# Patient Record
Sex: Male | Born: 1960
Health system: Southern US, Community
[De-identification: ages and names within clinical notes are randomized; demographics above are authoritative.]

## PROBLEM LIST (undated history)

## (undated) ENCOUNTER — Emergency Department (HOSPITAL_COMMUNITY): Discharge status: Against Medical Advice | Payer: Managed Care, Other (non HMO)

## (undated) DIAGNOSIS — M1990 Unspecified osteoarthritis, unspecified site: Secondary | ICD-10-CM

## (undated) DIAGNOSIS — E785 Hyperlipidemia, unspecified: Secondary | ICD-10-CM

## (undated) DIAGNOSIS — I10 Essential (primary) hypertension: Secondary | ICD-10-CM

## (undated) DIAGNOSIS — K8020 Calculus of gallbladder without cholecystitis without obstruction: Secondary | ICD-10-CM

## (undated) DIAGNOSIS — R972 Elevated prostate specific antigen [PSA]: Secondary | ICD-10-CM

## (undated) DIAGNOSIS — Z8719 Personal history of other diseases of the digestive system: Secondary | ICD-10-CM

## (undated) DIAGNOSIS — Z87442 Personal history of urinary calculi: Secondary | ICD-10-CM

## (undated) DIAGNOSIS — I499 Cardiac arrhythmia, unspecified: Secondary | ICD-10-CM

## (undated) DIAGNOSIS — G4730 Sleep apnea, unspecified: Secondary | ICD-10-CM

## (undated) DIAGNOSIS — K219 Gastro-esophageal reflux disease without esophagitis: Secondary | ICD-10-CM

## (undated) DIAGNOSIS — N200 Calculus of kidney: Secondary | ICD-10-CM

## (undated) DIAGNOSIS — G709 Myoneural disorder, unspecified: Secondary | ICD-10-CM

## (undated) DIAGNOSIS — C801 Malignant (primary) neoplasm, unspecified: Secondary | ICD-10-CM

## (undated) HISTORY — PX: LITHOTRIPSY: SUR834

## (undated) HISTORY — PX: CARDIAC CATHETERIZATION: SHX172

## (undated) HISTORY — PX: NASAL SINUS SURGERY: SHX719

---

## 2000-12-19 ENCOUNTER — Other Ambulatory Visit: Admission: RE | Admit: 2000-12-19 | Discharge: 2000-12-19 | Payer: Self-pay | Admitting: Gastroenterology

## 2000-12-19 ENCOUNTER — Encounter (INDEPENDENT_AMBULATORY_CARE_PROVIDER_SITE_OTHER): Payer: Self-pay | Admitting: Specialist

## 2000-12-26 ENCOUNTER — Encounter: Payer: Self-pay | Admitting: Gastroenterology

## 2000-12-26 ENCOUNTER — Ambulatory Visit (HOSPITAL_COMMUNITY): Admission: RE | Admit: 2000-12-26 | Discharge: 2000-12-26 | Payer: Self-pay | Admitting: Gastroenterology

## 2003-08-03 ENCOUNTER — Ambulatory Visit (HOSPITAL_COMMUNITY): Admission: RE | Admit: 2003-08-03 | Discharge: 2003-08-03 | Payer: Self-pay | Admitting: Pulmonary Disease

## 2003-08-11 ENCOUNTER — Ambulatory Visit (HOSPITAL_COMMUNITY): Admission: RE | Admit: 2003-08-11 | Discharge: 2003-08-11 | Payer: Self-pay | Admitting: Pulmonary Disease

## 2005-10-03 ENCOUNTER — Ambulatory Visit (HOSPITAL_COMMUNITY): Admission: RE | Admit: 2005-10-03 | Discharge: 2005-10-03 | Payer: Self-pay | Admitting: Otolaryngology

## 2005-10-22 ENCOUNTER — Ambulatory Visit (HOSPITAL_COMMUNITY): Admission: RE | Admit: 2005-10-22 | Discharge: 2005-10-22 | Payer: Self-pay | Admitting: Otolaryngology

## 2005-11-15 ENCOUNTER — Ambulatory Visit (HOSPITAL_COMMUNITY): Admission: RE | Admit: 2005-11-15 | Discharge: 2005-11-15 | Payer: Self-pay | Admitting: Otolaryngology

## 2005-11-22 ENCOUNTER — Ambulatory Visit (HOSPITAL_COMMUNITY): Admission: RE | Admit: 2005-11-22 | Discharge: 2005-11-22 | Payer: Self-pay | Admitting: Otolaryngology

## 2006-08-11 IMAGING — CT CT PARANASAL SINUSES LIMITED
1 series · 11 of 14 positions shown, 14 images · non-contrast
Comparison: none

HISTORY: Chronic recurrent sinusitis

[Series 988: — · axial · 0.35mm/px · z∈[-596,-486]mm · 11 of 14 slices shown, 14 images]
[im 2/14  brain]
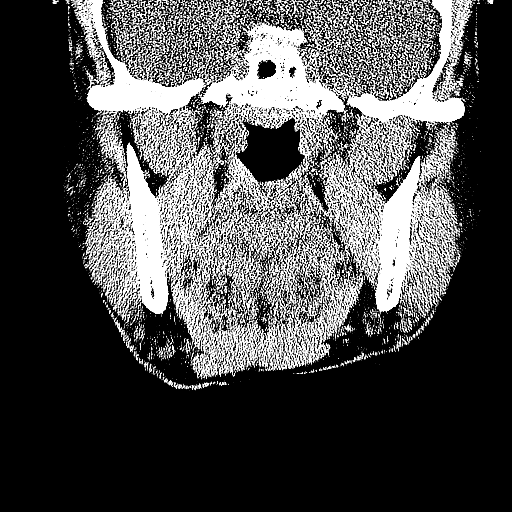
[im 2/14  bone]
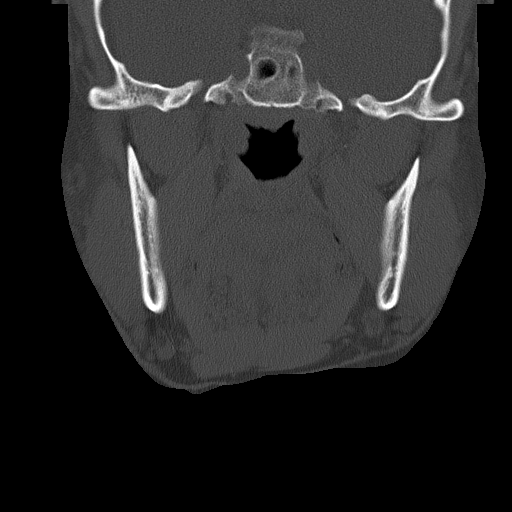
[im 3/14  bone]
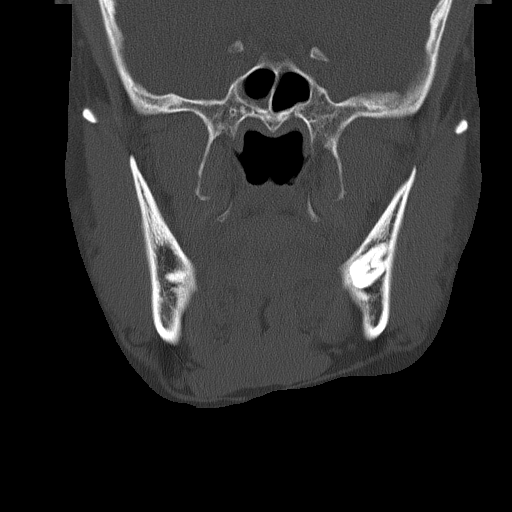
[im 4/14  bone]
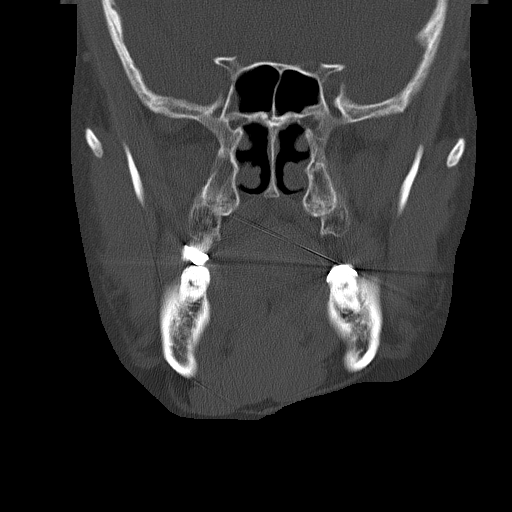
[im 5/14  bone]
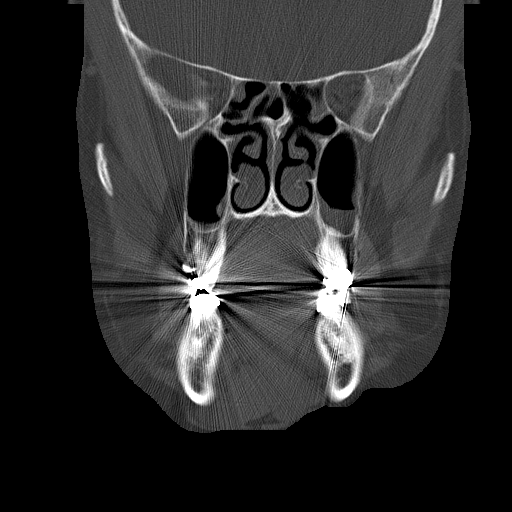
[im 6/14  brain]
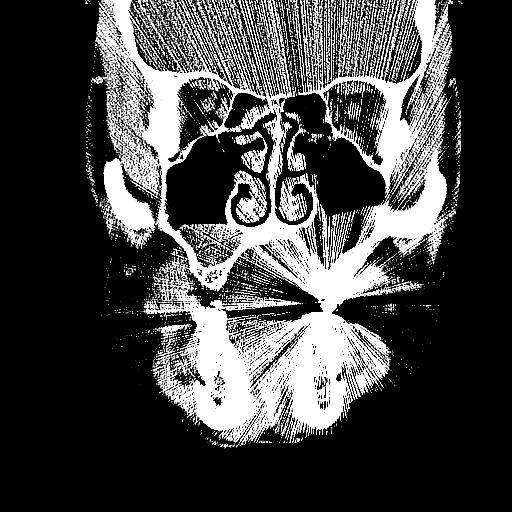
[im 6/14  bone]
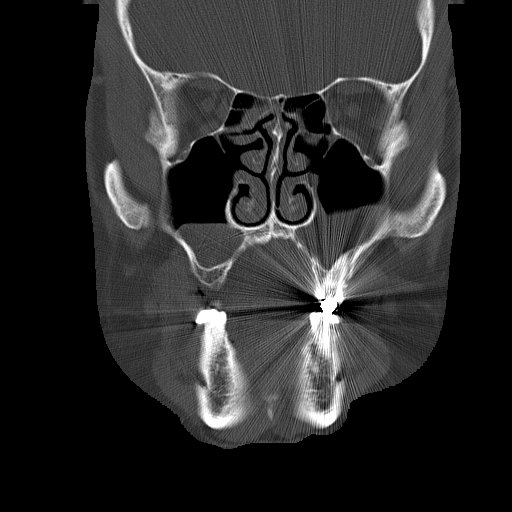
[im 7/14  bone]
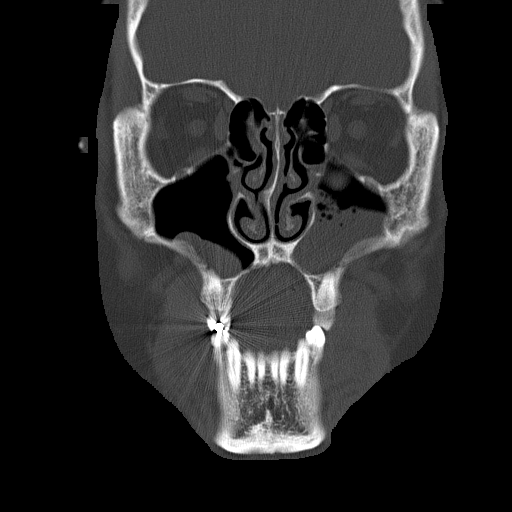
[im 8/14  bone]
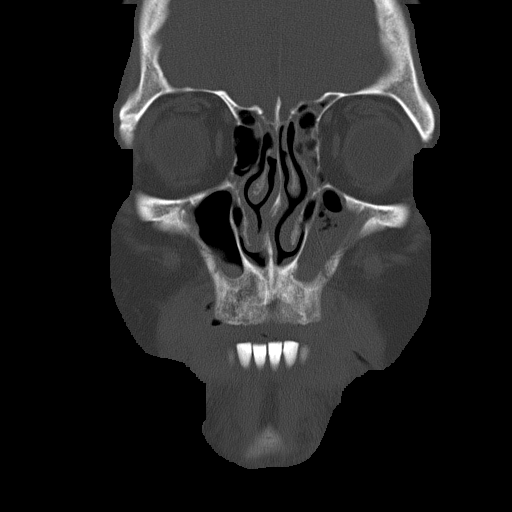
[im 9/14  bone]
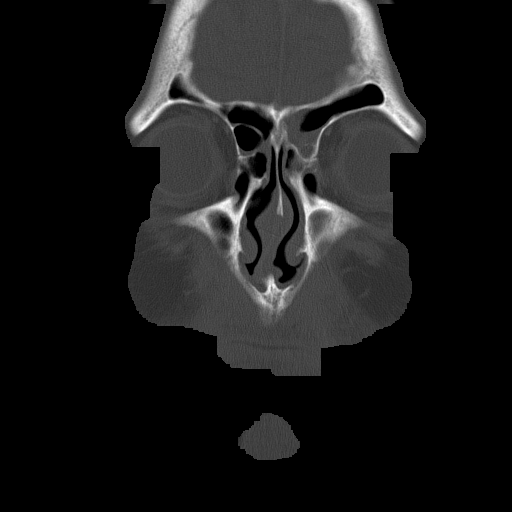
[im 10/14  brain]
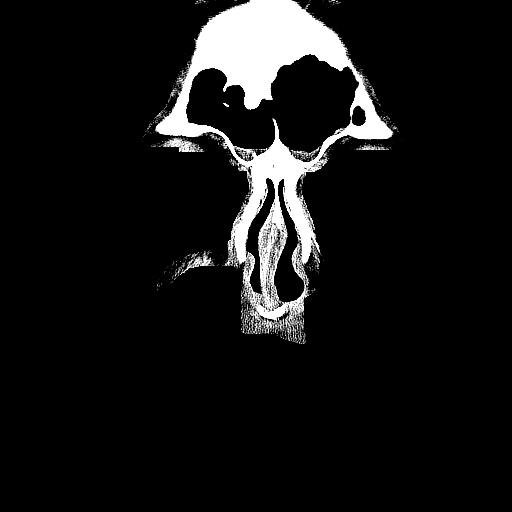
[im 10/14  bone]
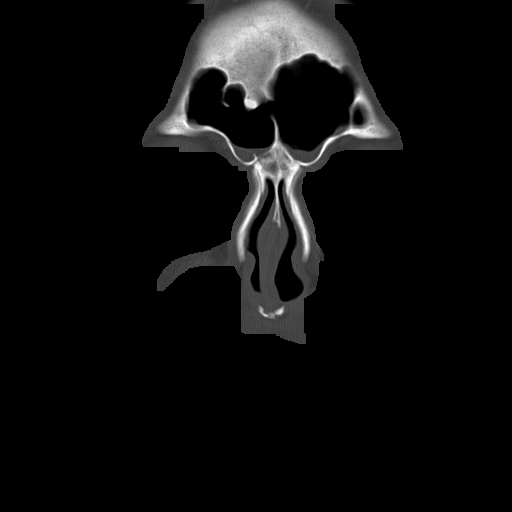
[im 11/14  bone]
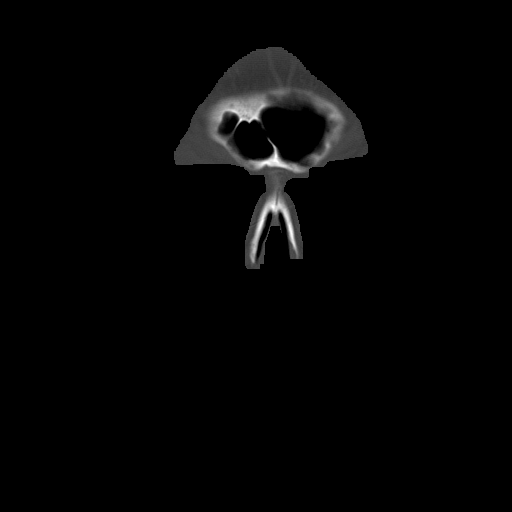
[im 13/14  bone]
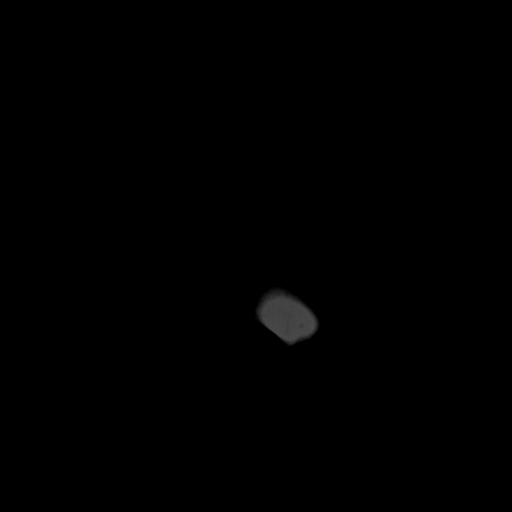

[11 of 14 positions shown; findings below may reference images not displayed]

CT SINUSES LIMITED WITHOUT CONTRAST:

Coronal non-contrast noncontiguous CT imaging paranasal sinuses compared to
10/22/2005.
Beam hardening artifacts from dental fillings.
Right side of face marked with BB.

Mucosal retention cyst the floor of right maxillary sinus.
Mucosal retention cyst left maxillary sinus with new air-fluid level compatible
with acute sinusitis.
Mucosal thickening in left ethmoid air cells and left frontal sinus with small
amounts of fluid dependently in frontal sinus bilaterally.
Nasal septum minimally deviated to left.
Sphenoid sinus with minimal dependent fluid.
No fracture or bone destruction.
IMPRESSION: Scattered sinus disease changes with fluid levels in frontal and left maxillary
sinus compatible with acute sinusitis.

## 2006-08-21 ENCOUNTER — Emergency Department (HOSPITAL_COMMUNITY): Admission: EM | Admit: 2006-08-21 | Discharge: 2006-08-21 | Payer: Self-pay | Admitting: Emergency Medicine

## 2010-06-06 ENCOUNTER — Emergency Department (HOSPITAL_BASED_OUTPATIENT_CLINIC_OR_DEPARTMENT_OTHER): Admission: EM | Admit: 2010-06-06 | Discharge: 2010-06-06 | Payer: Self-pay | Admitting: Emergency Medicine

## 2010-06-07 ENCOUNTER — Encounter (INDEPENDENT_AMBULATORY_CARE_PROVIDER_SITE_OTHER): Payer: Self-pay | Admitting: *Deleted

## 2010-06-21 ENCOUNTER — Telehealth: Payer: Self-pay | Admitting: Gastroenterology

## 2010-11-27 NOTE — Letter (Signed)
Summary: Colonoscopy Letter  [facility]  [address] [place], [place] [address]   Phone: [phone]  Fax: [phone]      June 07, 2010 MRN: [id number]   [name] [address] [place], [place]  [address]   Dear Mr. PHILLIPPI,   According to your medical record, it is time for you to schedule a Colonoscopy. The American Cancer Society recommends this procedure as a method to detect early colon cancer. Patients with a family history of colon cancer, or a personal history of colon polyps or inflammatory bowel disease are at increased risk.  This letter has beeen generated based on the recommendations made at the time of your procedure. If you feel that in your particular situation this may no longer apply, please contact our office.  Please call our office at [phone] to schedule this appointment or to update your records at your earliest convenience.  Thank you for cooperating with [place] to provide you with the very best care possible.   Sincerely,  Judie Petit T. Russella Dar, M.D.  [facility] [phone]

## 2010-11-27 NOTE — Progress Notes (Signed)
Summary: Schedule Colonoscopy  Phone Note Outgoing Call Call back at [facility] Phone [phone]   Call placed by: Harlow Mares CMA Duncan Dull),  June 21, 2010 3:14 PM Call placed to: Patient Summary of Call: Left a message on patients machine to call back. patient is due for his colonoscopy Initial call taken by: Harlow Mares CMA Duncan Dull),  June 21, 2010 3:14 PM  Follow-up for Phone Call        Left a message on the patient machine to call back and schedule a previsit and procedure with our office. A letter will be mailed to the patient.   Follow-up by: Harlow Mares CMA Duncan Dull),  June 28, 2010 2:58 PM

## 2010-12-11 ENCOUNTER — Encounter: Payer: Self-pay | Admitting: Internal Medicine

## 2010-12-19 NOTE — Letter (Signed)
Summary: [facility] TRIAGE  [facility] TRIAGE   Imported By: Rexene Alberts 12/11/2010 10:31:48  _____________________________________________________________________  External Attachment:    Type:   Image     Comment:   External Document  Appended Document: [facility] TRIAGE ok as is  Appended Document: [facility] TRIAGE Rx and instuctions mailed.

## 2010-12-24 ENCOUNTER — Encounter: Payer: Self-pay | Admitting: Internal Medicine

## 2010-12-24 ENCOUNTER — Ambulatory Visit (HOSPITAL_COMMUNITY)
Admission: RE | Admit: 2010-12-24 | Discharge: 2010-12-24 | Disposition: A | Discharge status: Home / Self Care | Payer: Managed Care, Other (non HMO) | Source: Ambulatory Visit | Attending: Internal Medicine | Admitting: Internal Medicine

## 2010-12-24 ENCOUNTER — Encounter: Payer: Managed Care, Other (non HMO) | Admitting: Internal Medicine

## 2010-12-24 DIAGNOSIS — Z1211 Encounter for screening for malignant neoplasm of colon: Secondary | ICD-10-CM

## 2010-12-24 DIAGNOSIS — Z8371 Family history of colonic polyps: Secondary | ICD-10-CM

## 2010-12-24 DIAGNOSIS — Z83719 Family history of colon polyps, unspecified: Secondary | ICD-10-CM | POA: Insufficient documentation

## 2011-01-02 NOTE — Op Note (Addendum)
  NAME:  Anthony Anderson, Anthony Anderson                ACCOUNT NO.:  [id number]  MEDICAL RECORD NO.:  [id number]           PATIENT TYPE:  O  LOCATION:  [facility]                          FACILITY:  [facility]  PHYSICIAN:  R. Roetta Sessions, M.D. DATE OF BIRTH:  [birth year]  DATE OF PROCEDURE:  12/24/2010 DATE OF DISCHARGE:                              OPERATIVE REPORT   INDICATIONS FOR PROCEDURE:  A 50 year old gentleman with no lower GI tract symptoms sent over at the courtesy Dr. Kari Baars for first ever screening colonoscopy.  Family history is significant and that his brother has history polyps and is in a surveillance program at a young age (less than 46).  Colonoscopy is now being done as a screening maneuver.  Risks, benefits, limitations, alternatives, and imponderables have been discussed and questions answered.  Please see the documentation in the medical record.  PROCEDURE NOTE:  O2 saturation, blood pressure, pulse, and respirations were monitored throughout the entire procedure.  CONSCIOUS SEDATION:  Versed 5 mg IV, Demerol 75 mg IV in divided doses.  INSTRUMENT:  Pentax video chip system.  FINDINGS:  Digital rectal exam revealed no abnormalities.  Endoscopic findings:  Prep was good.  Colon:  Colonic mucosa was surveyed from the rectosigmoid junction through the left transverse right colon to the appendiceal orifice, ileocecal valve/cecum.  These structures were well seen and photographed for record.  The terminal ileum was intubated to 5 cm.  From this level, the scope was slowly withdrawn.  All previously mentioned mucosal surfaces were again seen.  The colonic mucosa appeared normal as did the terminal mucosa.  Scope was pulled down into the rectum where thorough examination of the rectal mucosa including retroflexed view of the anal verge demonstrated no abnormalities.  The patient tolerated the procedure well and was reactive in endoscopy. Cecal withdrawal time 7  minutes.  IMPRESSION:  Normal rectum and terminal ileum.  RECOMMENDATIONS:  Repeat screening colonoscopy in 5 years.     Jonathon Bellows, M.D.     RMR/MEDQ  D:  12/24/2010  T:  12/24/2010  Job:  [id number]  cc:   Ramon Dredge L. Juanetta Gosling, M.D. Fax: [phone]  Electronically Signed by Lorrin Goodell M.D. on 01/01/2011 02:42:05 PM Electronically Signed by Lorrin Goodell M.D. on 01/01/2011 03:17:39 PM Electronically Signed by Lorrin Goodell M.D. on 01/01/2011 21:30:86 PM Electronically Signed by Lorrin Goodell M.D. on 01/01/2011 04:19:06 PM Electronically Signed by Lorrin Goodell M.D. on 01/01/2011 04:50:14 PM Electronically Signed by Lorrin Goodell M.D. on 01/01/2011 04:50:14 PM Electronically Signed by Lorrin Goodell M.D. on 01/01/2011 07:36:58 PM

## 2011-09-20 DIAGNOSIS — K219 Gastro-esophageal reflux disease without esophagitis: Secondary | ICD-10-CM | POA: Insufficient documentation

## 2011-09-20 DIAGNOSIS — Z79899 Other long term (current) drug therapy: Secondary | ICD-10-CM | POA: Insufficient documentation

## 2011-09-20 DIAGNOSIS — I10 Essential (primary) hypertension: Secondary | ICD-10-CM | POA: Insufficient documentation

## 2011-09-20 DIAGNOSIS — L03019 Cellulitis of unspecified finger: Secondary | ICD-10-CM | POA: Insufficient documentation

## 2011-09-20 DIAGNOSIS — F17200 Nicotine dependence, unspecified, uncomplicated: Secondary | ICD-10-CM | POA: Insufficient documentation

## 2011-09-20 DIAGNOSIS — E785 Hyperlipidemia, unspecified: Secondary | ICD-10-CM | POA: Insufficient documentation

## 2011-09-21 ENCOUNTER — Emergency Department (HOSPITAL_BASED_OUTPATIENT_CLINIC_OR_DEPARTMENT_OTHER)
Admission: EM | Admit: 2011-09-21 | Discharge: 2011-09-21 | Disposition: A | Discharge status: Home / Self Care | Payer: Managed Care, Other (non HMO) | Attending: Emergency Medicine | Admitting: Emergency Medicine

## 2011-09-21 DIAGNOSIS — L03011 Cellulitis of right finger: Secondary | ICD-10-CM

## 2011-09-21 HISTORY — DX: Essential (primary) hypertension: I10

## 2011-09-21 HISTORY — DX: Hyperlipidemia, unspecified: E78.5

## 2011-09-21 HISTORY — DX: Gastro-esophageal reflux disease without esophagitis: K21.9

## 2011-09-21 HISTORY — DX: Calculus of kidney: N20.0

## 2011-09-21 MED ORDER — KETOROLAC TROMETHAMINE 60 MG/2ML IM SOLN
INTRAMUSCULAR | Status: AC
  Administered 2011-09-21: 60 mg via INTRAMUSCULAR
  Filled 2011-09-21: qty 2

## 2011-09-21 MED ORDER — LIDOCAINE HCL (PF) 1 % IJ SOLN
INTRAMUSCULAR | Status: AC
  Administered 2011-09-21: 5 mL via SUBCUTANEOUS
  Filled 2011-09-21: qty 10

## 2011-09-21 MED ORDER — DOXYCYCLINE HYCLATE 100 MG PO TABS
100.0000 mg | ORAL_TABLET | Freq: Once | ORAL | Status: AC
Start: 2011-09-21 — End: 2011-09-21
  Administered 2011-09-21: 100 mg via ORAL
  Filled 2011-09-21: qty 1

## 2011-09-21 MED ORDER — DOXYCYCLINE HYCLATE 100 MG PO CAPS: 100.0000 mg | ORAL_CAPSULE | Freq: Two times a day (BID) | ORAL | Status: AC

## 2011-09-21 MED ORDER — KETOROLAC TROMETHAMINE 60 MG/2ML IM SOLN
60.0000 mg | Freq: Once | INTRAMUSCULAR | Status: AC
  Administered 2011-09-21: 60 mg via INTRAMUSCULAR

## 2011-09-21 MED ORDER — NAPROXEN 500 MG PO TABS: 500.0000 mg | ORAL_TABLET | Freq: Two times a day (BID) | ORAL | Status: DC

## 2011-09-21 MED ORDER — LIDOCAINE HCL (PF) 1 % IJ SOLN
5.0000 mL | Freq: Once | INTRAMUSCULAR | Status: AC
  Administered 2011-09-21: 5 mL via SUBCUTANEOUS

## 2011-09-21 NOTE — ED Provider Notes (Signed)
History     CSN: [id number] Arrival date & time: 09/21/2011 12:28 AM   First MD Initiated Contact with Patient 09/21/11 0126      Chief Complaint  Patient presents with  . Finger Injury    (Consider location/radiation/quality/duration/timing/severity/associated sxs/prior treatment) HPI Comments: Patient complains of pain to the right finger times several days. This is constant, associated with swelling, not associated with fevers, chills or vomiting, stones or worse with palpation but he has had some improvement now that this has started draining under his nail fold of the right pinky. Toes are moderate at this time.  Patient states that he works on [employer] and gets frequent small cuts on the ends of his fingers. Prior to arrival he states that the finger drained a moderate amount of puslike discharge  The history is provided by the patient.    Past Medical History  Diagnosis Date  . Kidney stones   . Hypertension   . Hyperlipidemia   . GERD (gastroesophageal reflux disease)     Past Surgical History  Procedure Date  . Lithotripsy     History reviewed. No pertinent family history.  History  Substance Use Topics  . Smoking status: Former Smoker -- 1.0 packs/day for 10 years    Types: Cigarettes    Quit date: 09/20/1982  . Smokeless tobacco: Never Used  . Alcohol Use: No      Review of Systems  Constitutional: Negative for fever and chills.  Gastrointestinal: Negative for nausea and vomiting.  Skin: Positive for rash.       Abscess - paronychia right small finger    Allergies  Review of patient's allergies indicates no known allergies.  Home Medications   Current Outpatient Rx  Name Route Sig Dispense Refill  . ESOMEPRAZOLE MAGNESIUM 40 MG PO CPDR Oral Take 40 mg by mouth daily before breakfast.      . OLMESARTAN MEDOXOMIL-HCTZ 20-12.5 MG PO TABS Oral Take 1 tablet by mouth daily.      Marland Kitchen ROSUVASTATIN CALCIUM 10 MG PO TABS Oral Take 10 mg by mouth daily.       Marland Kitchen DOXYCYCLINE HYCLATE 100 MG PO CAPS Oral Take 1 capsule (100 mg total) by mouth 2 (two) times daily. 20 capsule 0  . NAPROXEN 500 MG PO TABS Oral Take 1 tablet (500 mg total) by mouth 2 (two) times daily with a meal. 30 tablet 0    BP 187/104  Pulse 80  Temp(Src) 98 F (36.7 C) (Oral)  Resp 17  Ht 5\' 10"  (1.778 m)  Wt 220 lb (99.791 kg)  BMI 31.57 kg/m2  SpO2 98%  Physical Exam  Nursing note and vitals reviewed. Constitutional: He appears well-developed and well-nourished. No distress.  HENT:  Head: Normocephalic and atraumatic.  Eyes: Conjunctivae are normal. Right eye exhibits no discharge. Left eye exhibits no discharge. No scleral icterus.  Cardiovascular: Normal rate and regular rhythm.   No murmur heard. Pulmonary/Chest: Effort normal and breath sounds normal.  Musculoskeletal: He exhibits tenderness. He exhibits no edema.       Tenderness to the distal right fifth finger, purulent drainage from underneath the nail fold, no subungual hematoma or abscess, no felon  Skin: Skin is warm and dry. He is not diaphoretic.       Paronychia to the right pinky finger    ED Course  INCISION AND DRAINAGE Date/Time: 09/21/2011 2:15 AM Performed by: Eber Hong D Authorized by: Eber Hong D Consent: Verbal consent obtained. Risks and benefits: risks,  benefits and alternatives were discussed Consent given by: patient Patient understanding: patient states understanding of the procedure being performed Patient consent: the patient's understanding of the procedure matches consent given Procedure consent: procedure consent matches procedure scheduled Relevant documents: relevant documents present and verified Test results: test results available and properly labeled Imaging studies: imaging studies available Patient identity confirmed: verbally with patient and arm band Time out: Immediately prior to procedure a "time out" was called to verify the correct patient, procedure,  equipment, support staff and site/side marked as required. Indications for incision and drainage: Paronychia. Location: Right fifth finger. Anesthesia: digital block Local anesthetic: lidocaine 1% without epinephrine Anesthetic total: 4 ml Patient sedated: no Incision type: Scissors used to drain from underneath the nail fold. Drainage: purulent and bloody Drainage amount: scant Wound treatment: wound left open Patient tolerance: Patient tolerated the procedure well with no immediate complications.   (including critical care time)  Labs Reviewed - No data to display No results found.   1. Paronychia of fifth finger, right       MDM  Primary drainage of paronychia after digital block. No fever or vomiting, otherwise patient appears well there does not appear to be any spread of redness or erythema up the arm and no a sitting lymphangitis present      Improved after digital block and paronychia drainage, doxycycline given in the emergency department and prescription given for home.  Vida Roller, MD 09/21/11 [phone]

## 2011-09-21 NOTE — ED Notes (Signed)
Pt states that he first noticed tenderness to the R little finger on Wednesday, swelling and pain began on Thursday and pain is now extending up his arm.  Pt states that finger started draining today while working.  No causative injury noted.

## 2012-01-27 ENCOUNTER — Encounter: Payer: Self-pay | Admitting: Gastroenterology

## 2012-05-23 ENCOUNTER — Emergency Department (HOSPITAL_COMMUNITY)
Admission: EM | Admit: 2012-05-23 | Discharge: 2012-05-23 | Disposition: A | Discharge status: Home / Self Care | Payer: Managed Care, Other (non HMO) | Attending: Emergency Medicine | Admitting: Emergency Medicine

## 2012-05-23 ENCOUNTER — Encounter (HOSPITAL_COMMUNITY): Payer: Self-pay

## 2012-05-23 ENCOUNTER — Emergency Department (HOSPITAL_COMMUNITY): Payer: Managed Care, Other (non HMO)

## 2012-05-23 DIAGNOSIS — Z87891 Personal history of nicotine dependence: Secondary | ICD-10-CM | POA: Insufficient documentation

## 2012-05-23 DIAGNOSIS — K219 Gastro-esophageal reflux disease without esophagitis: Secondary | ICD-10-CM | POA: Insufficient documentation

## 2012-05-23 DIAGNOSIS — N200 Calculus of kidney: Secondary | ICD-10-CM | POA: Insufficient documentation

## 2012-05-23 DIAGNOSIS — E785 Hyperlipidemia, unspecified: Secondary | ICD-10-CM | POA: Insufficient documentation

## 2012-05-23 DIAGNOSIS — I10 Essential (primary) hypertension: Secondary | ICD-10-CM | POA: Insufficient documentation

## 2012-05-23 LAB — URINALYSIS, ROUTINE W REFLEX MICROSCOPIC
Glucose, UA: NEGATIVE mg/dL
Ketones, ur: NEGATIVE mg/dL
Nitrite: NEGATIVE
Specific Gravity, Urine: >1.03 — ABNORMAL HIGH (ref 1.005–1.030)
pH: 5.5 (ref 5.0–8.0)

## 2012-05-23 LAB — CBC WITH DIFFERENTIAL/PLATELET
Basophils Relative: 0 % (ref 0–1)
Eosinophils Relative: 0 % (ref 0–5)
HCT: 45.6 % (ref 39.0–52.0)
Hemoglobin: 16 g/dL (ref 13.0–17.0)
MCHC: 35.1 g/dL (ref 30.0–36.0)
MCV: 84.9 fL (ref 78.0–100.0)
Neutro Abs: 11.4 10*3/uL — ABNORMAL HIGH (ref 1.7–7.7)
Neutrophils Relative %: 82 % — ABNORMAL HIGH (ref 43–77)
RDW: 12.7 % (ref 11.5–15.5)
WBC: 13.8 10*3/uL — ABNORMAL HIGH (ref 4.0–10.5)

## 2012-05-23 LAB — URINE MICROSCOPIC-ADD ON

## 2012-05-23 LAB — BASIC METABOLIC PANEL
BUN: 16 mg/dL (ref 6–23)
CO2: 24 mEq/L (ref 19–32)
Calcium: 9.4 mg/dL (ref 8.4–10.5)
Chloride: 104 mEq/L (ref 96–112)
GFR calc Af Amer: 58 mL/min — ABNORMAL LOW (ref >90)
GFR calc non Af Amer: 50 mL/min — ABNORMAL LOW (ref >90)
Glucose, Bld: 137 mg/dL — ABNORMAL HIGH (ref 70–99)
Potassium: 4.3 mEq/L (ref 3.5–5.1)

## 2012-05-23 MED ORDER — TAMSULOSIN HCL 0.4 MG PO CAPS: 0.4000 mg | ORAL_CAPSULE | Freq: Every day | ORAL | Status: DC

## 2012-05-23 MED ORDER — ONDANSETRON HCL 4 MG/2ML IJ SOLN
4.0000 mg | Freq: Once | INTRAMUSCULAR | Status: AC
  Administered 2012-05-23: 4 mg via INTRAVENOUS
  Filled 2012-05-23: qty 2

## 2012-05-23 MED ORDER — PROMETHAZINE HCL 25 MG PO TABS: 25.0000 mg | ORAL_TABLET | Freq: Four times a day (QID) | ORAL | Status: DC | PRN

## 2012-05-23 MED ORDER — KETOROLAC TROMETHAMINE 30 MG/ML IJ SOLN
30.0000 mg | Freq: Once | INTRAMUSCULAR | Status: AC
  Administered 2012-05-23: 30 mg via INTRAVENOUS
  Filled 2012-05-23: qty 1

## 2012-05-23 MED ORDER — PROMETHAZINE HCL 25 MG/ML IJ SOLN
25.0000 mg | Freq: Once | INTRAMUSCULAR | Status: AC
  Administered 2012-05-23: 25 mg via INTRAVENOUS
  Filled 2012-05-23: qty 1

## 2012-05-23 MED ORDER — HYDROMORPHONE HCL PF 1 MG/ML IJ SOLN
1.0000 mg | Freq: Once | INTRAMUSCULAR | Status: AC
  Administered 2012-05-23: 1 mg via INTRAVENOUS
  Filled 2012-05-23: qty 1

## 2012-05-23 MED ORDER — OXYCODONE-ACETAMINOPHEN 5-325 MG PO TABS: ORAL_TABLET | ORAL | Status: DC

## 2012-05-23 MED ORDER — METOCLOPRAMIDE HCL 5 MG/ML IJ SOLN
10.0000 mg | Freq: Once | INTRAMUSCULAR | Status: AC
Start: 2012-05-23 — End: 2012-05-23
  Administered 2012-05-23: 10 mg via INTRAVENOUS
  Filled 2012-05-23: qty 2

## 2012-05-23 NOTE — ED Notes (Signed)
Patient states he is still at a pain scale of 8. RN Tammy Sours aware.

## 2012-05-23 NOTE — ED Notes (Signed)
Right flank pain off/on for 1 week, pain worse today, now vomiting

## 2012-05-23 NOTE — ED Provider Notes (Signed)
History     CSN: [id number]  Arrival date & time 05/23/12  4098   First MD Initiated Contact with Patient 05/23/12 1008      Chief Complaint  Patient presents with  . Flank Pain    (Consider location/radiation/quality/duration/timing/severity/associated sxs/prior treatment) Patient is a 51 y.o. male presenting with flank pain. The history is provided by the patient.  Flank Pain This is a new problem. The current episode started 1 to 4 weeks ago. The problem occurs constantly. The problem has been gradually worsening. Associated symptoms include nausea and vomiting. Pertinent negatives include no abdominal pain, arthralgias, chest pain, chills, coughing, fever, myalgias, neck pain, numbness, rash, sore throat or weakness. Exacerbated by: urination. He has tried nothing for the symptoms. The treatment provided no relief.    Past Medical History  Diagnosis Date  . Kidney stones   . Hypertension   . Hyperlipidemia   . GERD (gastroesophageal reflux disease)     Past Surgical History  Procedure Date  . Lithotripsy     No family history on file.  History  Substance Use Topics  . Smoking status: Former Smoker -- 1.0 packs/day for 10 years    Types: Cigarettes    Quit date: 09/20/1982  . Smokeless tobacco: Never Used  . Alcohol Use: No      Review of Systems  Constitutional: Negative for fever, chills and appetite change.  HENT: Negative for sore throat, trouble swallowing, neck pain and neck stiffness.   Respiratory: Negative for cough, chest tightness, shortness of breath and wheezing.   Cardiovascular: Negative for chest pain and palpitations.  Gastrointestinal: Positive for nausea and vomiting. Negative for abdominal pain, blood in stool and abdominal distention.  Genitourinary: Positive for flank pain, decreased urine volume and difficulty urinating. Negative for dysuria, urgency, hematuria, scrotal swelling and testicular pain.  Musculoskeletal: Negative for  myalgias, back pain and arthralgias.  Skin: Negative for rash.  Neurological: Negative for dizziness, weakness and numbness.  Hematological: Does not bruise/bleed easily.  All other systems reviewed and are negative.    Allergies  Review of patient's allergies indicates no known allergies.  Home Medications   Current Outpatient Rx  Name Route Sig Dispense Refill  . ESOMEPRAZOLE MAGNESIUM 40 MG PO CPDR Oral Take 40 mg by mouth daily before breakfast.     . OLMESARTAN MEDOXOMIL 40 MG PO TABS Oral Take 40 mg by mouth daily.    Marland Kitchen ROSUVASTATIN CALCIUM 10 MG PO TABS Oral Take 10 mg by mouth daily.       BP 140/93  Pulse 72  Temp 98.2 F (36.8 C) (Oral)  Resp 20  Ht 5\' 10"  (1.778 m)  Wt 225 lb (102.059 kg)  BMI 32.28 kg/m2  SpO2 96%  Physical Exam  Nursing note and vitals reviewed. Constitutional: He is oriented to person, place, and time. He appears well-developed and well-nourished. No distress.  HENT:  Head: Normocephalic and atraumatic.  Mouth/Throat: Oropharynx is clear and moist.  Neck: Normal range of motion. Neck supple.  Cardiovascular: Normal rate, regular rhythm and normal heart sounds.   Pulmonary/Chest: Effort normal and breath sounds normal. No respiratory distress. He exhibits no tenderness.  Abdominal: Soft. He exhibits no distension. There is no tenderness. There is no rigidity, no rebound, no guarding, no CVA tenderness and no tenderness at McBurney's point.       Pain to the right flank area w/o CVA tenderness.  Musculoskeletal: Normal range of motion. He exhibits no edema and no tenderness.  Lymphadenopathy:    He has no cervical adenopathy.  Neurological: He is alert and oriented to person, place, and time. He exhibits normal muscle tone. Coordination normal.  Skin: Skin is warm and dry.    ED Course  Procedures (including critical care time)  Labs Reviewed  URINALYSIS, ROUTINE W REFLEX MICROSCOPIC - Abnormal; Notable for the following:    Specific  Gravity, Urine >1.030 (*)     Hgb urine dipstick LARGE (*)     Protein, ur 30 (*)     All other components within normal limits  CBC WITH DIFFERENTIAL - Abnormal; Notable for the following:    WBC 13.8 (*)     Neutrophils Relative 82 (*)     Neutro Abs 11.4 (*)     Lymphocytes Relative 11 (*)     All other components within normal limits  BASIC METABOLIC PANEL - Abnormal; Notable for the following:    Glucose, Bld 137 (*)     Creatinine, Ser 1.56 (*)     GFR calc non Af Amer 50 (*)     GFR calc Af Amer 58 (*)     All other components within normal limits  URINE MICROSCOPIC-ADD ON - Abnormal; Notable for the following:    Crystals CA OXALATE CRYSTALS (*)     All other components within normal limits   Ct Abdomen Pelvis Wo Contrast  05/23/2012  *RADIOLOGY REPORT*  Clinical Data: 51 year old male with right flank, abdominal and pelvic pain.  CT ABDOMEN AND PELVIS WITHOUT CONTRAST  Technique:  Multidetector CT imaging of the abdomen and pelvis was performed following the standard protocol without intravenous contrast.  Comparison: None  Findings: Mild to moderate right hydroureteronephrosis is caused by a 4 mm calculus at the right UVJ. Tiny nonobstructing right renal calculi are present. A probable left renal cyst is noted.  A large hiatal hernia is present. Mild fatty infiltration of the liver is identified. The spleen, pancreas and adrenal glands are unremarkable. Cholelithiasis identified without CT evidence of cholecystitis. Please note that parenchymal abnormalities may be missed as intravenous contrast was not administered.  No free fluid, enlarged lymph nodes, biliary dilation or abdominal aortic aneurysm identified.  The bowel is within normal limits. Prostate enlargement is identified.  No acute or suspicious bony abnormalities are identified.  IMPRESSION: 4 mm right UVJ calculus causing mild to moderate right hydroureteronephrosis.  Punctate nonobstructing right renal calculi.   Cholelithiasis without CT evidence of cholecystitis.  Large hiatal hernia.  Mild fatty infiltration of the liver.  Original Report Authenticated By: Rosendo Gros, M.D.        MDM    Several doses of IV dilaudid given with only temporary improvement of symptoms.  Patient's pain improved significantly after IV toradol.  Now rating pain level at "2".  Nausea resolved.  No further vomiting.  Right sided 4 mm kidney stone with h/o same.    Pt also seen by EDP and care plan discussed.    Will give medications for home use, urine strainers.  Pt agrees to close f/u on Monday with Dr. Jerre Simon.  He also agrees to return here if the sx's worsen.  The patient appears reasonably screened and/or stabilized for discharge and I doubt any other medical condition or other [facility] requiring further screening, evaluation, or treatment in the ED at this time prior to discharge.   Prescribed:  Phenergan flomax Percocet #24      Maher Shon L. Roneka Gilpin, PA 05/28/12 2200

## 2012-05-23 NOTE — ED Notes (Signed)
Pt c/o rt flank pain intermittently x one week. Pt states the vomiting started today.

## 2012-05-23 NOTE — ED Notes (Signed)
RN at bedside

## 2012-05-27 ENCOUNTER — Other Ambulatory Visit (HOSPITAL_COMMUNITY): Payer: Self-pay | Admitting: Urology

## 2012-05-27 DIAGNOSIS — N201 Calculus of ureter: Secondary | ICD-10-CM

## 2012-06-03 NOTE — ED Provider Notes (Signed)
Medical screening examination/treatment/procedure(s) were conducted as a shared visit with non-physician practitioner(s) and myself.  I personally evaluated the patient during the encounter.  History and physical consistent with a right-sided ureterolithiasis.  Patient stable at discharge. Followup with urology.  Donnetta Hutching, MD 06/03/12 1451

## 2012-06-09 ENCOUNTER — Ambulatory Visit (HOSPITAL_COMMUNITY): Payer: Managed Care, Other (non HMO)

## 2012-07-01 ENCOUNTER — Encounter: Payer: Self-pay | Admitting: Gastroenterology

## 2013-03-02 ENCOUNTER — Ambulatory Visit (HOSPITAL_COMMUNITY)
Admission: RE | Admit: 2013-03-02 | Discharge: 2013-03-02 | Disposition: A | Discharge status: Home / Self Care | Payer: Managed Care, Other (non HMO) | Source: Ambulatory Visit | Attending: Urology | Admitting: Urology

## 2013-03-02 DIAGNOSIS — Z09 Encounter for follow-up examination after completed treatment for conditions other than malignant neoplasm: Secondary | ICD-10-CM | POA: Insufficient documentation

## 2013-03-02 DIAGNOSIS — N201 Calculus of ureter: Secondary | ICD-10-CM

## 2013-03-02 DIAGNOSIS — K8020 Calculus of gallbladder without cholecystitis without obstruction: Secondary | ICD-10-CM | POA: Insufficient documentation

## 2013-10-28 DIAGNOSIS — R0600 Dyspnea, unspecified: Secondary | ICD-10-CM

## 2013-10-28 HISTORY — DX: Dyspnea, unspecified: R06.00

## 2014-10-23 ENCOUNTER — Encounter (HOSPITAL_COMMUNITY): Payer: Self-pay

## 2014-10-23 ENCOUNTER — Emergency Department (HOSPITAL_COMMUNITY): Payer: BC Managed Care – PPO

## 2014-10-23 ENCOUNTER — Inpatient Hospital Stay (HOSPITAL_COMMUNITY)
Admission: EM | Admit: 2014-10-23 | Discharge: 2014-10-27 | DRG: 287 | Disposition: A | Discharge status: Home / Self Care | Payer: BC Managed Care – PPO | Attending: Pulmonary Disease | Admitting: Pulmonary Disease

## 2014-10-23 DIAGNOSIS — I6529 Occlusion and stenosis of unspecified carotid artery: Secondary | ICD-10-CM | POA: Diagnosis present

## 2014-10-23 DIAGNOSIS — R0602 Shortness of breath: Secondary | ICD-10-CM | POA: Diagnosis not present

## 2014-10-23 DIAGNOSIS — Z7982 Long term (current) use of aspirin: Secondary | ICD-10-CM

## 2014-10-23 DIAGNOSIS — I10 Essential (primary) hypertension: Secondary | ICD-10-CM | POA: Diagnosis present

## 2014-10-23 DIAGNOSIS — R0600 Dyspnea, unspecified: Secondary | ICD-10-CM | POA: Diagnosis present

## 2014-10-23 DIAGNOSIS — R079 Chest pain, unspecified: Secondary | ICD-10-CM | POA: Diagnosis present

## 2014-10-23 DIAGNOSIS — I2510 Atherosclerotic heart disease of native coronary artery without angina pectoris: Secondary | ICD-10-CM | POA: Diagnosis not present

## 2014-10-23 DIAGNOSIS — E785 Hyperlipidemia, unspecified: Secondary | ICD-10-CM | POA: Diagnosis present

## 2014-10-23 DIAGNOSIS — R000 Tachycardia, unspecified: Secondary | ICD-10-CM | POA: Diagnosis present

## 2014-10-23 DIAGNOSIS — Z87891 Personal history of nicotine dependence: Secondary | ICD-10-CM

## 2014-10-23 DIAGNOSIS — K219 Gastro-esophageal reflux disease without esophagitis: Secondary | ICD-10-CM | POA: Diagnosis present

## 2014-10-23 DIAGNOSIS — R55 Syncope and collapse: Secondary | ICD-10-CM | POA: Diagnosis present

## 2014-10-23 HISTORY — DX: Elevated prostate specific antigen (PSA): R97.20

## 2014-10-23 HISTORY — DX: Calculus of gallbladder without cholecystitis without obstruction: K80.20

## 2014-10-23 NOTE — ED Notes (Signed)
12 lead EKG completed.

## 2014-10-23 NOTE — ED Provider Notes (Signed)
CSN: [id number]     Arrival date & time 10/23/14  2020 History   This chart was scribed for Anthony Hamburger, MD by Randa Evens, ED Scribe. This patient was seen in room APA08/APA08 and the patient's care was started at 11:35 PM.     Chief Complaint  Patient presents with  . Shortness of Breath   Patient is a 53 y.o. male presenting with shortness of breath. The history is provided by the patient. No language interpreter was used.  Shortness of Breath Associated symptoms: chest pain    HPI Comments: Anthony Anderson is a 53 y.o. male with PMHx HTN and hyperlipidemia who presents to the Emergency Department complaining of intermittent improving SOB onset today at 12 PM. Pt states that he had associated light headedness, palpitations and chest tightness. Pt states that leaning forward slightly improved his symptoms. Pt states that leaning backwards made his symptoms worse. Pt states that he has recently been working outside building a deck. Denies leg swelling, pain with breathing or any other related symptoms. Father and mother has a Hx of cardiac disease at ages greater than 27.   Past Medical History  Diagnosis Date  . Kidney stones   . Hypertension   . Hyperlipidemia   . GERD (gastroesophageal reflux disease)    Past Surgical History  Procedure Laterality Date  . Lithotripsy     History reviewed. No pertinent family history. History  Substance Use Topics  . Smoking status: Former Smoker -- 1.00 packs/day for 10 years    Types: Cigarettes    Quit date: 09/20/1982  . Smokeless tobacco: Never Used  . Alcohol Use: No    Review of Systems  Respiratory: Positive for chest tightness and shortness of breath.   Cardiovascular: Positive for chest pain and palpitations. Negative for leg swelling.  Neurological: Positive for light-headedness.  All other systems reviewed and are negative.    Allergies  Review of patient's allergies indicates no known allergies.  Home Medications    Prior to Admission medications   Medication Sig Start Date End Date Taking? Authorizing Provider  acetaminophen (TYLENOL) 500 MG tablet Take 1,000 mg by mouth every 6 (six) hours as needed for mild pain or moderate pain.   Yes Historical Provider, MD  aspirin EC 81 MG tablet Take 81 mg by mouth once.   Yes Historical Provider, MD  esomeprazole (NEXIUM) 40 MG capsule Take 40 mg by mouth daily before breakfast.    Yes Historical Provider, MD  Misc Natural Products (Juda) Take 1 tablet by mouth daily.   Yes Historical Provider, MD  Multiple Vitamin (MULTIVITAMIN WITH MINERALS) TABS tablet Take 1 tablet by mouth daily.   Yes Historical Provider, MD  olmesartan (BENICAR) 40 MG tablet Take 40 mg by mouth daily.   Yes Historical Provider, MD  rosuvastatin (CRESTOR) 10 MG tablet Take 10 mg by mouth daily.    Yes Historical Provider, MD  oxyCODONE-acetaminophen (PERCOCET/ROXICET) 5-325 MG per tablet Take one or two tabs po q 4 hrs prn pain Patient not taking: Reported on 10/23/2014 05/23/12   Tammy L. Triplett, PA-C  promethazine (PHENERGAN) 25 MG tablet Take 1 tablet (25 mg total) by mouth every 6 (six) hours as needed for nausea. 05/23/12 05/30/12  Tammy L. Triplett, PA-C  Tamsulosin HCl (FLOMAX) 0.4 MG CAPS Take 1 capsule (0.4 mg total) by mouth daily. Patient not taking: Reported on 10/23/2014 05/23/12   Tammy L. Triplett, PA-C   Triage Vitals: BP  158/85 mmHg  Pulse 85  Temp(Src) 98.2 F (36.8 C) (Oral)  Resp 20  Ht 5\' 10"  (1.778 m)  Wt 229 lb (103.874 kg)  BMI 32.86 kg/m2  SpO2 100%  Physical Exam  Constitutional: He is oriented to person, place, and time. He appears well-developed and well-nourished. No distress.  HENT:  Head: Normocephalic and atraumatic.  Eyes: Conjunctivae and EOM are normal.  Neck: Neck supple. No tracheal deviation present.  Cardiovascular: Normal rate, regular rhythm and normal heart sounds.   Pulmonary/Chest: Effort normal and breath  sounds normal. No respiratory distress.  Musculoskeletal: Normal range of motion. He exhibits no edema or tenderness.  No edema or tenderness in bilateral legs  Neurological: He is alert and oriented to person, place, and time.  Skin: Skin is warm and dry.  Psychiatric: He has a normal mood and affect. His behavior is normal.  Nursing note and vitals reviewed.   ED Course  Procedures (including critical care time) DIAGNOSTIC STUDIES: Oxygen Saturation is 100% on RA, normal by my interpretation.    COORDINATION OF CARE: 11:39 PM-Discussed treatment plan with pt at bedside and pt agreed to plan.     Labs Review Labs Reviewed  BASIC METABOLIC PANEL - Abnormal; Notable for the following:    Chloride 113 (*)    Glucose, Bld 108 (*)    GFR calc non Af Amer 58 (*)    GFR calc Af Amer 67 (*)    All other components within normal limits  CBC  BRAIN NATRIURETIC PEPTIDE  TROPONIN I  D-DIMER, QUANTITATIVE  TROPONIN I  TSH  HEMOGLOBIN A1C  TROPONIN I  TROPONIN I    Imaging Review Dg Chest Port 1 View  10/23/2014   CLINICAL DATA:  Shortness of breath.  EXAM: PORTABLE CHEST - 1 VIEW  COMPARISON:  08/21/2006  FINDINGS: Stable appearance of the heart, with size likely accentuated by lower mediastinal fat. Stable widening of the lower mediastinum related to known hiatal hernia. There is no edema, consolidation, effusion, or pneumothorax.  IMPRESSION: No active disease.   Electronically Signed   By: Jorje Guild M.D.   On: 10/23/2014 23:22     EKG Interpretation   Date/Time:  Sunday October 23 2014 20:39:14 EST Ventricular Rate:  92 PR Interval:  168 QRS Duration: 102 QT Interval:  348 QTC Calculation: 430 R Axis:   73 Text Interpretation:  Normal sinus rhythm Normal ECG No old tracing to  compare Confirmed by Calie Buttrey  MD, Chevon Fomby ([id number]) on 10/23/2014 11:03:40 PM      MDM   Final diagnoses:  Near syncope  Chest pain   Patient with near-syncope, chest tightness and  dyspnea. Low risk for PE and has negative Ddimer. EKG shows no acute ischemia, though symptoms have resolved on arrival to ED. With risk factors, he is moderate risk for ACS. Will admit to hospitalist for observation.  I personally performed the services described in this documentation, which was scribed in my presence. The recorded information has been reviewed and is accurate.     Anthony Hamburger, MD 10/24/14 [phone]

## 2014-10-23 NOTE — ED Notes (Addendum)
C/o of SOB which began one hour ago and chest tightness that last "a few seconds", reports feeling "dizzy" around lunch time while outside working today. VSS. NAD.

## 2014-10-24 ENCOUNTER — Observation Stay (HOSPITAL_COMMUNITY): Payer: BC Managed Care – PPO

## 2014-10-24 ENCOUNTER — Encounter (HOSPITAL_COMMUNITY): Payer: Self-pay | Admitting: Internal Medicine

## 2014-10-24 DIAGNOSIS — R55 Syncope and collapse: Secondary | ICD-10-CM | POA: Diagnosis present

## 2014-10-24 DIAGNOSIS — R0600 Dyspnea, unspecified: Secondary | ICD-10-CM | POA: Diagnosis present

## 2014-10-24 DIAGNOSIS — R072 Precordial pain: Secondary | ICD-10-CM

## 2014-10-24 DIAGNOSIS — R0602 Shortness of breath: Secondary | ICD-10-CM | POA: Diagnosis present

## 2014-10-24 LAB — CBC
HCT: 45.8 % (ref 39.0–52.0)
Hemoglobin: 15.3 g/dL (ref 13.0–17.0)
MCH: 29.2 pg (ref 26.0–34.0)
MCHC: 33.4 g/dL (ref 30.0–36.0)
MCV: 87.4 fL (ref 78.0–100.0)
PLATELETS: 214 10*3/uL (ref 150–400)
RBC: 5.24 MIL/uL (ref 4.22–5.81)
RDW: 13 % (ref 11.5–15.5)
WBC: 6.4 10*3/uL (ref 4.0–10.5)

## 2014-10-24 LAB — BASIC METABOLIC PANEL
ANION GAP: 6 (ref 5–15)
BUN: 19 mg/dL (ref 6–23)
CALCIUM: 9.1 mg/dL (ref 8.4–10.5)
CHLORIDE: 113 meq/L — AB (ref 96–112)
CO2: 25 mmol/L (ref 19–32)
Creatinine, Ser: 1.35 mg/dL (ref 0.50–1.35)
GFR calc Af Amer: 67 mL/min — ABNORMAL LOW (ref >90)
GFR calc non Af Amer: 58 mL/min — ABNORMAL LOW (ref >90)
Glucose, Bld: 108 mg/dL — ABNORMAL HIGH (ref 70–99)
Potassium: 3.8 mmol/L (ref 3.5–5.1)
SODIUM: 144 mmol/L (ref 135–145)

## 2014-10-24 LAB — TROPONIN I
Troponin I: <0.03 ng/mL (ref <0.031)
Troponin I: <0.03 ng/mL (ref <0.031)
Troponin I: <0.03 ng/mL (ref <0.031)

## 2014-10-24 LAB — HEMOGLOBIN A1C
HEMOGLOBIN A1C: 5.6 % (ref <5.7)
Mean Plasma Glucose: 114 mg/dL (ref <117)

## 2014-10-24 LAB — BRAIN NATRIURETIC PEPTIDE: B NATRIURETIC PEPTIDE 5: 13 pg/mL (ref 0.0–100.0)

## 2014-10-24 LAB — D-DIMER, QUANTITATIVE (NOT AT ARMC): D DIMER QUANT: 0.41 ug{FEU}/mL (ref 0.00–0.48)

## 2014-10-24 LAB — TSH: TSH: 2.288 u[IU]/mL (ref 0.350–4.500)

## 2014-10-24 MED ORDER — ACETAMINOPHEN 325 MG PO TABS: 650.0000 mg | ORAL_TABLET | Freq: Four times a day (QID) | ORAL | Status: DC | PRN

## 2014-10-24 MED ORDER — SODIUM CHLORIDE 0.9 % IJ SOLN
3.0000 mL | Freq: Two times a day (BID) | INTRAMUSCULAR | Status: DC
  Administered 2014-10-24 – 2014-10-25 (×4): 3 mL via INTRAVENOUS

## 2014-10-24 MED ORDER — SODIUM CHLORIDE 0.9 % IJ SOLN: 3.0000 mL | INTRAMUSCULAR | Status: DC | PRN

## 2014-10-24 MED ORDER — ACETAMINOPHEN 500 MG PO TABS
1000.0000 mg | ORAL_TABLET | Freq: Four times a day (QID) | ORAL | Status: DC | PRN
  Administered 2014-10-26: 1000 mg via ORAL
  Filled 2014-10-24: qty 2

## 2014-10-24 MED ORDER — SODIUM CHLORIDE 0.9 % IV SOLN: 250.0000 mL | INTRAVENOUS | Status: DC | PRN

## 2014-10-24 MED ORDER — ASPIRIN EC 81 MG PO TBEC
81.0000 mg | DELAYED_RELEASE_TABLET | Freq: Once | ORAL | Status: AC
  Administered 2014-10-24: 81 mg via ORAL
  Filled 2014-10-24: qty 1

## 2014-10-24 MED ORDER — ADULT MULTIVITAMIN W/MINERALS CH
1.0000 | ORAL_TABLET | Freq: Every day | ORAL | Status: DC
  Administered 2014-10-24 – 2014-10-27 (×4): 1 via ORAL
  Filled 2014-10-24 (×4): qty 1

## 2014-10-24 MED ORDER — PANTOPRAZOLE SODIUM 40 MG PO TBEC
40.0000 mg | DELAYED_RELEASE_TABLET | Freq: Every day | ORAL | Status: DC
  Administered 2014-10-24 – 2014-10-27 (×4): 40 mg via ORAL
  Filled 2014-10-24 (×4): qty 1

## 2014-10-24 MED ORDER — SODIUM CHLORIDE 0.9 % IJ SOLN
3.0000 mL | Freq: Two times a day (BID) | INTRAMUSCULAR | Status: DC
  Administered 2014-10-24: 3 mL via INTRAVENOUS

## 2014-10-24 MED ORDER — PROMETHAZINE HCL 25 MG PO TABS: 25.0000 mg | ORAL_TABLET | Freq: Four times a day (QID) | ORAL | Status: DC | PRN

## 2014-10-24 MED ORDER — IRBESARTAN 300 MG PO TABS
300.0000 mg | ORAL_TABLET | Freq: Every day | ORAL | Status: DC
  Administered 2014-10-24 – 2014-10-27 (×4): 300 mg via ORAL
  Filled 2014-10-24 (×4): qty 1

## 2014-10-24 MED ORDER — ACETAMINOPHEN 650 MG RE SUPP: 650.0000 mg | Freq: Four times a day (QID) | RECTAL | Status: DC | PRN

## 2014-10-24 MED ORDER — ROSUVASTATIN CALCIUM 10 MG PO TABS
10.0000 mg | ORAL_TABLET | Freq: Every day | ORAL | Status: DC
  Administered 2014-10-24 – 2014-10-26 (×3): 10 mg via ORAL
  Filled 2014-10-24 (×5): qty 1

## 2014-10-24 MED ORDER — ENOXAPARIN SODIUM 40 MG/0.4ML ~~LOC~~ SOLN
40.0000 mg | SUBCUTANEOUS | Status: DC
  Administered 2014-10-24 – 2014-10-25 (×2): 40 mg via SUBCUTANEOUS
  Filled 2014-10-24 (×3): qty 0.4

## 2014-10-24 NOTE — H&P (Addendum)
Anthony Anderson is an 53 y.o. male.    Anthony Anderson (pcp)  Chief Complaint: dyspnea, dizziness HPI: 53 yo male with hypertension, hyperlipidemia, family hx of cad, apparently c/o dizziness intermittently for the past 61monthor so.  Pt noted yesterday 10/23/2014 that he had dyspnea starting at about noon , which became worse.  The dyspnea was not worse with exertion.  Slight chest tightness.   Pt denies fever, chills, cough, palp, n/v, diarrhea, brbpr, black stool.  Pt presented to ED due to dyspnea.  CXR was negative for any acute process.  Trop negative,  D dimer negative.  Pt will be admitted for dyspnea, near syncope  Past Medical History  Diagnosis Date  . Kidney stones   . Hypertension   . Hyperlipidemia   . GERD (gastroesophageal reflux disease)   . Cholelithiasis   . PSA elevation     Past Surgical History  Procedure Laterality Date  . Lithotripsy    . Nasal sinus surgery      Family History  Problem Relation Age of Onset  . CAD Mother   . CAD Father   . Prostate cancer Father    Social History:  reports that he quit smoking about 32 years ago. His smoking use included Cigarettes. He has a 10 pack-year smoking history. He has never used smokeless tobacco. He reports that he does not drink alcohol or use illicit drugs.  Allergies: No Known Allergies   (Not in a hospital admission)  Results for orders placed or performed during the hospital encounter of 10/23/14 (from the past 48 hour(s))  CBC     Status: None   Collection Time: 10/23/14 11:21 PM  Result Value Ref Range   WBC 6.4 4.0 - 10.5 K/uL   RBC 5.24 4.22 - 5.81 MIL/uL   Hemoglobin 15.3 13.0 - 17.0 g/dL   HCT 45.8 39.0 - 52.0 %   MCV 87.4 78.0 - 100.0 fL   MCH 29.2 26.0 - 34.0 pg   MCHC 33.4 30.0 - 36.0 g/dL   RDW 13.0 11.5 - 15.5 %   Platelets 214 150 - 400 K/uL  Basic metabolic panel     Status: Abnormal   Collection Time: 10/23/14 11:21 PM  Result Value Ref Range   Sodium 144 135 - 145 mmol/L     Comment: Please note change in reference range.   Potassium 3.8 3.5 - 5.1 mmol/L    Comment: Please note change in reference range.   Chloride 113 (H) 96 - 112 mEq/L   CO2 25 19 - 32 mmol/L   Glucose, Bld 108 (H) 70 - 99 mg/dL   BUN 19 6 - 23 mg/dL   Creatinine, Ser 1.35 0.50 - 1.35 mg/dL   Calcium 9.1 8.4 - 10.5 mg/dL   GFR calc non Af Amer 58 (L) >90 mL/min   GFR calc Af Amer 67 (L) >90 mL/min    Comment: (NOTE) The eGFR has been calculated using the CKD EPI equation. This calculation has not been validated in all clinical situations. eGFR's persistently <90 mL/min signify possible Chronic Kidney Disease.    Anion gap 6 5 - 15  BNP (order ONLY if patient complains of dyspnea/SOB AND you have documented it for THIS visit)     Status: None   Collection Time: 10/23/14 11:21 PM  Result Value Ref Range   B Natriuretic Peptide 13.0 0.0 - 100.0 pg/mL    Comment: Please note change in reference range.  Troponin I (MHP)  Status: None   Collection Time: 10/23/14 11:21 PM  Result Value Ref Range   Troponin I <0.03 <0.031 ng/mL    Comment:        NO INDICATION OF MYOCARDIAL INJURY. Please note change in reference range.   D-dimer, quantitative     Status: None   Collection Time: 10/23/14 11:42 PM  Result Value Ref Range   D-Dimer, Quant 0.41 0.00 - 0.48 ug/mL-FEU    Comment:        AT THE INHOUSE ESTABLISHED CUTOFF VALUE OF 0.48 ug/mL FEU, THIS ASSAY HAS BEEN DOCUMENTED IN THE LITERATURE TO HAVE A SENSITIVITY AND NEGATIVE PREDICTIVE VALUE OF AT LEAST 98 TO 99%.  THE TEST RESULT SHOULD BE CORRELATED WITH AN ASSESSMENT OF THE CLINICAL PROBABILITY OF DVT / VTE.    Dg Chest Port 1 View  10/23/2014   CLINICAL DATA:  Shortness of breath.  EXAM: PORTABLE CHEST - 1 VIEW  COMPARISON:  08/21/2006  FINDINGS: Stable appearance of the heart, with size likely accentuated by lower mediastinal fat. Stable widening of the lower mediastinum related to known hiatal hernia. There is no edema,  consolidation, effusion, or pneumothorax.  IMPRESSION: No active disease.   Electronically Signed   By: Jorje Guild M.D.   On: 10/23/2014 23:22    Review of Systems  Constitutional: Negative for fever, chills, weight loss, malaise/fatigue and diaphoresis.  HENT: Negative for congestion, ear discharge, ear pain, hearing loss, nosebleeds, sore throat and tinnitus.   Eyes: Negative for blurred vision, double vision, photophobia, pain, discharge and redness.  Respiratory: Positive for shortness of breath. Negative for cough, hemoptysis, sputum production, wheezing and stridor.   Cardiovascular: Negative for chest pain, palpitations, orthopnea, claudication, leg swelling and PND.  Gastrointestinal: Negative for heartburn, nausea, vomiting, abdominal pain, diarrhea, constipation, blood in stool and melena.  Genitourinary: Negative for dysuria, urgency, frequency, hematuria and flank pain.  Musculoskeletal: Negative for myalgias, back pain, joint pain, falls and neck pain.  Skin: Negative for itching and rash.  Neurological: Positive for dizziness. Negative for tingling, tremors, sensory change, speech change, focal weakness, seizures, loss of consciousness, weakness and headaches.  Endo/Heme/Allergies: Negative for environmental allergies and polydipsia. Does not bruise/bleed easily.  Psychiatric/Behavioral: Negative for depression, suicidal ideas, hallucinations, memory loss and substance abuse. The patient is not nervous/anxious and does not have insomnia.     Blood pressure 154/95, pulse 102, temperature 98.2 F (36.8 C), temperature source Oral, resp. rate 23, height _0  (1.778 m), weight 103.874 kg (229 lb), SpO2 100 %. Physical Exam  Constitutional: He is oriented to person, place, and time. He appears well-developed and well-nourished.  HENT:  Head: Normocephalic and atraumatic.  Eyes: Conjunctivae and EOM are normal. Pupils are equal, round, and reactive to light.  Neck: Normal  range of motion. Neck supple. No JVD present. No tracheal deviation present. No thyromegaly present.  Cardiovascular: Normal rate and regular rhythm.  Exam reveals no gallop and no friction rub.   No murmur heard. Respiratory: Effort normal and breath sounds normal. No stridor. No respiratory distress. He has no wheezes. He has no rales.  GI: Soft. Bowel sounds are normal. He exhibits no distension. There is no tenderness. There is no rebound and no guarding.  Musculoskeletal: Normal range of motion. He exhibits no edema or tenderness.  Lymphadenopathy:    He has no cervical adenopathy.  Neurological: He is alert and oriented to person, place, and time. He has normal reflexes. He displays normal reflexes. No cranial nerve  deficit. He exhibits normal muscle tone. Coordination normal.  Skin: Skin is warm and dry. No rash noted. No erythema. No pallor.  Psychiatric: He has a normal mood and affect. His behavior is normal. Judgment and thought content normal.     Assessment/Plan Dyspnea Check cardiac echo R/o ischemic heart disease/cardiomyopathy Consider PFT with lung volume, and DLCO as outpatient  Dizziness, near syncope Check carotid ultrasound, cardiac echo  Chest tightness NPO Cycle cardiac markers Check nuclear stress test  Tachycardia Telemetry Check tsh Check echo Cycle cardiac markers as above  Hyperglycemia Check hga1c   Jani Gravel 10/24/2014, 3:30 AM

## 2014-10-24 NOTE — Consult Note (Signed)
Primary Physician: Primary Cardiologist:   HPI:  Anthony Anderson is a 53 yo who we are asked to see re dizziness, SOB The patient has a history of HTN, HL.  Has had dizziness intermitt for 1 month.  Yesterday was working on deck  Around noon he became dizzy, SOB  Lasted less than 5 min  Sat down.  Afterward patient went back to work on deck though at a slower pace.  Worked til about 4:30 PM  Says he was drinking fluids.   Denies lightheadedness.  No SOB Went to sisters hous for dinner  Around 7:30 PM while on sofa became very SOB and developed chest tightness  thghtne  Was 3/10 in intensity  Felt like he was going to pass out.  Brought to ER In ER chst tightness was 1/10  Still didn't feel right Today felt OK  No dizziness  No tightness Laid in bed.       Past Medical History  Diagnosis Date  . Kidney stones   . Hypertension   . Hyperlipidemia   . GERD (gastroesophageal reflux disease)   . Cholelithiasis   . PSA elevation     Medications Prior to Admission  Medication Sig Dispense Refill  . acetaminophen (TYLENOL) 500 MG tablet Take 1,000 mg by mouth every 6 (six) hours as needed for mild pain or moderate pain.    Marland Kitchen aspirin EC 81 MG tablet Take 81 mg by mouth once.    Marland Kitchen esomeprazole (NEXIUM) 40 MG capsule Take 40 mg by mouth daily before breakfast.     . Misc Natural Products (MENS PROSTATE HEALTH FORMULA PO) Take 1 tablet by mouth daily.    . Multiple Vitamin (MULTIVITAMIN WITH MINERALS) TABS tablet Take 1 tablet by mouth daily.    Marland Kitchen olmesartan (BENICAR) 40 MG tablet Take 40 mg by mouth daily.    . rosuvastatin (CRESTOR) 10 MG tablet Take 10 mg by mouth daily.     Marland Kitchen oxyCODONE-acetaminophen (PERCOCET/ROXICET) 5-325 MG per tablet Take one or two tabs po q 4 hrs prn pain (Patient not taking: Reported on 10/23/2014) 24 tablet 0  . promethazine (PHENERGAN) 25 MG tablet Take 1 tablet (25 mg total) by mouth every 6 (six) hours as needed for nausea. 15 tablet 0  . Tamsulosin HCl (FLOMAX) 0.4  MG CAPS Take 1 capsule (0.4 mg total) by mouth daily. (Patient not taking: Reported on 10/23/2014) 20 capsule 0     . enoxaparin (LOVENOX) injection  40 mg Subcutaneous Q24H  . irbesartan  300 mg Oral Daily  . multivitamin with minerals  1 tablet Oral Daily  . pantoprazole  40 mg Oral Daily  . rosuvastatin  10 mg Oral q1800  . sodium chloride  3 mL Intravenous Q12H  . sodium chloride  3 mL Intravenous Q12H    Infusions:    No Known Allergies  History   Social History  . Marital Status: Married    Spouse Name: N/A    Number of Children: N/A  . Years of Education: N/A   Occupational History  . Not on file.   Social History Main Topics  . Smoking status: Former Smoker -- 1.00 packs/day for 10 years    Types: Cigarettes    Quit date: 09/20/1982  . Smokeless tobacco: Never Used  . Alcohol Use: No  . Drug Use: No  . Sexual Activity: Not on file   Other Topics Concern  . Not on file   Social History Narrative  Family History  Problem Relation Age of Onset  . CAD Mother   . CAD Father   . Prostate cancer Father     REVIEW OF SYSTEMS:  All systems reviewed  Negative to the above problem except as noted above.    PHYSICAL EXAM: Filed Vitals:   10/24/14 1459  BP: 127/81  Pulse: 62  Temp: 97.7 F (36.5 C)  Resp:      Intake/Output Summary (Last 24 hours) at 10/24/14 1601 Last data filed at 10/24/14 1500  Gross per 24 hour  Intake      0 ml  Output    200 ml  Net   -200 ml    General:  Well appearing. No respiratory difficulty HEENT: normal Neck: supple. no JVD. Carotids 2+ bilat; no bruits. No lymphadenopathy or thryomegaly appreciated. Cor: PMI nondisplaced. Regular rate & rhythm. No rubs, gallops or murmurs. Lungs: clear Abdomen: soft, nontender, nondistended. No hepatosplenomegaly. No bruits or masses. Good bowel sounds. Extremities: no cyanosis, clubbing, rash, edema Neuro: alert & oriented x 3, cranial nerves grossly intact. moves all 4  extremities w/o difficulty. Affect pleasant.  ECG:  SR 92 bpm   Nonspecific ST T wave changes    Results for orders placed or performed during the hospital encounter of 10/23/14 (from the past 24 hour(s))  CBC     Status: None   Collection Time: 10/23/14 11:21 PM  Result Value Ref Range   WBC 6.4 4.0 - 10.5 K/uL   RBC 5.24 4.22 - 5.81 MIL/uL   Hemoglobin 15.3 13.0 - 17.0 g/dL   HCT 45.8 39.0 - 52.0 %   MCV 87.4 78.0 - 100.0 fL   MCH 29.2 26.0 - 34.0 pg   MCHC 33.4 30.0 - 36.0 g/dL   RDW 13.0 11.5 - 15.5 %   Platelets 214 150 - 400 K/uL  Basic metabolic panel     Status: Abnormal   Collection Time: 10/23/14 11:21 PM  Result Value Ref Range   Sodium 144 135 - 145 mmol/L   Potassium 3.8 3.5 - 5.1 mmol/L   Chloride 113 (H) 96 - 112 mEq/L   CO2 25 19 - 32 mmol/L   Glucose, Bld 108 (H) 70 - 99 mg/dL   BUN 19 6 - 23 mg/dL   Creatinine, Ser 1.35 0.50 - 1.35 mg/dL   Calcium 9.1 8.4 - 10.5 mg/dL   GFR calc non Af Amer 58 (L) >90 mL/min   GFR calc Af Amer 67 (L) >90 mL/min   Anion gap 6 5 - 15  BNP (order ONLY if patient complains of dyspnea/SOB AND you have documented it for THIS visit)     Status: None   Collection Time: 10/23/14 11:21 PM  Result Value Ref Range   B Natriuretic Peptide 13.0 0.0 - 100.0 pg/mL  Troponin I (MHP)     Status: None   Collection Time: 10/23/14 11:21 PM  Result Value Ref Range   Troponin I <0.03 <0.031 ng/mL  D-dimer, quantitative     Status: None   Collection Time: 10/23/14 11:42 PM  Result Value Ref Range   D-Dimer, Quant 0.41 0.00 - 0.48 ug/mL-FEU  TSH     Status: None   Collection Time: 10/24/14  5:27 AM  Result Value Ref Range   TSH 2.288 0.350 - 4.500 uIU/mL  Hemoglobin A1c     Status: None   Collection Time: 10/24/14  5:27 AM  Result Value Ref Range   Hgb A1c MFr Bld 5.6 <5.7 %  Mean Plasma Glucose 114 <117 mg/dL  Troponin I (q 6hr x 3)     Status: None   Collection Time: 10/24/14  5:27 AM  Result Value Ref Range   Troponin I <0.03  <0.031 ng/mL  Troponin I (q 6hr x 3)     Status: None   Collection Time: 10/24/14 11:20 AM  Result Value Ref Range   Troponin I <0.03 <0.031 ng/mL   US Carotid Bilateral  10/24/2014   CLINICAL DATA:  53 year old male with near syncope  EXAM: BILATERAL CAROTID DUPLEX ULTRASOUND  TECHNIQUE: Pearline Cables scale imaging, color Doppler and duplex ultrasound were performed of bilateral carotid and vertebral arteries in the neck.  COMPARISON:  None.  FINDINGS: Criteria: Quantification of carotid stenosis is based on velocity parameters that correlate the residual internal carotid diameter with NASCET-based stenosis levels, using the diameter of the distal internal carotid lumen as the denominator for stenosis measurement.  The following velocity measurements were obtained:  RIGHT  ICA:  68/15 cm/sec  CCA:  49/17 cm/sec  SYSTOLIC ICA/CCA RATIO:  0.8  DIASTOLIC ICA/CCA RATIO:  0.7  ECA:  95 cm/sec  LEFT  ICA:  60/25 cm/sec  CCA:  A 9/15 cm/sec  SYSTOLIC ICA/CCA RATIO:  0.7  DIASTOLIC ICA/CCA RATIO:  1.2  ECA:  83 cm/sec  RIGHT CAROTID ARTERY: No significant atherosclerotic plaque or evidence of stenosis.  RIGHT VERTEBRAL ARTERY:  Patent with normal antegrade flow.  LEFT CAROTID ARTERY: No significant atherosclerotic plaque or evidence of stenosis.  LEFT VERTEBRAL ARTERY:  Patent with normal antegrade flow.  IMPRESSION: Negative bilateral duplex carotid ultrasound.  Signed,  Criselda Peaches, MD  [facility]  [facility]   Electronically Signed   By: Jacqulynn Cadet M.D.   On: 10/24/2014 15:50   Dg Chest Port 1 View  10/23/2014   CLINICAL DATA:  Shortness of breath.  EXAM: PORTABLE CHEST - 1 VIEW  COMPARISON:  08/21/2006  FINDINGS: Stable appearance of the heart, with size likely accentuated by lower mediastinal fat. Stable widening of the lower mediastinum related to known hiatal hernia. There is no edema, consolidation, effusion, or pneumothorax.  IMPRESSION: No active  disease.   Electronically Signed   By: Jorje Guild M.D.   On: 10/23/2014 23:22     ASSESSMENT:  53 yo with no prior cardiac history  Over past 1 month has had mild intermitt dizziness  Yest was worse  Assoc with some SOB and some chst tightness.  R/O for MI  D Dimer neg Would check orhtostatics  Continue telemetry  Set up for stress myoview in AM    2.  HTN  Follow    3  HL  Continue statin.

## 2014-10-24 NOTE — Progress Notes (Signed)
Subjective: He was admitted with chest tightness shortness of breath and a feeling of being off balance. He's been having a little bit of trouble with this for several days. He says when he was in the emergency department he did feel episodes coming on and when they were coming on his heart rate would go up.  Objective: Vital signs in last 24 hours: Temp:  [98.1 F (36.7 C)-99.1 F (37.3 C)] 99.1 F (37.3 C) (12/28 0350) Pulse Rate:  [73-102] 74 (12/28 0633) Resp:  [17-24] 17 (12/28 0633) BP: (112-158)/(67-95) 132/82 mmHg (12/28 0905) SpO2:  [98 %-100 %] 98 % (12/28 0938) Weight:  [96.435 kg (212 lb 9.6 oz)-103.874 kg (229 lb)] 96.435 kg (212 lb 9.6 oz) (12/28 1829) Weight change:  Last BM Date: 10/23/14  Intake/Output from previous day:    PHYSICAL EXAM General appearance: alert, cooperative and no distress Resp: clear to auscultation bilaterally Cardio: regular rate and rhythm, S1, S2 normal, no murmur, click, rub or gallop GI: soft, non-tender; bowel sounds normal; no masses,  no organomegaly Extremities: extremities normal, atraumatic, no cyanosis or edema  Lab Results:  Results for orders placed or performed during the hospital encounter of 10/23/14 (from the past 48 hour(s))  CBC     Status: None   Collection Time: 10/23/14 11:21 PM  Result Value Ref Range   WBC 6.4 4.0 - 10.5 K/uL   RBC 5.24 4.22 - 5.81 MIL/uL   Hemoglobin 15.3 13.0 - 17.0 g/dL   HCT 45.8 39.0 - 52.0 %   MCV 87.4 78.0 - 100.0 fL   MCH 29.2 26.0 - 34.0 pg   MCHC 33.4 30.0 - 36.0 g/dL   RDW 13.0 11.5 - 15.5 %   Platelets 214 150 - 400 K/uL  Basic metabolic panel     Status: Abnormal   Collection Time: 10/23/14 11:21 PM  Result Value Ref Range   Sodium 144 135 - 145 mmol/L    Comment: Please note change in reference range.   Potassium 3.8 3.5 - 5.1 mmol/L    Comment: Please note change in reference range.   Chloride 113 (H) 96 - 112 mEq/L   CO2 25 19 - 32 mmol/L   Glucose, Bld 108 (H) 70 - 99  mg/dL   BUN 19 6 - 23 mg/dL   Creatinine, Ser 1.35 0.50 - 1.35 mg/dL   Calcium 9.1 8.4 - 10.5 mg/dL   GFR calc non Af Amer 58 (L) >90 mL/min   GFR calc Af Amer 67 (L) >90 mL/min    Comment: (NOTE) The eGFR has been calculated using the CKD EPI equation. This calculation has not been validated in all clinical situations. eGFR's persistently <90 mL/min signify possible Chronic Kidney Disease.    Anion gap 6 5 - 15  BNP (order ONLY if patient complains of dyspnea/SOB AND you have documented it for THIS visit)     Status: None   Collection Time: 10/23/14 11:21 PM  Result Value Ref Range   B Natriuretic Peptide 13.0 0.0 - 100.0 pg/mL    Comment: Please note change in reference range.  Troponin I (MHP)     Status: None   Collection Time: 10/23/14 11:21 PM  Result Value Ref Range   Troponin I <0.03 <0.031 ng/mL    Comment:        NO INDICATION OF MYOCARDIAL INJURY. Please note change in reference range.   D-dimer, quantitative     Status: None   Collection Time: 10/23/14 11:42 PM  Result Value Ref Range   D-Dimer, Quant 0.41 0.00 - 0.48 ug/mL-FEU    Comment:        AT THE INHOUSE ESTABLISHED CUTOFF VALUE OF 0.48 ug/mL FEU, THIS ASSAY HAS BEEN DOCUMENTED IN THE LITERATURE TO HAVE A SENSITIVITY AND NEGATIVE PREDICTIVE VALUE OF AT LEAST 98 TO 99%.  THE TEST RESULT SHOULD BE CORRELATED WITH AN ASSESSMENT OF THE CLINICAL PROBABILITY OF DVT / VTE.   Troponin I (q 6hr x 3)     Status: None   Collection Time: 10/24/14  5:27 AM  Result Value Ref Range   Troponin I <0.03 <0.031 ng/mL    Comment:        NO INDICATION OF MYOCARDIAL INJURY. Please note change in reference range.     ABGS No results for input(s): PHART, PO2ART, TCO2, HCO3 in the last 72 hours.  Invalid input(s): PCO2 CULTURES No results found for this or any previous visit (from the past 240 hour(s)). Studies/Results: Dg Chest Port 1 View  10/23/2014   CLINICAL DATA:  Shortness of breath.  EXAM: PORTABLE  CHEST - 1 VIEW  COMPARISON:  08/21/2006  FINDINGS: Stable appearance of the heart, with size likely accentuated by lower mediastinal fat. Stable widening of the lower mediastinum related to known hiatal hernia. There is no edema, consolidation, effusion, or pneumothorax.  IMPRESSION: No active disease.   Electronically Signed   By: Jorje Guild M.D.   On: 10/23/2014 23:22    Medications:  Prior to Admission:  Prescriptions prior to admission  Medication Sig Dispense Refill Last Dose  . acetaminophen (TYLENOL) 500 MG tablet Take 1,000 mg by mouth every 6 (six) hours as needed for mild pain or moderate pain.   10/23/2014 at 1700  . aspirin EC 81 MG tablet Take 81 mg by mouth once.   10/23/2014 at Unknown time  . esomeprazole (NEXIUM) 40 MG capsule Take 40 mg by mouth daily before breakfast.    10/23/2014 at Unknown time  . Misc Natural Products (MENS PROSTATE HEALTH FORMULA PO) Take 1 tablet by mouth daily.   10/23/2014 at Unknown time  . Multiple Vitamin (MULTIVITAMIN WITH MINERALS) TABS tablet Take 1 tablet by mouth daily.   10/23/2014 at Unknown time  . olmesartan (BENICAR) 40 MG tablet Take 40 mg by mouth daily.   10/23/2014 at Unknown time  . rosuvastatin (CRESTOR) 10 MG tablet Take 10 mg by mouth daily.    10/23/2014 at Unknown time  . oxyCODONE-acetaminophen (PERCOCET/ROXICET) 5-325 MG per tablet Take one or two tabs po q 4 hrs prn pain (Patient not taking: Reported on 10/23/2014) 24 tablet 0   . promethazine (PHENERGAN) 25 MG tablet Take 1 tablet (25 mg total) by mouth every 6 (six) hours as needed for nausea. 15 tablet 0   . Tamsulosin HCl (FLOMAX) 0.4 MG CAPS Take 1 capsule (0.4 mg total) by mouth daily. (Patient not taking: Reported on 10/23/2014) 20 capsule 0    Scheduled: . enoxaparin (LOVENOX) injection  40 mg Subcutaneous Q24H  . irbesartan  300 mg Oral Daily  . multivitamin with minerals  1 tablet Oral Daily  . pantoprazole  40 mg Oral Daily  . rosuvastatin  10 mg Oral q1800   . sodium chloride  3 mL Intravenous Q12H  . sodium chloride  3 mL Intravenous Q12H   Continuous:  PRX:YVOPFY chloride, acetaminophen **OR** acetaminophen, acetaminophen, promethazine, sodium chloride  Assesment: He has chest tightness with shortness of breath and a sense of being off balance  and had near syncope. He has a cardiac history in his family and has a history of hypertension and hyperlipidemia personally. Active Problems:   Shortness of breath   Near syncope   Dyspnea    Plan: Cardiology consultation for potential ischemic workup    LOS: 1 day   Vendela Troung L 10/24/2014, 9:06 AM

## 2014-10-24 NOTE — Care Management Note (Addendum)
    Page 1 of 1   10/26/2014     9:05:43 AM CARE MANAGEMENT NOTE 10/26/2014  Patient:  KAYSON, TASKER A   Account Number:  [id number]  Date Initiated:  10/24/2014  Documentation initiated by:  Theophilus Kinds  Subjective/Objective Assessment:   Pt admitted from home with dyspnea. Pt lives with his wife and will return home at discharge. Pt is independent with ADL's.     Action/Plan:   No CM needs noted.Anticipate discharge within 24 hours.   Anticipated DC Date:  10/25/2014   Anticipated DC Plan:  [place]  CM consult      Choice offered to / List presented to:             Status of service:  Completed, signed off Medicare Important Message given?   (If response is "NO", the following Medicare IM given date fields will be blank) Date Medicare IM given:   Medicare IM given by:   Date Additional Medicare IM given:   Additional Medicare IM given by:    Discharge Disposition:  ACUTE TO ACUTE TRANS  Per UR Regulation:    If discussed at Long Length of Stay Meetings, dates discussed:    Comments:  10/16/14 0900 Christinia Gully, RN BSN CM Pt to transfer to [facility] for cath. CM on receiving unit to follow for any discharge needs.  10/24/14 Steele, RN BSN CM

## 2014-10-24 NOTE — Progress Notes (Signed)
UR completed 

## 2014-10-25 ENCOUNTER — Observation Stay (HOSPITAL_COMMUNITY): Payer: BC Managed Care – PPO

## 2014-10-25 ENCOUNTER — Encounter (HOSPITAL_COMMUNITY): Payer: Self-pay

## 2014-10-25 DIAGNOSIS — I059 Rheumatic mitral valve disease, unspecified: Secondary | ICD-10-CM

## 2014-10-25 MED ORDER — TECHNETIUM TC 99M SESTAMIBI - CARDIOLITE
10.0000 | Freq: Once | INTRAVENOUS | Status: AC | PRN
  Administered 2014-10-25: 10 via INTRAVENOUS

## 2014-10-25 MED ORDER — SODIUM CHLORIDE 0.9 % IJ SOLN
10.0000 mL | INTRAMUSCULAR | Status: DC | PRN
  Administered 2014-10-25: 10 mL via INTRAVENOUS
  Filled 2014-10-25: qty 10

## 2014-10-25 MED ORDER — TECHNETIUM TC 99M SESTAMIBI GENERIC - CARDIOLITE
30.0000 | Freq: Once | INTRAVENOUS | Status: AC | PRN
  Administered 2014-10-25: 30 via INTRAVENOUS

## 2014-10-25 MED ORDER — SODIUM CHLORIDE 0.9 % IJ SOLN
INTRAMUSCULAR | Status: AC
  Filled 2014-10-25: qty 10

## 2014-10-25 NOTE — Progress Notes (Signed)
Consulting cardiologist:Ross, Nevin Bloodgood  Primary [place]  Cardiology Specific Problem List: 1.Chest Pain 2.Hypertension 3.Hypercholesterolemia  Subjective:    No recurrent chest pain. Patient examined in stress lab  Objective:   Temp:  [97.7 F (36.5 C)-98.2 F (36.8 C)] 98.2 F (36.8 C) (12/28 1933) Pulse Rate:  [62-66] 66 (12/28 1933) BP: (126-127)/(81-83) 126/83 mmHg (12/28 1933) SpO2:  [97 %] 97 % (12/28 1933) Last BM Date: 10/24/14  Filed Weights   10/23/14 2035 10/24/14 0441 10/24/14 1751  Weight: 229 lb (103.874 kg) 226 lb 6.4 oz (102.694 kg) 212 lb 9.6 oz (96.435 kg)    Intake/Output Summary (Last 24 hours) at 10/25/14 0911 Last data filed at 10/24/14 1700  Gross per 24 hour  Intake    480 ml  Output    200 ml  Net    280 ml    Telemetry: NSR  Exam:  General: No acute distress.  HEENT: Conjunctiva and lids normal, oropharynx clear.  Lungs: Clear to auscultation, nonlabored.  Cardiac: No elevated JVP or bruits. RRR, no gallop or rub.   Abdomen: Normoactive bowel sounds, nontender, nondistended.  Extremities: No pitting edema, distal pulses full.  Neuropsychiatric: Alert and oriented x3, affect appropriate.   Lab Results:  Basic Metabolic Panel:  Recent Labs Lab 10/23/14 2321  NA 144  K 3.8  CL 113*  CO2 25  GLUCOSE 108*  BUN 19  CREATININE 1.35  CALCIUM 9.1    CBC:  Recent Labs Lab 10/23/14 2321  WBC 6.4  HGB 15.3  HCT 45.8  MCV 87.4  PLT 214    Cardiac Enzymes:  Recent Labs Lab 10/24/14 0527 10/24/14 1120 10/24/14 1714  TROPONINI <0.03 <0.03 <0.03    Radiology: US Carotid Bilateral  10/24/2014   CLINICAL DATA:  53 year old male with near syncope  EXAM: BILATERAL CAROTID DUPLEX ULTRASOUND  TECHNIQUE: Pearline Cables scale imaging, color Doppler and duplex ultrasound were performed of bilateral carotid and vertebral arteries in the neck.  COMPARISON:  None.  FINDINGS: Criteria: Quantification of carotid  stenosis is based on velocity parameters that correlate the residual internal carotid diameter with NASCET-based stenosis levels, using the diameter of the distal internal carotid lumen as the denominator for stenosis measurement.  The following velocity measurements were obtained:  RIGHT  ICA:  68/15 cm/sec  CCA:  02/58 cm/sec  SYSTOLIC ICA/CCA RATIO:  0.8  DIASTOLIC ICA/CCA RATIO:  0.7  ECA:  95 cm/sec  LEFT  ICA:  60/25 cm/sec  CCA:  A 5/27 cm/sec  SYSTOLIC ICA/CCA RATIO:  0.7  DIASTOLIC ICA/CCA RATIO:  1.2  ECA:  83 cm/sec  RIGHT CAROTID ARTERY: No significant atherosclerotic plaque or evidence of stenosis.  RIGHT VERTEBRAL ARTERY:  Patent with normal antegrade flow.  LEFT CAROTID ARTERY: No significant atherosclerotic plaque or evidence of stenosis.  LEFT VERTEBRAL ARTERY:  Patent with normal antegrade flow.  IMPRESSION: Negative bilateral duplex carotid ultrasound.  Signed,  Criselda Peaches, MD  [facility]  [facility]   Electronically Signed   By: Jacqulynn Cadet M.D.   On: 10/24/2014 15:50   Dg Chest Port 1 View  10/23/2014   CLINICAL DATA:  Shortness of breath.  EXAM: PORTABLE CHEST - 1 VIEW  COMPARISON:  08/21/2006  FINDINGS: Stable appearance of the heart, with size likely accentuated by lower mediastinal fat. Stable widening of the lower mediastinum related to known hiatal hernia. There is no edema, consolidation, effusion, or pneumothorax.  IMPRESSION: No active disease.  Electronically Signed   By: Jorje Guild M.D.   On: 10/23/2014 23:22     Medications:   Scheduled Medications: . enoxaparin (LOVENOX) injection  40 mg Subcutaneous Q24H  . irbesartan  300 mg Oral Daily  . multivitamin with minerals  1 tablet Oral Daily  . pantoprazole  40 mg Oral Daily  . rosuvastatin  10 mg Oral q1800  . sodium chloride  3 mL Intravenous Q12H  . sodium chloride  3 mL Intravenous Q12H  . sodium chloride          PRN Medications: sodium  chloride, acetaminophen **OR** acetaminophen, acetaminophen, promethazine, sodium chloride, sodium chloride, technetium sestamibi generic   Assessment and Plan:   1.Chest Pain: No recurrent chest pain overnight. Troponin is negative. Stress test this am. No acute ST-T wave changes at maximum exercise. Complained of dizziness at the end of the test. Mild dizzy this AM prior BP appropriate. If NM scintigraphy is normal, would discharge.   2. Hypertension: BP controlled. Appropriate response to exercise. Continue Avapro. Creatinine 1.35. Not on diuretics at home. Increase fluid intake.   3. Hypercholesterolemia: Continue rovastatin.  4. Dizziness:  Mild dizzy this AM before and after treadmill test.   Consider decreasing Flomax to 0.2 mg. On narcotics which can also be contributing.   Phill Myron. Lawrence NP [place]  10/25/2014, 9:11 AM   Patinet seen and examined  I have amended note above to reflect my findings.  WIll follow.    Dorris Carnes

## 2014-10-25 NOTE — Progress Notes (Signed)
*  PRELIMINARY RESULTS* Echocardiogram 2D Echocardiogram has been performed.  Leavy Cella 10/25/2014, 10:50 AM

## 2014-10-25 NOTE — Progress Notes (Signed)
Stress Lab Nurses Notes - [name]  Anthony Anderson 10/25/2014 Reason for doing test: Chest Pain and Dyspnea Type of test: Stress Cardiolyte Nurse performing test: Benay Pillow RN Nuclear Medicine Tech: Dyanne Carrel Echo Tech: Not Applicable MD performing test: Dr. Lawana Chambers PA Family MD: Luan Pulling Test explained and consent signed: Yes.   IV started: Saline lock started in radiology Symptoms: "little" dizziness before started but got worse with exercising Treatment/Intervention: none Reason test stopped: reached target HR After recovery IV was: Saline Lock flushed and kept in due to Inpatient Patient to return to [place]. Med at :0910 Patient discharged: Transported back to room 314 via radiology Patient's Condition upon discharge was: stable Comments: Patient stated dizziness getting better after sitting down from test.Peak bp 176/82, HR 160. Recovery BP 144/81, HR 90 Anthony Anderson

## 2014-10-25 NOTE — Progress Notes (Signed)
UR completed 

## 2014-10-25 NOTE — Progress Notes (Signed)
He is currently undergoing stress testing. If this is negative he can be discharged with follow-up as an outpatient

## 2014-10-26 ENCOUNTER — Encounter (HOSPITAL_COMMUNITY)
Admission: EM | Disposition: A | Discharge status: Home / Self Care | Payer: Self-pay | Source: Home / Self Care | Attending: Pulmonary Disease

## 2014-10-26 ENCOUNTER — Encounter (HOSPITAL_COMMUNITY): Payer: Self-pay | Admitting: Interventional Cardiology

## 2014-10-26 DIAGNOSIS — Z7982 Long term (current) use of aspirin: Secondary | ICD-10-CM | POA: Diagnosis not present

## 2014-10-26 DIAGNOSIS — I209 Angina pectoris, unspecified: Secondary | ICD-10-CM

## 2014-10-26 DIAGNOSIS — I2510 Atherosclerotic heart disease of native coronary artery without angina pectoris: Secondary | ICD-10-CM | POA: Diagnosis present

## 2014-10-26 DIAGNOSIS — R0789 Other chest pain: Secondary | ICD-10-CM

## 2014-10-26 DIAGNOSIS — E785 Hyperlipidemia, unspecified: Secondary | ICD-10-CM | POA: Diagnosis present

## 2014-10-26 DIAGNOSIS — I10 Essential (primary) hypertension: Secondary | ICD-10-CM | POA: Diagnosis present

## 2014-10-26 DIAGNOSIS — K219 Gastro-esophageal reflux disease without esophagitis: Secondary | ICD-10-CM | POA: Diagnosis present

## 2014-10-26 DIAGNOSIS — I6529 Occlusion and stenosis of unspecified carotid artery: Secondary | ICD-10-CM | POA: Diagnosis present

## 2014-10-26 DIAGNOSIS — R55 Syncope and collapse: Secondary | ICD-10-CM | POA: Diagnosis not present

## 2014-10-26 DIAGNOSIS — R0602 Shortness of breath: Secondary | ICD-10-CM | POA: Diagnosis present

## 2014-10-26 DIAGNOSIS — R000 Tachycardia, unspecified: Secondary | ICD-10-CM | POA: Diagnosis present

## 2014-10-26 DIAGNOSIS — R079 Chest pain, unspecified: Secondary | ICD-10-CM | POA: Diagnosis present

## 2014-10-26 DIAGNOSIS — Z87891 Personal history of nicotine dependence: Secondary | ICD-10-CM | POA: Diagnosis not present

## 2014-10-26 HISTORY — PX: LEFT HEART CATHETERIZATION WITH CORONARY ANGIOGRAM: SHX5451

## 2014-10-26 LAB — BASIC METABOLIC PANEL
Anion gap: 4 — ABNORMAL LOW (ref 5–15)
BUN: 14 mg/dL (ref 6–23)
CALCIUM: 8.6 mg/dL (ref 8.4–10.5)
CO2: 26 mmol/L (ref 19–32)
Chloride: 110 mEq/L (ref 96–112)
Creatinine, Ser: 1.11 mg/dL (ref 0.50–1.35)
GFR, EST AFRICAN AMERICAN: 85 mL/min — AB (ref >90)
GFR, EST NON AFRICAN AMERICAN: 74 mL/min — AB (ref >90)
GLUCOSE: 102 mg/dL — AB (ref 70–99)
Potassium: 3.8 mmol/L (ref 3.5–5.1)
SODIUM: 140 mmol/L (ref 135–145)

## 2014-10-26 LAB — PROTIME-INR
INR: 1.06 (ref 0.00–1.49)
Prothrombin Time: 14 seconds (ref 11.6–15.2)

## 2014-10-26 SURGERY — LEFT HEART CATHETERIZATION WITH CORONARY ANGIOGRAM
Anesthesia: LOCAL

## 2014-10-26 MED ORDER — ASPIRIN 81 MG PO CHEW: 81.0000 mg | CHEWABLE_TABLET | Freq: Every day | ORAL | Status: DC

## 2014-10-26 MED ORDER — VERAPAMIL HCL 2.5 MG/ML IV SOLN
INTRAVENOUS | Status: AC
  Filled 2014-10-26: qty 2

## 2014-10-26 MED ORDER — FENTANYL CITRATE 0.05 MG/ML IJ SOLN
INTRAMUSCULAR | Status: AC
  Filled 2014-10-26: qty 2

## 2014-10-26 MED ORDER — SODIUM CHLORIDE 0.9 % IV SOLN
INTRAVENOUS | Status: AC
Start: 2014-10-26 — End: 2014-10-26

## 2014-10-26 MED ORDER — HEPARIN SODIUM (PORCINE) 1000 UNIT/ML IJ SOLN
INTRAMUSCULAR | Status: AC
  Filled 2014-10-26: qty 1

## 2014-10-26 MED ORDER — HEPARIN (PORCINE) IN NACL 2-0.9 UNIT/ML-% IJ SOLN
INTRAMUSCULAR | Status: AC
  Filled 2014-10-26: qty 500

## 2014-10-26 MED ORDER — NITROGLYCERIN 1 MG/10 ML FOR IR/CATH LAB
INTRA_ARTERIAL | Status: AC
  Filled 2014-10-26: qty 10

## 2014-10-26 MED ORDER — ONDANSETRON HCL 4 MG/2ML IJ SOLN: 4.0000 mg | Freq: Four times a day (QID) | INTRAMUSCULAR | Status: DC | PRN

## 2014-10-26 MED ORDER — MIDAZOLAM HCL 2 MG/2ML IJ SOLN
INTRAMUSCULAR | Status: AC
Start: 2014-10-26 — End: 2014-10-26
  Filled 2014-10-26: qty 2

## 2014-10-26 MED ORDER — LIDOCAINE HCL (PF) 1 % IJ SOLN
INTRAMUSCULAR | Status: AC
  Filled 2014-10-26: qty 30

## 2014-10-26 MED ORDER — ASPIRIN 81 MG PO CHEW
81.0000 mg | CHEWABLE_TABLET | Freq: Every day | ORAL | Status: DC
  Administered 2014-10-26 – 2014-10-27 (×2): 81 mg via ORAL
  Filled 2014-10-26 (×2): qty 1

## 2014-10-26 MED ORDER — ASPIRIN 81 MG PO CHEW: 81.0000 mg | CHEWABLE_TABLET | ORAL | Status: DC

## 2014-10-26 MED ORDER — HEPARIN (PORCINE) IN NACL 2-0.9 UNIT/ML-% IJ SOLN
INTRAMUSCULAR | Status: AC
  Filled 2014-10-26: qty 1000

## 2014-10-26 MED ORDER — SODIUM CHLORIDE 0.9 % IV SOLN
INTRAVENOUS | Status: DC
  Administered 2014-10-26: 07:00:00 via INTRAVENOUS

## 2014-10-26 NOTE — Progress Notes (Signed)
Subjective: He had stress testing done yesterday and had abnormalities on the imaging. He is being set for transfer to [facility] for cardiac catheterization.  Objective: Vital signs in last 24 hours: Temp:  [97.7 F (36.5 C)-99 F (37.2 C)] 97.7 F (36.5 C) (12/30 0644) Pulse Rate:  [64-76] 64 (12/30 0644) Resp:  [16-20] 20 (12/30 0644) BP: (118-127)/(82-89) 118/89 mmHg (12/30 0644) SpO2:  [96 %-97 %] 97 % (12/30 0644) Weight:  [103.964 kg (229 lb 3.2 oz)] 103.964 kg (229 lb 3.2 oz) (12/30 0700) Weight change:  Last BM Date: 10/24/14  Intake/Output from previous day: 12/29 0701 - 12/30 0700 In: 720 [P.O.:720] Out: 300 [Urine:300]  PHYSICAL EXAM General appearance: alert, cooperative and no distress Resp: clear to auscultation bilaterally Cardio: regular rate and rhythm, S1, S2 normal, no murmur, click, rub or gallop GI: soft, non-tender; bowel sounds normal; no masses,  no organomegaly Extremities: extremities normal, atraumatic, no cyanosis or edema  Lab Results:  Results for orders placed or performed during the hospital encounter of 10/23/14 (from the past 48 hour(s))  Troponin I (q 6hr x 3)     Status: None   Collection Time: 10/24/14 11:20 AM  Result Value Ref Range   Troponin I <0.03 <0.031 ng/mL    Comment:        NO INDICATION OF MYOCARDIAL INJURY. Please note change in reference range.   Troponin I (q 6hr x 3)     Status: None   Collection Time: 10/24/14  5:14 PM  Result Value Ref Range   Troponin I <0.03 <0.031 ng/mL    Comment:        NO INDICATION OF MYOCARDIAL INJURY. Please note change in reference range.   Basic metabolic panel     Status: Abnormal   Collection Time: 10/26/14  6:33 AM  Result Value Ref Range   Sodium 140 135 - 145 mmol/L    Comment: Please note change in reference range.   Potassium 3.8 3.5 - 5.1 mmol/L    Comment: Please note change in reference range.   Chloride 110 96 - 112 mEq/L   CO2 26 19 - 32 mmol/L   Glucose, Bld 102  (H) 70 - 99 mg/dL   BUN 14 6 - 23 mg/dL   Creatinine, Ser 1.11 0.50 - 1.35 mg/dL   Calcium 8.6 8.4 - 10.5 mg/dL   GFR calc non Af Amer 74 (L) >90 mL/min   GFR calc Af Amer 85 (L) >90 mL/min    Comment: (NOTE) The eGFR has been calculated using the CKD EPI equation. This calculation has not been validated in all clinical situations. eGFR's persistently <90 mL/min signify possible Chronic Kidney Disease.    Anion gap 4 (L) 5 - 15  Protime-INR     Status: None   Collection Time: 10/26/14  6:33 AM  Result Value Ref Range   Prothrombin Time 14.0 11.6 - 15.2 seconds   INR 1.06 0.00 - 1.49    ABGS No results for input(s): PHART, PO2ART, TCO2, HCO3 in the last 72 hours.  Invalid input(s): PCO2 CULTURES No results found for this or any previous visit (from the past 240 hour(s)). Studies/Results: US Carotid Bilateral  10/24/2014   CLINICAL DATA:  53 year old male with near syncope  EXAM: BILATERAL CAROTID DUPLEX ULTRASOUND  TECHNIQUE: Pearline Cables scale imaging, color Doppler and duplex ultrasound were performed of bilateral carotid and vertebral arteries in the neck.  COMPARISON:  None.  FINDINGS: Criteria: Quantification of carotid stenosis is based  on velocity parameters that correlate the residual internal carotid diameter with NASCET-based stenosis levels, using the diameter of the distal internal carotid lumen as the denominator for stenosis measurement.  The following velocity measurements were obtained:  RIGHT  ICA:  68/15 cm/sec  CCA:  49/67 cm/sec  SYSTOLIC ICA/CCA RATIO:  0.8  DIASTOLIC ICA/CCA RATIO:  0.7  ECA:  95 cm/sec  LEFT  ICA:  60/25 cm/sec  CCA:  A 5/91 cm/sec  SYSTOLIC ICA/CCA RATIO:  0.7  DIASTOLIC ICA/CCA RATIO:  1.2  ECA:  83 cm/sec  RIGHT CAROTID ARTERY: No significant atherosclerotic plaque or evidence of stenosis.  RIGHT VERTEBRAL ARTERY:  Patent with normal antegrade flow.  LEFT CAROTID ARTERY: No significant atherosclerotic plaque or evidence of stenosis.  LEFT VERTEBRAL  ARTERY:  Patent with normal antegrade flow.  IMPRESSION: Negative bilateral duplex carotid ultrasound.  Signed,  Criselda Peaches, MD  [facility]  [facility] Radiology   Electronically Signed   By: Jacqulynn Cadet M.D.   On: 10/24/2014 15:50   Nm Myocar Multi W/spect W/wall Motion / Ef  10/25/2014   CLINICAL DATA:  Patient is a 53 yo with dizziness and CP Test to evaluate, rule out ischemia.  EXAM: MYOCARDIAL IMAGING WITH SPECT (REST AND EXERCISE)  GATED LEFT VENTRICULAR WALL MOTION STUDY  LEFT VENTRICULAR EJECTION FRACTION  TECHNIQUE: Standard myocardial SPECT imaging was performed after resting intravenous injection of 10 mCi [id number]. Subsequently, exercise tolerance test was performed by the patient under the supervision of the Cardiology staff. At peak-stress, 30 mCi Tc-954mestamibi was injected intravenously and standard myocardial SPECT imaging was performed. Quantitative gated imaging was also performed to evaluate left ventricular wall motion, and estimate left ventricular ejection fraction.  COMPARISON:  None.  FINDINGS: Stress data: Baseline EKG SR 68 bpm Baseline BP 123/78 The patient exercised min 44 sec to a peak HR of 160 (96% maximal), peak BP 176/82. Test stopped because THR achieved. Patient had some mild dizziness but no CP  Nuclear data: In the initial stress images there was a small region of thinning with decreased tracer activity in the anterior wall (mid, distal) and the distal inferolateral wall. In the recovery images ther was improvement in this region consistent with a small region of mild ischemia.  IMPRESSION: Stress Cardiolite: Clinically and electrically negative for ischemia Cardiolite scan with mild ischemia in the anterior wall (mid/distal) and distal inferolateral wall. LVEF on gating calculated at 63%   Electronically Signed   By: PaDorris Carnes.D.   On: 10/25/2014 16:01    Medications:  Prior to Admission:   Prescriptions prior to admission  Medication Sig Dispense Refill Last Dose  . acetaminophen (TYLENOL) 500 MG tablet Take 1,000 mg by mouth every 6 (six) hours as needed for mild pain or moderate pain.   10/23/2014 at 1700  . aspirin EC 81 MG tablet Take 81 mg by mouth once.   10/23/2014 at Unknown time  . esomeprazole (NEXIUM) 40 MG capsule Take 40 mg by mouth daily before breakfast.    10/23/2014 at Unknown time  . Misc Natural Products (MENS PROSTATE HEALTH FORMULA PO) Take 1 tablet by mouth daily.   10/23/2014 at Unknown time  . Multiple Vitamin (MULTIVITAMIN WITH MINERALS) TABS tablet Take 1 tablet by mouth daily.   10/23/2014 at Unknown time  . olmesartan (BENICAR) 40 MG tablet Take 40 mg by mouth daily.   10/23/2014 at Unknown time  . rosuvastatin (CRESTOR) 10 MG tablet Take  10 mg by mouth daily.    10/23/2014 at Unknown time  . oxyCODONE-acetaminophen (PERCOCET/ROXICET) 5-325 MG per tablet Take one or two tabs po q 4 hrs prn pain (Patient not taking: Reported on 10/23/2014) 24 tablet 0   . promethazine (PHENERGAN) 25 MG tablet Take 1 tablet (25 mg total) by mouth every 6 (six) hours as needed for nausea. 15 tablet 0   . Tamsulosin HCl (FLOMAX) 0.4 MG CAPS Take 1 capsule (0.4 mg total) by mouth daily. (Patient not taking: Reported on 10/23/2014) 20 capsule 0    Scheduled: . aspirin  81 mg Oral Daily  . irbesartan  300 mg Oral Daily  . multivitamin with minerals  1 tablet Oral Daily  . pantoprazole  40 mg Oral Daily  . rosuvastatin  10 mg Oral q1800  . sodium chloride  3 mL Intravenous Q12H  . sodium chloride  3 mL Intravenous Q12H   Continuous: . sodium chloride 50 mL/hr at 10/26/14 0704   KFW:BLTGAI chloride, acetaminophen **OR** acetaminophen, acetaminophen, promethazine, sodium chloride, sodium chloride  Assesment: He was admitted with shortness of breath and near syncope. He had abnormal cardiac stress testing. He has risk factors including family history hypertension and  hyperlipidemia. Active Problems:   Shortness of breath   Near syncope   Dyspnea    Plan: He will have cardiac catheterization    LOS: 3 days   Anthonymichael Munday L 10/26/2014, 8:28 AM

## 2014-10-26 NOTE — CV Procedure (Signed)
     Left Heart Catheterization with Coronary Angiography  Report  Anthony Anderson  53 y.o.  male Apr 20, 1961  Procedure Date: 10/26/2014 Referring Physician: [name], M.D. Primary Cardiologist: Dorris Carnes, M.D.  INDICATIONS: Recurrent chest discomfort, dizziness, and mildly abnormal nuclear study suggesting anteroapical ischemia.  PROCEDURE: 1. Left heart catheterization; 2. Coronary angiography; 3. Left ventriculography  CONSENT:  The risks, benefits, and details of the procedure were explained in detail to the patient. Risks including death, stroke, heart attack, kidney injury, allergy, limb ischemia, bleeding and radiation injury were discussed.  The patient verbalized understanding and wanted to proceed.  Informed written consent was obtained.  PROCEDURE TECHNIQUE:  After Xylocaine anesthesia a 5 French Slender sheath was placed in the right radial artery with an angiocath and the modified Seldinger technique.  Coronary angiography was done using a 5 F JR4, JL 3.5 cm diagnostic and, EBU 3.0 and 3.5 cm guide catheters.  Left ventriculography was done using the JR 4 catheter and hand injection.   Digital images review did not reveal any significant obstruction. The case was terminated. Hemostasis was achieved with a wrist band.   CONTRAST:  Total of 80 cc.  COMPLICATIONS:  None  HEMODYNAMICS:  Aortic pressure 123/73 mmHg; LV pressure 133/5 mmHg; LVEDP 14 mmHg;  ANGIOGRAPHIC DATA:   The left main coronary artery is normal.  The left anterior descending artery is transapical. The vessel gives origin to a large first diagonal. The first diagonal bifurcates on the lateral wall. The mid LAD near the first septal perforator contains eccentric 30% narrowing with mild lucency in the LAO cranial view. No clinically significant disease is noted in the LAD territory..  The left circumflex artery is widely patent. 2 obtuse marginal branches arise and are free of any significant  obstruction.  The right coronary artery is dominant and normal..  LEFT VENTRICULOGRAM:  Left ventricular angiogram was done in the 30 RAO projection and revealed normal left ventricular size and function. EF 55%   IMPRESSIONS:  1. Widely patent coronary arteries with no evidence of clinically significant coronary disease. A 30% proximal region of the irregularity is noted in the mid LAD.  2. Normal LV function     RECOMMENDATION:  Risk factor modification. No angiographically significant coronary disease is noted. I would recommend aspirin therapy and statin therapy given the plaque noted in the LAD. Further management per treating team..

## 2014-10-26 NOTE — Progress Notes (Signed)
Pre cath orders written. NPO status and IV hydration already ordered by Dr. Harrington Challenger. Will d/c Lovenox for today and add daily aspirin. [facility] called for transfer to [facility]. Trish aware. Donnisha Besecker PA-C

## 2014-10-26 NOTE — Progress Notes (Signed)
UR completed 

## 2014-10-26 NOTE — Interval H&P Note (Signed)
Cath Lab Visit (complete for each Cath Lab visit)  Clinical Evaluation Leading to the Procedure:   ACS: No.  Non-ACS:    Anginal Classification: CCS III  Anti-ischemic medical therapy: Minimal Therapy (1 class of medications)  Non-Invasive Test Results: Intermediate-risk stress test findings: cardiac mortality 1-3%/year  Prior CABG: No previous CABG      History and Physical Interval Note:  10/26/2014 3:20 PM  Anthony Anderson  has presented today for surgery, with the diagnosis of cp  The various methods of treatment have been discussed with the patient and family. After consideration of risks, benefits and other options for treatment, the patient has consented to  Procedure(s): LEFT HEART CATHETERIZATION WITH CORONARY ANGIOGRAM (N/A) as a surgical intervention .  The patient's history has been reviewed, patient examined, no change in status, stable for surgery.  I have reviewed the patient's chart and labs.  Questions were answered to the patient's satisfaction.     Sinclair Grooms

## 2014-10-26 NOTE — Progress Notes (Signed)
Patient transferring to [facility] today for cardiac catheterization.  Report called to receiving RN on 2W.  No questions or concerns at this time.  Patient was consented and 2 new IVs started in Left wrist and Left forearm per orders.  Patient left unit in stable condition with [facility].  Patient's wife took patient's belongings with her to be taken to [facility].

## 2014-10-26 NOTE — Progress Notes (Signed)
Subjective: Patinet not in room   Objective: Filed Vitals:   10/25/14 1500 10/25/14 2232 10/26/14 0644 10/26/14 0700  BP: 127/82  118/89   Pulse: 75  64   Temp: 98.6 F (37 C) 99 F (37.2 C) 97.7 F (36.5 C)   TempSrc:  Oral Oral   Resp: 16 20 20    Height:      Weight:    229 lb 3.2 oz (103.964 kg)  SpO2: 97% 97% 97%    Weight change:   Intake/Output Summary (Last 24 hours) at 10/26/14 0830 Last data filed at 10/25/14 1200  Gross per 24 hour  Intake    480 ml  Output    300 ml  Net    180 ml   Exam:  Patient not in room  Lab Results: Results for orders placed or performed during the hospital encounter of 10/23/14 (from the past 24 hour(s))  Basic metabolic panel     Status: Abnormal   Collection Time: 10/26/14  6:33 AM  Result Value Ref Range   Sodium 140 135 - 145 mmol/L   Potassium 3.8 3.5 - 5.1 mmol/L   Chloride 110 96 - 112 mEq/L   CO2 26 19 - 32 mmol/L   Glucose, Bld 102 (H) 70 - 99 mg/dL   BUN 14 6 - 23 mg/dL   Creatinine, Ser 1.11 0.50 - 1.35 mg/dL   Calcium 8.6 8.4 - 10.5 mg/dL   GFR calc non Af Amer 74 (L) >90 mL/min   GFR calc Af Amer 85 (L) >90 mL/min   Anion gap 4 (L) 5 - 15  Protime-INR     Status: None   Collection Time: 10/26/14  6:33 AM  Result Value Ref Range   Prothrombin Time 14.0 11.6 - 15.2 seconds   INR 1.06 0.00 - 1.49    Studies/Results: Nm Myocar Multi W/spect W/wall Motion / Ef  10/25/2014   CLINICAL DATA:  Patient is a 53 yo with dizziness and CP Test to evaluate, rule out ischemia.  EXAM: MYOCARDIAL IMAGING WITH SPECT (REST AND EXERCISE)  GATED LEFT VENTRICULAR WALL MOTION STUDY  LEFT VENTRICULAR EJECTION FRACTION  TECHNIQUE: Standard myocardial SPECT imaging was performed after resting intravenous injection of 10 mCi Tc-79m sestamibi. Subsequently, exercise tolerance test was performed by the patient under the supervision of the Cardiology staff. At peak-stress, 30 mCi Tc-2m sestamibi was injected intravenously and standard  myocardial SPECT imaging was performed. Quantitative gated imaging was also performed to evaluate left ventricular wall motion, and estimate left ventricular ejection fraction.  COMPARISON:  None.  FINDINGS: Stress data: Baseline EKG SR 68 bpm Baseline BP 123/78 The patient exercised min 44 sec to a peak HR of 160 (96% maximal), peak BP 176/82. Test stopped because THR achieved. Patient had some mild dizziness but no CP  Nuclear data: In the initial stress images there was a small region of thinning with decreased tracer activity in the anterior wall (mid, distal) and the distal inferolateral wall. In the recovery images ther was improvement in this region consistent with a small region of mild ischemia.  IMPRESSION: Stress Cardiolite: Clinically and electrically negative for ischemia Cardiolite scan with mild ischemia in the anterior wall (mid/distal) and distal inferolateral wall. LVEF on gating calculated at 63%   Electronically Signed   By: Dorris Carnes M.D.   On: 10/25/2014 16:01    Medications:  Reviewed   @PROBHOSP @  1.  SOB/Dizziness/CP Patient had stress myoview yesterday  Exercised 8:44  Became  dizzy  EKG without changes. Myoview scan with mild anterior ischemia.  Appeared significant Echo showed hyperdynamic LV funciton  I discussed with patinet and with Dr Luan Pulling  Given all of symptoms and abnormal nuclear scan would recomm L heart cath to define anatamy  Risks/benefits explained  Yesterday  Patinet understands and agrees to proceed.  Plan for tx to [facility] later today.    LOS: 3 days   Dorris Carnes 10/26/2014, 8:30 AM

## 2014-10-26 NOTE — H&P (View-Only) (Signed)
Primary Physician: Primary Cardiologist:   HPI:  Anthony Anderson is a 53 yo who we are asked to see re dizziness, SOB The patient has a history of HTN, HL.  Has had dizziness intermitt for 1 month.  Yesterday was working on deck  Around noon he became dizzy, SOB  Lasted less than 5 min  Sat down.  Afterward patient went back to work on deck though at a slower pace.  Worked til about 4:30 PM  Says he was drinking fluids.   Denies lightheadedness.  No SOB Went to sisters hous for dinner  Around 7:30 PM while on sofa became very SOB and developed chest tightness  thghtne  Was 3/10 in intensity  Felt like he was going to pass out.  Brought to ER In ER chst tightness was 1/10  Still didn't feel right Today felt OK  No dizziness  No tightness Laid in bed.       Past Medical History  Diagnosis Date  . Kidney stones   . Hypertension   . Hyperlipidemia   . GERD (gastroesophageal reflux disease)   . Cholelithiasis   . PSA elevation     Medications Prior to Admission  Medication Sig Dispense Refill  . acetaminophen (TYLENOL) 500 MG tablet Take 1,000 mg by mouth every 6 (six) hours as needed for mild pain or moderate pain.    Marland Kitchen aspirin EC 81 MG tablet Take 81 mg by mouth once.    Marland Kitchen esomeprazole (NEXIUM) 40 MG capsule Take 40 mg by mouth daily before breakfast.     . Misc Natural Products (MENS PROSTATE HEALTH FORMULA PO) Take 1 tablet by mouth daily.    . Multiple Vitamin (MULTIVITAMIN WITH MINERALS) TABS tablet Take 1 tablet by mouth daily.    Marland Kitchen olmesartan (BENICAR) 40 MG tablet Take 40 mg by mouth daily.    . rosuvastatin (CRESTOR) 10 MG tablet Take 10 mg by mouth daily.     Marland Kitchen oxyCODONE-acetaminophen (PERCOCET/ROXICET) 5-325 MG per tablet Take one or two tabs po q 4 hrs prn pain (Patient not taking: Reported on 10/23/2014) 24 tablet 0  . promethazine (PHENERGAN) 25 MG tablet Take 1 tablet (25 mg total) by mouth every 6 (six) hours as needed for nausea. 15 tablet 0  . Tamsulosin HCl (FLOMAX) 0.4  MG CAPS Take 1 capsule (0.4 mg total) by mouth daily. (Patient not taking: Reported on 10/23/2014) 20 capsule 0     . enoxaparin (LOVENOX) injection  40 mg Subcutaneous Q24H  . irbesartan  300 mg Oral Daily  . multivitamin with minerals  1 tablet Oral Daily  . pantoprazole  40 mg Oral Daily  . rosuvastatin  10 mg Oral q1800  . sodium chloride  3 mL Intravenous Q12H  . sodium chloride  3 mL Intravenous Q12H    Infusions:    No Known Allergies  History   Social History  . Marital Status: Married    Spouse Name: N/A    Number of Children: N/A  . Years of Education: N/A   Occupational History  . Not on file.   Social History Main Topics  . Smoking status: Former Smoker -- 1.00 packs/day for 10 years    Types: Cigarettes    Quit date: 09/20/1982  . Smokeless tobacco: Never Used  . Alcohol Use: No  . Drug Use: No  . Sexual Activity: Not on file   Other Topics Concern  . Not on file   Social History Narrative  Family History  Problem Relation Age of Onset  . CAD Mother   . CAD Father   . Prostate cancer Father     REVIEW OF SYSTEMS:  All systems reviewed  Negative to the above problem except as noted above.    PHYSICAL EXAM: Filed Vitals:   10/24/14 1459  BP: 127/81  Pulse: 62  Temp: 97.7 F (36.5 C)  Resp:      Intake/Output Summary (Last 24 hours) at 10/24/14 1601 Last data filed at 10/24/14 1500  Gross per 24 hour  Intake      0 ml  Output    200 ml  Net   -200 ml    General:  Well appearing. No respiratory difficulty HEENT: normal Neck: supple. no JVD. Carotids 2+ bilat; no bruits. No lymphadenopathy or thryomegaly appreciated. Cor: PMI nondisplaced. Regular rate & rhythm. No rubs, gallops or murmurs. Lungs: clear Abdomen: soft, nontender, nondistended. No hepatosplenomegaly. No bruits or masses. Good bowel sounds. Extremities: no cyanosis, clubbing, rash, edema Neuro: alert & oriented x 3, cranial nerves grossly intact. moves all 4  extremities w/o difficulty. Affect pleasant.  ECG:  SR 92 bpm   Nonspecific ST T wave changes    Results for orders placed or performed during the hospital encounter of 10/23/14 (from the past 24 hour(s))  CBC     Status: None   Collection Time: 10/23/14 11:21 PM  Result Value Ref Range   WBC 6.4 4.0 - 10.5 K/uL   RBC 5.24 4.22 - 5.81 MIL/uL   Hemoglobin 15.3 13.0 - 17.0 g/dL   HCT 45.8 39.0 - 52.0 %   MCV 87.4 78.0 - 100.0 fL   MCH 29.2 26.0 - 34.0 pg   MCHC 33.4 30.0 - 36.0 g/dL   RDW 13.0 11.5 - 15.5 %   Platelets 214 150 - 400 K/uL  Basic metabolic panel     Status: Abnormal   Collection Time: 10/23/14 11:21 PM  Result Value Ref Range   Sodium 144 135 - 145 mmol/L   Potassium 3.8 3.5 - 5.1 mmol/L   Chloride 113 (H) 96 - 112 mEq/L   CO2 25 19 - 32 mmol/L   Glucose, Bld 108 (H) 70 - 99 mg/dL   BUN 19 6 - 23 mg/dL   Creatinine, Ser 1.35 0.50 - 1.35 mg/dL   Calcium 9.1 8.4 - 10.5 mg/dL   GFR calc non Af Amer 58 (L) >90 mL/min   GFR calc Af Amer 67 (L) >90 mL/min   Anion gap 6 5 - 15  BNP (order ONLY if patient complains of dyspnea/SOB AND you have documented it for THIS visit)     Status: None   Collection Time: 10/23/14 11:21 PM  Result Value Ref Range   B Natriuretic Peptide 13.0 0.0 - 100.0 pg/mL  Troponin I (MHP)     Status: None   Collection Time: 10/23/14 11:21 PM  Result Value Ref Range   Troponin I <0.03 <0.031 ng/mL  D-dimer, quantitative     Status: None   Collection Time: 10/23/14 11:42 PM  Result Value Ref Range   D-Dimer, Quant 0.41 0.00 - 0.48 ug/mL-FEU  TSH     Status: None   Collection Time: 10/24/14  5:27 AM  Result Value Ref Range   TSH 2.288 0.350 - 4.500 uIU/mL  Hemoglobin A1c     Status: None   Collection Time: 10/24/14  5:27 AM  Result Value Ref Range   Hgb A1c MFr Bld 5.6 <5.7 %  Mean Plasma Glucose 114 <117 mg/dL  Troponin I (q 6hr x 3)     Status: None   Collection Time: 10/24/14  5:27 AM  Result Value Ref Range   Troponin I <0.03  <0.031 ng/mL  Troponin I (q 6hr x 3)     Status: None   Collection Time: 10/24/14 11:20 AM  Result Value Ref Range   Troponin I <0.03 <0.031 ng/mL   US Carotid Bilateral  10/24/2014   CLINICAL DATA:  53 year old male with near syncope  EXAM: BILATERAL CAROTID DUPLEX ULTRASOUND  TECHNIQUE: Pearline Cables scale imaging, color Doppler and duplex ultrasound were performed of bilateral carotid and vertebral arteries in the neck.  COMPARISON:  None.  FINDINGS: Criteria: Quantification of carotid stenosis is based on velocity parameters that correlate the residual internal carotid diameter with NASCET-based stenosis levels, using the diameter of the distal internal carotid lumen as the denominator for stenosis measurement.  The following velocity measurements were obtained:  RIGHT  ICA:  68/15 cm/sec  CCA:  50/03 cm/sec  SYSTOLIC ICA/CCA RATIO:  0.8  DIASTOLIC ICA/CCA RATIO:  0.7  ECA:  95 cm/sec  LEFT  ICA:  60/25 cm/sec  CCA:  A 7/04 cm/sec  SYSTOLIC ICA/CCA RATIO:  0.7  DIASTOLIC ICA/CCA RATIO:  1.2  ECA:  83 cm/sec  RIGHT CAROTID ARTERY: No significant atherosclerotic plaque or evidence of stenosis.  RIGHT VERTEBRAL ARTERY:  Patent with normal antegrade flow.  LEFT CAROTID ARTERY: No significant atherosclerotic plaque or evidence of stenosis.  LEFT VERTEBRAL ARTERY:  Patent with normal antegrade flow.  IMPRESSION: Negative bilateral duplex carotid ultrasound.  Signed,  Criselda Peaches, MD  [facility]  [facility]   Electronically Signed   By: Jacqulynn Cadet M.D.   On: 10/24/2014 15:50   Dg Chest Port 1 View  10/23/2014   CLINICAL DATA:  Shortness of breath.  EXAM: PORTABLE CHEST - 1 VIEW  COMPARISON:  08/21/2006  FINDINGS: Stable appearance of the heart, with size likely accentuated by lower mediastinal fat. Stable widening of the lower mediastinum related to known hiatal hernia. There is no edema, consolidation, effusion, or pneumothorax.  IMPRESSION: No active  disease.   Electronically Signed   By: Jorje Guild M.D.   On: 10/23/2014 23:22     ASSESSMENT:  53 yo with no prior cardiac history  Over past 1 month has had mild intermitt dizziness  Yest was worse  Assoc with some SOB and some chst tightness.  R/O for MI  D Dimer neg Would check orhtostatics  Continue telemetry  Set up for stress myoview in AM    2.  HTN  Follow    3  HL  Continue statin.

## 2014-10-27 DIAGNOSIS — I2510 Atherosclerotic heart disease of native coronary artery without angina pectoris: Principal | ICD-10-CM

## 2014-10-27 DIAGNOSIS — I10 Essential (primary) hypertension: Secondary | ICD-10-CM

## 2014-10-27 DIAGNOSIS — K219 Gastro-esophageal reflux disease without esophagitis: Secondary | ICD-10-CM

## 2014-10-27 NOTE — Discharge Summary (Signed)
CARDIOLOGY DISCHARGE SUMMARY   Patient ID: Anthony Anderson MRN: [id number] DOB/AGE: 06/09/1961 53 y.o.  Admit date: 10/23/2014 Discharge date: 10/29/2014  PCP: Alonza Bogus, MD Primary Cardiologist: Dr. Gerri Lins  Primary Discharge Diagnosis:    Dyspnea Secondary Discharge Diagnosis:    Shortness of breath   Near syncope   Essential hypertension   Hyperlipidemia   GERD (gastroesophageal reflux disease)   Chest pain  Procedures: 1. Left heart catheterization; 2. Coronary angiography; 3. Left ventriculography; 4. 2 D Echocardiogram; 5. Carotid US; 6. [place] Hospital Course: Anthony Anderson is a 53 y.o. male with no history of CAD. He had been having problems with dizziness, and then developed chest tightness. He came to the emergency room and was seen there by Dr. Harrington Challenger. He was admitted for further evaluation and treatment.  His cardiac enzymes were negative for MI. However symptoms were concerning and a stress Myoview was performed. The results are below. It showed ischemia in 2 different areas, EF 63%. An echocardiogram was also performed that showed a normal EF, results are below.  Because he had no history and his stress test was abnormal, he was transferred to [facility] for cardiac catheterization.  Cardiac catheterization was performed on 10/26/2014. Results are below. He had nonobstructive plaque in multiple vessels and a preserved EF.  On 12/31, he was seen by Dr. Marlou Porch and all data were reviewed. His chest pain and dizziness had resolved. He was monitored on telemetry during his hospital stay and had no significant arrhythmias. He ambulated without chest pain or shortness of breath and had no dizziness with ambulation. No further inpatient workup was indicated and he is considered stable for discharge, to follow-up as an outpatient.  Labs:   Lab Results  Component Value Date   WBC 6.4 10/23/2014   HGB 15.3 10/23/2014   HCT 45.8 10/23/2014   MCV 87.4  10/23/2014   PLT 214 10/23/2014     Recent Labs Lab 10/26/14 0633  NA 140  K 3.8  CL 110  CO2 26  BUN 14  CREATININE 1.11  CALCIUM 8.6  GLUCOSE 102*   Lab Results  Component Value Date   TROPONINI <0.03 10/24/2014    Radiology: US Carotid Bilateral 10/24/2014   CLINICAL DATA:  53 year old male with near syncope  EXAM: BILATERAL CAROTID DUPLEX ULTRASOUND  TECHNIQUE: Pearline Cables scale imaging, color Doppler and duplex ultrasound were performed of bilateral carotid and vertebral arteries in the neck.  COMPARISON:  None.  FINDINGS: Criteria: Quantification of carotid stenosis is based on velocity parameters that correlate the residual internal carotid diameter with NASCET-based stenosis levels, using the diameter of the distal internal carotid lumen as the denominator for stenosis measurement.  The following velocity measurements were obtained:  RIGHT  ICA:  68/15 cm/sec  CCA:  08/67 cm/sec  SYSTOLIC ICA/CCA RATIO:  0.8  DIASTOLIC ICA/CCA RATIO:  0.7  ECA:  95 cm/sec  LEFT  ICA:  60/25 cm/sec  CCA:  A 6/19 cm/sec  SYSTOLIC ICA/CCA RATIO:  0.7  DIASTOLIC ICA/CCA RATIO:  1.2  ECA:  83 cm/sec  RIGHT CAROTID ARTERY: No significant atherosclerotic plaque or evidence of stenosis.  RIGHT VERTEBRAL ARTERY:  Patent with normal antegrade flow.  LEFT CAROTID ARTERY: No significant atherosclerotic plaque or evidence of stenosis.  LEFT VERTEBRAL ARTERY:  Patent with normal antegrade flow.  IMPRESSION: Negative bilateral duplex carotid ultrasound.  Signed,  Criselda Peaches, MD  [facility]  [facility]  Electronically Signed   By: Jacqulynn Cadet M.D.   On: 10/24/2014 15:50   Nm Myocar Multi W/spect W/wall Motion / Ef 10/25/2014   CLINICAL DATA:  Patient is a 53 yo with dizziness and CP Test to evaluate, rule out ischemia.  EXAM: MYOCARDIAL IMAGING WITH SPECT (REST AND EXERCISE)  GATED LEFT VENTRICULAR WALL MOTION STUDY  LEFT VENTRICULAR EJECTION FRACTION   TECHNIQUE: Standard myocardial SPECT imaging was performed after resting intravenous injection of 10 mCi Tc-71m sestamibi. Subsequently, exercise tolerance test was performed by the patient under the supervision of the Cardiology staff. At peak-stress, 30 mCi Tc-30m sestamibi was injected intravenously and standard myocardial SPECT imaging was performed. Quantitative gated imaging was also performed to evaluate left ventricular wall motion, and estimate left ventricular ejection fraction.  COMPARISON:  None.  FINDINGS: Stress data: Baseline EKG SR 68 bpm Baseline BP 123/78 The patient exercised min 44 sec to a peak HR of 160 (96% maximal), peak BP 176/82. Test stopped because THR achieved. Patient had some mild dizziness but no CP  Nuclear data: In the initial stress images there was a small region of thinning with decreased tracer activity in the anterior wall (mid, distal) and the distal inferolateral wall. In the recovery images ther was improvement in this region consistent with a small region of mild ischemia.  IMPRESSION: Stress Cardiolite: Clinically and electrically negative for ischemia Cardiolite scan with mild ischemia in the anterior wall (mid/distal) and distal inferolateral wall. LVEF on gating calculated at 63%   Electronically Signed   By: Dorris Carnes M.D.   On: 10/25/2014 16:01   Dg Chest Port 1 View 10/23/2014   CLINICAL DATA:  Shortness of breath.  EXAM: PORTABLE CHEST - 1 VIEW  COMPARISON:  08/21/2006  FINDINGS: Stable appearance of the heart, with size likely accentuated by lower mediastinal fat. Stable widening of the lower mediastinum related to known hiatal hernia. There is no edema, consolidation, effusion, or pneumothorax.  IMPRESSION: No active disease.   Electronically Signed   By: Jorje Guild M.D.   On: 10/23/2014 23:22    Cardiac Cath: 10/26/2014 ANGIOGRAPHIC DATA: The left main coronary artery is normal. The left anterior descending artery is transapical. The vessel gives  origin to a large first diagonal. The first diagonal bifurcates on the lateral wall. The mid LAD near the first septal perforator contains eccentric 30% narrowing with mild lucency in the LAO cranial view. No clinically significant disease is noted in the LAD territory.. The left circumflex artery is widely patent. 2 obtuse marginal branches arise and are free of any significant obstruction. The right coronary artery is dominant and normal.. LEFT VENTRICULOGRAM: Left ventricular angiogram was done in the 30 RAO projection and revealed normal left ventricular size and function. EF 55% IMPRESSIONS: 1. Widely patent coronary arteries with no evidence of clinically significant coronary disease. A 30% proximal region of the irregularity is noted in the mid LAD. 2. Normal LV function RECOMMENDATION: Risk factor modification. No angiographically significant coronary disease is noted. I would recommend aspirin therapy and statin therapy given the plaque noted in the LAD. Further management per treating team.  EKG: 10/23/2014 SR, no acute ischemic changes  Echo: 10/25/2014 Study Conclusions - Left ventricle: Vigorous LV systolic function. The cavity size was normal. Wall thickness was increased in a pattern of mild LVH. Systolic function was vigorous. The estimated ejection fraction was in the range of 65% to 70%. - Mitral valve: There was mild regurgitation.  FOLLOW UP PLANS AND  APPOINTMENTS No Known Allergies   Medication List    STOP taking these medications        promethazine 25 MG tablet  Commonly known as:  PHENERGAN      TAKE these medications        acetaminophen 500 MG tablet  Commonly known as:  TYLENOL  Take 1,000 mg by mouth every 6 (six) hours as needed for mild pain or moderate pain.     aspirin EC 81 MG tablet  Take 81 mg by mouth once.     esomeprazole 40 MG capsule  Commonly known as:  NEXIUM  Take 40 mg by mouth daily before breakfast.     MENS PROSTATE  HEALTH FORMULA PO  Take 1 tablet by mouth daily.     multivitamin with minerals Tabs tablet  Take 1 tablet by mouth daily.     olmesartan 40 MG tablet  Commonly known as:  BENICAR  Take 40 mg by mouth daily.     oxyCODONE-acetaminophen 5-325 MG per tablet  Commonly known as:  PERCOCET/ROXICET  Take one or two tabs po q 4 hrs prn pain     rosuvastatin 10 MG tablet  Commonly known as:  CRESTOR  Take 10 mg by mouth daily.     tamsulosin 0.4 MG Caps capsule  Commonly known as:  FLOMAX  Take 1 capsule (0.4 mg total) by mouth daily.        Discharge Instructions    Diet - low sodium heart healthy    Complete by:  As directed      Increase activity slowly    Complete by:  As directed           Follow-up Information    Follow up with Dorris Carnes, MD.   Specialty:  Cardiology   Why:  As needed. Can also all Cardiology in [place] at [phone] to be seen there.   Contact information:   [place] [address] [phone]       BRING ALL MEDICATIONS WITH YOU TO FOLLOW UP APPOINTMENTS  Time spent with patient to include physician time: 38 min Signed: Rosaria Ferries, PA-C 10/29/2014, 7:25 PM Co-Sign MD  Personally seen and examined. Agree with above. Cardiac catheterization reassuring. Candee Furbish, MD

## 2014-10-27 NOTE — Progress Notes (Signed)
Patient Name: Anthony Anderson Date of Encounter: 10/27/2014  Active Problems:   Shortness of breath   Near syncope   Dyspnea   Essential hypertension   Hyperlipidemia   GERD (gastroesophageal reflux disease)   Chest pain   Primary Cardiologist: Linna Hoff, Dr Harrington Challenger  Patient Profile: 53 yo male w/ hx HTN, HLD, fam hx CAD.  Admitted to [facility] w/ chest pain 12/28, MV abnl, tx to [facility] for cath 12/30. Cath OK.   SUBJECTIVE: No more chest pain, no dizziness, no SOB  OBJECTIVE Filed Vitals:   10/26/14 1845 10/26/14 2009 10/27/14 0019 10/27/14 0500  BP: 132/86 122/77 102/65 109/60  Pulse: 66 76 65 62  Temp:  98.4 F (36.9 C) 98.1 F (36.7 C) 97.8 F (36.6 C)  TempSrc:  Oral Oral Oral  Resp:  18 18 18   Height:      Weight:    229 lb 8 oz (104.1 kg)  SpO2: 97% 95% 97% 97%    Intake/Output Summary (Last 24 hours) at 10/27/14 0841 Last data filed at 10/27/14 0535  Gross per 24 hour  Intake      0 ml  Output    200 ml  Net   -200 ml   Filed Weights   10/26/14 0700 10/26/14 1140 10/27/14 0500  Weight: 229 lb 3.2 oz (103.964 kg) 236 lb 1.8 oz (107.1 kg) 229 lb 8 oz (104.1 kg)    PHYSICAL EXAM General: Well developed, well nourished, male in no acute distress. Head: Normocephalic, atraumatic.  Neck: Supple without bruits, JVD not elevated. Lungs:  Resp regular and unlabored, CTA. Heart: RRR, S1, S2, no S3, S4, soft murmur; no rub. Abdomen: Soft, non-tender, non-distended, BS + x 4.  Extremities: No clubbing, cyanosis, no edema. Right radial without ecchymosis/hematoma Neuro: Alert and oriented X 3. Moves all extremities spontaneously. Psych: Normal affect.  LABS: INR: Recent Labs  10/26/14 0633  INR 1.97   Basic Metabolic Panel: Recent Labs  10/26/14 0633  NA 140  K 3.8  CL 110  CO2 26  GLUCOSE 102*  BUN 14  CREATININE 1.11  CALCIUM 8.6   Cardiac Enzymes: Recent Labs  10/24/14 1120 10/24/14 1714  TROPONINI <0.03 <0.03    TELE:   SR, no sig  ectopy     Radiology/Studies: Nm Myocar Multi W/spect W/wall Motion / Ef 10/25/2014   CLINICAL DATA:  Patient is a 53 yo with dizziness and CP Test to evaluate, rule out ischemia.  EXAM: MYOCARDIAL IMAGING WITH SPECT (REST AND EXERCISE)  GATED LEFT VENTRICULAR WALL MOTION STUDY  LEFT VENTRICULAR EJECTION FRACTION  TECHNIQUE: Standard myocardial SPECT imaging was performed after resting intravenous injection of 10 mCi Tc-45m sestamibi. Subsequently, exercise tolerance test was performed by the patient under the supervision of the Cardiology staff. At peak-stress, 30 mCi Tc-29m sestamibi was injected intravenously and standard myocardial SPECT imaging was performed. Quantitative gated imaging was also performed to evaluate left ventricular wall motion, and estimate left ventricular ejection fraction.  COMPARISON:  None.  FINDINGS: Stress data: Baseline EKG SR 68 bpm Baseline BP 123/78 The patient exercised min 44 sec to a peak HR of 160 (96% maximal), peak BP 176/82. Test stopped because THR achieved. Patient had some mild dizziness but no CP  Nuclear data: In the initial stress images there was a small region of thinning with decreased tracer activity in the anterior wall (mid, distal) and the distal inferolateral wall. In the recovery images ther was improvement in this region  consistent with a small region of mild ischemia.  IMPRESSION: Stress Cardiolite: Clinically and electrically negative for ischemia Cardiolite scan with mild ischemia in the anterior wall (mid/distal) and distal inferolateral wall. LVEF on gating calculated at 63%   Electronically Signed   By: Dorris Carnes M.D.   On: 10/25/2014 16:01     Current Medications:  . aspirin  81 mg Oral Daily  . irbesartan  300 mg Oral Daily  . multivitamin with minerals  1 tablet Oral Daily  . pantoprazole  40 mg Oral Daily  . rosuvastatin  10 mg Oral q1800      ASSESSMENT AND PLAN: Active Problems:   Shortness of breath - CXR at [facility] OK, ddimer  negative, no recent tobacco use.     Near syncope - dizziness, no arrhythmia on tele review, Advised them to ck BP/HR when he has sx, may be helpful    Dyspnea - see above    Essential hypertension - SBP 102-132 last 24 hours, on home rx    Hyperlipidemia - on home statin, f/u with Dr Luan Pulling    GERD (gastroesophageal reflux disease) - continue Nexium    Chest pain - resolved, cath OK, Dr. Tamala Julian recommends continuing statin and adding ASA for mild plaque.   Plan - d/c today.  SignedRosaria Ferries , PA-C 8:41 AM 10/27/2014  Personally seen and examined. Agree with above. [place] for DC. Candee Furbish, MD

## 2014-10-27 NOTE — Discharge Instructions (Signed)
PLEASE REMEMBER TO BRING ALL OF YOUR MEDICATIONS TO EACH OF YOUR FOLLOW-UP OFFICE VISITS. ° °PLEASE ATTEND ALL SCHEDULED FOLLOW-UP APPOINTMENTS.  ° °Activity: Increase activity slowly as tolerated. You may shower, but no soaking baths (or swimming) for 1 week. No driving for 2 days. No lifting over 5 lbs for 1 week. No sexual activity for 1 week.  ° °You May Return to Work: in 1 week (if applicable) ° °Wound Care: You may wash cath site gently with soap and water. Keep cath site clean and dry. If you notice pain, swelling, bleeding or pus at your cath site, please call [phone]. ° ° ° °Cardiac Cath Site Care °Refer to this sheet in the next few weeks. These instructions provide you with information on caring for yourself after your procedure. Your caregiver may also give you more specific instructions. Your treatment has been planned according to current medical practices, but problems sometimes occur. Call your caregiver if you have any problems or questions after your procedure. °HOME CARE INSTRUCTIONS °· You may shower 24 hours after the procedure. Remove the bandage (dressing) and gently wash the site with plain soap and water. Gently pat the site dry.  °· Do not apply powder or lotion to the site.  °· Do not sit in a bathtub, swimming pool, or whirlpool for 5 to 7 days.  °· No bending, squatting, or lifting anything over 10 pounds (4.5 kg) as directed by your caregiver.  °· Inspect the site at least twice daily.  °· Do not drive home if you are discharged the same day of the procedure. Have someone else drive you.  °· You may drive 24 hours after the procedure unless otherwise instructed by your caregiver.  °What to expect: °· Any bruising will usually fade within 1 to 2 weeks.  °· Blood that collects in the tissue (hematoma) may be painful to the touch. It should usually decrease in size and tenderness within 1 to 2 weeks.  °SEEK IMMEDIATE MEDICAL CARE IF: °· You have unusual pain at the site or down the  affected limb.  °· You have redness, warmth, swelling, or pain at the site.  °· You have drainage (other than a small amount of blood on the dressing).  °· You have chills.  °· You have a fever or persistent symptoms for more than 72 hours.  °· You have a fever and your symptoms suddenly get worse.  °· Your leg becomes pale, cool, tingly, or numb.  °· You have heavy bleeding from the site. Hold pressure on the site.  °Document Released: 11/16/2010 Document Revised: 10/03/2011 Document Reviewed: 11/16/2010 °ExitCare® Patient Information ©2012 [employer]. ° °

## 2014-10-27 NOTE — Progress Notes (Signed)
Received order however sts "If PCI" and pt did not receive PCI. Will not follow unless reordered. Yves Dill CES, ACSM 7:46 AM 10/27/2014

## 2014-11-01 ENCOUNTER — Other Ambulatory Visit (HOSPITAL_COMMUNITY): Payer: Self-pay | Admitting: Respiratory Therapy

## 2014-11-01 ENCOUNTER — Other Ambulatory Visit (HOSPITAL_COMMUNITY): Payer: Self-pay | Admitting: Pulmonary Disease

## 2014-11-01 DIAGNOSIS — R42 Dizziness and giddiness: Secondary | ICD-10-CM

## 2014-11-01 DIAGNOSIS — R0602 Shortness of breath: Secondary | ICD-10-CM

## 2014-11-02 ENCOUNTER — Other Ambulatory Visit (HOSPITAL_COMMUNITY): Payer: Self-pay | Admitting: Respiratory Therapy

## 2014-11-02 DIAGNOSIS — G4730 Sleep apnea, unspecified: Secondary | ICD-10-CM

## 2014-11-03 ENCOUNTER — Ambulatory Visit (HOSPITAL_COMMUNITY)
Admission: RE | Admit: 2014-11-03 | Discharge: 2014-11-03 | Disposition: A | Discharge status: Home / Self Care | Payer: BLUE CROSS/BLUE SHIELD | Source: Ambulatory Visit | Attending: Pulmonary Disease | Admitting: Pulmonary Disease

## 2014-11-03 DIAGNOSIS — R42 Dizziness and giddiness: Secondary | ICD-10-CM

## 2014-11-03 DIAGNOSIS — G3189 Other specified degenerative diseases of nervous system: Secondary | ICD-10-CM | POA: Diagnosis not present

## 2014-11-03 DIAGNOSIS — R0602 Shortness of breath: Secondary | ICD-10-CM | POA: Insufficient documentation

## 2014-11-03 MED ORDER — ALBUTEROL SULFATE (2.5 MG/3ML) 0.083% IN NEBU
2.5000 mg | INHALATION_SOLUTION | Freq: Once | RESPIRATORY_TRACT | Status: AC
  Administered 2014-11-03: 2.5 mg via RESPIRATORY_TRACT

## 2014-11-11 LAB — PULMONARY FUNCTION TEST
DL/VA % pred: 154 %
DL/VA: 7.13 ml/min/mmHg/L
DLCO COR % PRED: 117 %
DLCO COR: 37.85 ml/min/mmHg
DLCO UNC % PRED: 117 %
DLCO UNC: 37.85 ml/min/mmHg
FEF 25-75 POST: 5.57 L/s
FEF 25-75 PRE: 4.13 L/s
FEF2575-%Change-Post: 34 %
FEF2575-%PRED-PRE: 126 %
FEF2575-%Pred-Post: 170 %
FEV1-%Change-Post: 6 %
FEV1-%PRED-POST: 89 %
FEV1-%PRED-PRE: 84 %
FEV1-POST: 3.39 L
FEV1-PRE: 3.19 L
FEV1FVC-%Change-Post: 2 %
FEV1FVC-%Pred-Pre: 112 %
FEV6-%CHANGE-POST: 3 %
FEV6-%Pred-Post: 80 %
FEV6-%Pred-Pre: 77 %
FEV6-Post: 3.83 L
FEV6-Pre: 3.69 L
FEV6FVC-%Pred-Post: 104 %
FEV6FVC-%Pred-Pre: 104 %
FVC-%CHANGE-POST: 3 %
FVC-%PRED-POST: 77 %
FVC-%Pred-Pre: 74 %
FVC-Post: 3.83 L
FVC-Pre: 3.69 L
POST FEV6/FVC RATIO: 100 %
Post FEV1/FVC ratio: 89 %
Pre FEV1/FVC ratio: 86 %
Pre FEV6/FVC Ratio: 100 %
RV % pred: 93 %
RV: 2 L
TLC % pred: 94 %
TLC: 6.57 L

## 2014-11-20 ENCOUNTER — Ambulatory Visit: Payer: BLUE CROSS/BLUE SHIELD | Attending: Pulmonary Disease | Admitting: Sleep Medicine

## 2014-11-20 DIAGNOSIS — R0683 Snoring: Secondary | ICD-10-CM | POA: Diagnosis present

## 2014-11-20 DIAGNOSIS — G4730 Sleep apnea, unspecified: Secondary | ICD-10-CM

## 2014-11-20 DIAGNOSIS — G4733 Obstructive sleep apnea (adult) (pediatric): Secondary | ICD-10-CM | POA: Insufficient documentation

## 2014-11-26 NOTE — Sleep Study (Signed)
  [place] A. Merlene Laughter, MD     [email or web address]        NOCTURNAL POLYSOMNOGRAM    LOCATION: SLEEP LAB FACILITY: [place]   PHYSICIAN: Nickey Kloepfer A. Merlene Laughter, M.D.   DATE OF STUDY: 11/20/2014.   REFERRING PHYSICIAN: [name].   INDICATIONS: The patient is a 54 year old who presents with restless sleep, snoring and excessive daytime sleepiness.  MEDICATIONS:  Prior to Admission medications   Medication Sig Start Date End Date Taking? Authorizing Provider  acetaminophen (TYLENOL) 500 MG tablet Take 1,000 mg by mouth every 6 (six) hours as needed for mild pain or moderate pain.    Historical Provider, MD  aspirin EC 81 MG tablet Take 81 mg by mouth once.    Historical Provider, MD  esomeprazole (NEXIUM) 40 MG capsule Take 40 mg by mouth daily before breakfast.     Historical Provider, MD  Misc Natural Products ([place]) Take 1 tablet by mouth daily.    Historical Provider, MD  Multiple Vitamin (MULTIVITAMIN WITH MINERALS) TABS tablet Take 1 tablet by mouth daily.    Historical Provider, MD  olmesartan (BENICAR) 40 MG tablet Take 40 mg by mouth daily.    Historical Provider, MD  oxyCODONE-acetaminophen (PERCOCET/ROXICET) 5-325 MG per tablet Take one or two tabs po q 4 hrs prn pain Patient not taking: Reported on 10/23/2014 05/23/12   Tammy L. Triplett, PA-C  rosuvastatin (CRESTOR) 10 MG tablet Take 10 mg by mouth daily.     Historical Provider, MD  Tamsulosin HCl (FLOMAX) 0.4 MG CAPS Take 1 capsule (0.4 mg total) by mouth daily. Patient not taking: Reported on 10/23/2014 05/23/12   Tammy L. Triplett, PA-C      EPWORTH SLEEPINESS SCALE: 9.   BMI: 33.   ARCHITECTURAL SUMMARY: Total recording time was 426 minutes. Sleep efficiency 82 %. Sleep latency 45 minutes. REM latency 161 minutes. Stage NI 1 %, N2 58 % and N3 22 % and REM sleep 19 %.    RESPIRATORY DATA:  Baseline oxygen saturation is 94 %. The lowest saturation is 86 %. The  diagnostic AHI is 11. The RDI is 15. The REM AHI is 21. The supine AHI is 38.  LIMB MOVEMENT SUMMARY: PLM index 0.   ELECTROCARDIOGRAM SUMMARY: Average heart rate is 69 with no significant dysrhythmias observed.   IMPRESSION:  1. Mild to moderate obstructive sleep apnea syndrome worse in the supine position. Consider formal CPAP titration recording.  Thanks for this referral.  Aubriauna Riner A. Merlene Laughter, M.D. Diplomat, Tax adviser of Sleep Medicine.

## 2014-12-06 ENCOUNTER — Ambulatory Visit (INDEPENDENT_AMBULATORY_CARE_PROVIDER_SITE_OTHER): Payer: BLUE CROSS/BLUE SHIELD | Admitting: Urology

## 2014-12-06 ENCOUNTER — Other Ambulatory Visit: Payer: Self-pay | Admitting: Urology

## 2014-12-06 DIAGNOSIS — R972 Elevated prostate specific antigen [PSA]: Secondary | ICD-10-CM

## 2014-12-06 DIAGNOSIS — N200 Calculus of kidney: Secondary | ICD-10-CM

## 2014-12-06 DIAGNOSIS — N281 Cyst of kidney, acquired: Secondary | ICD-10-CM

## 2014-12-13 ENCOUNTER — Other Ambulatory Visit: Payer: Self-pay | Admitting: Urology

## 2014-12-13 DIAGNOSIS — R972 Elevated prostate specific antigen [PSA]: Secondary | ICD-10-CM

## 2014-12-20 ENCOUNTER — Encounter (HOSPITAL_COMMUNITY): Payer: Self-pay

## 2014-12-20 ENCOUNTER — Ambulatory Visit (HOSPITAL_COMMUNITY)
Admission: RE | Admit: 2014-12-20 | Discharge: 2014-12-20 | Disposition: A | Discharge status: Home / Self Care | Payer: BLUE CROSS/BLUE SHIELD | Source: Ambulatory Visit | Attending: Urology | Admitting: Urology

## 2014-12-20 DIAGNOSIS — R972 Elevated prostate specific antigen [PSA]: Secondary | ICD-10-CM | POA: Diagnosis present

## 2014-12-20 DIAGNOSIS — C61 Malignant neoplasm of prostate: Secondary | ICD-10-CM | POA: Insufficient documentation

## 2014-12-20 MED ORDER — LIDOCAINE HCL (PF) 2 % IJ SOLN
INTRAMUSCULAR | Status: AC
  Administered 2014-12-20: 10 mL
  Filled 2014-12-20: qty 10

## 2014-12-20 MED ORDER — GENTAMICIN SULFATE 40 MG/ML IJ SOLN
INTRAMUSCULAR | Status: AC
  Filled 2014-12-20: qty 4

## 2014-12-20 MED ORDER — GENTAMICIN SULFATE 40 MG/ML IJ SOLN
160.0000 mg | Freq: Once | INTRAMUSCULAR | Status: AC
  Administered 2014-12-20: 160 mg via INTRAMUSCULAR

## 2014-12-20 MED ORDER — LIDOCAINE HCL (PF) 2 % IJ SOLN
10.0000 mL | Freq: Once | INTRAMUSCULAR | Status: AC
  Administered 2014-12-20: 10 mL

## 2014-12-20 NOTE — Discharge Instructions (Signed)
Transrectal Ultrasound-Guided Biopsy °A transrectal ultrasound-guided biopsy is a procedure to remove samples of tissue from your prostate using ultrasound images to guide the procedure. The procedure is usually done to evaluate the prostate gland of men who have an elevated prostate-specific antigen (PSA). PSA is a blood test to screen for prostate cancer. The biopsy samples are taken to check for prostate cancer.  °LET YOUR HEALTH CARE PROVIDER KNOW ABOUT: °· Any allergies you have. °· All medicines you are taking, including vitamins, herbs, eye drops, creams, and over-the-counter medicines. °· Previous problems you or members of your family have had with the use of anesthetics. °· Any blood disorders you have. °· Previous surgeries you have had. °· Medical conditions you have. °RISKS AND COMPLICATIONS °Generally, this is a safe procedure. However, as with any procedure, problems can occur. Possible problems include: °· Infection of your prostate. °· Bleeding from your rectum or blood in your urine. °· Difficulty urinating. °· Nerve damage (this is usually temporary). °· Damage to surrounding structures such as blood vessels, organs, and muscles, which would require other procedures. °BEFORE THE PROCEDURE °· Do not eat or drink anything after midnight on the night before the procedure or as directed by your health care provider. °· Take medicines only as directed by your health care provider. °· Your health care provider may have you stop taking certain medicines 5-7 days before the procedure. °· You will be given an enema before the procedure. During an enema, a liquid is injected into your rectum to clear out waste. °· You may have lab tests the day of your procedure.   °· Plan to have someone take you home after the procedure. °PROCEDURE  °· You will be given medicine to help you relax (sedative) before the procedure. An IV tube will be inserted into one of your veins and used to give fluids and  medicine. °· You will be given antibiotic medicine to reduce the risk of an infection. °· You will be placed on your side for the procedure. °· A probe with lubricated gel will be placed into your rectum, and images will be taken of your prostate and surrounding structures. °· Numbing medicine will be injected into the prostate before the biopsy samples are taken. °· A biopsy needle will then be inserted and guided to your prostate with the use of the ultrasound images. °· Samples of prostate tissue will be taken, and the needle will then be removed. °· The biopsy samples will be sent to a lab to be analyzed. Results are usually back in 2-3 days. °AFTER THE PROCEDURE °· You will be taken to a recovery area where you will be monitored. °· You may have some discomfort in the rectal area. You will be given pain medicines to control this. °· You may be allowed to go home the same day, or you may need to stay in the hospital overnight. °Document Released: 02/28/2014 Document Reviewed: 06/02/2013 °ExitCare® Patient Information ©2015 [employer]. This information is not intended to replace advice given to you by your health care provider. Make sure you discuss any questions you have with your health care provider. ° °

## 2014-12-30 ENCOUNTER — Other Ambulatory Visit (HOSPITAL_COMMUNITY): Payer: Self-pay | Admitting: Radiology

## 2014-12-30 DIAGNOSIS — G4730 Sleep apnea, unspecified: Secondary | ICD-10-CM

## 2015-01-06 ENCOUNTER — Ambulatory Visit: Payer: BLUE CROSS/BLUE SHIELD | Admitting: Urology

## 2015-01-08 ENCOUNTER — Ambulatory Visit: Payer: BLUE CROSS/BLUE SHIELD | Attending: Pulmonary Disease | Admitting: Sleep Medicine

## 2015-01-08 DIAGNOSIS — G4733 Obstructive sleep apnea (adult) (pediatric): Secondary | ICD-10-CM | POA: Diagnosis not present

## 2015-01-08 DIAGNOSIS — G4730 Sleep apnea, unspecified: Secondary | ICD-10-CM

## 2015-01-11 NOTE — Sleep Study (Signed)
  [place] A. Merlene Laughter, MD     [email or web address]        NOCTURNAL POLYSOMNOGRAM    LOCATION: SLEEP LAB FACILITY: [facility]   PHYSICIAN: Talajah Slimp A. Merlene Laughter, M.D.   DATE OF STUDY: 01/07/2014.   REFERRING PHYSICIAN: [name].   INDICATIONS: The patient is a 54 year old who has a history of obstructive sleep apnea syndrome documented by previous diagnostic polysomnography.  MEDICATIONS:  Prior to Admission medications   Medication Sig Start Date End Date Taking? Authorizing Provider  acetaminophen (TYLENOL) 500 MG tablet Take 1,000 mg by mouth every 6 (six) hours as needed for mild pain or moderate pain.    Historical Provider, MD  aspirin EC 81 MG tablet Take 81 mg by mouth once.    Historical Provider, MD  esomeprazole (NEXIUM) 40 MG capsule Take 40 mg by mouth daily before breakfast.     Historical Provider, MD  Misc Natural Products (Arabi) Take 1 tablet by mouth daily.    Historical Provider, MD  Multiple Vitamin (MULTIVITAMIN WITH MINERALS) TABS tablet Take 1 tablet by mouth daily.    Historical Provider, MD  olmesartan (BENICAR) 40 MG tablet Take 40 mg by mouth daily.    Historical Provider, MD  oxyCODONE-acetaminophen (PERCOCET/ROXICET) 5-325 MG per tablet Take one or two tabs po q 4 hrs prn pain Patient not taking: Reported on 10/23/2014 05/23/12   Tammi Triplett, PA-C  rosuvastatin (CRESTOR) 10 MG tablet Take 10 mg by mouth daily.     Historical Provider, MD  Tamsulosin HCl (FLOMAX) 0.4 MG CAPS Take 1 capsule (0.4 mg total) by mouth daily. Patient not taking: Reported on 10/23/2014 05/23/12   Tammi Triplett, PA-C      EPWORTH SLEEPINESS SCALE: 10.   BMI: 33.   ARCHITECTURAL SUMMARY: Total recording time was 400 minutes. Sleep efficiency 89 %. Sleep latency 19 minutes. REM latency 116 minutes. Stage NI 2 %, N2 48 % and N3 33 % and REM sleep 17 %.    RESPIRATORY DATA:  Baseline oxygen saturation is 94 %. The patient was  placed on positive pressure starting at 5 and increased to 9. The optimal pressure is 9 with resolution of obstructive events and good tolerance.  LIMB MOVEMENT SUMMARY: PLM index 0.   ELECTROCARDIOGRAM SUMMARY: Average heart rate is 66 with no significant dysrhythmias observed.   IMPRESSION:  1. Obstructive sleep apnea syndrome which responds well to his CPAP of 9.  Thanks for this referral.  Tiffanni Scarfo A. Merlene Laughter, M.D. Diplomat, Tax adviser of Sleep Medicine.

## 2015-05-09 ENCOUNTER — Other Ambulatory Visit: Payer: Self-pay | Admitting: Urology

## 2015-05-09 ENCOUNTER — Ambulatory Visit (INDEPENDENT_AMBULATORY_CARE_PROVIDER_SITE_OTHER): Payer: BLUE CROSS/BLUE SHIELD | Admitting: Urology

## 2015-05-09 DIAGNOSIS — C61 Malignant neoplasm of prostate: Secondary | ICD-10-CM

## 2015-06-13 ENCOUNTER — Other Ambulatory Visit: Payer: Self-pay | Admitting: Urology

## 2015-06-13 DIAGNOSIS — C61 Malignant neoplasm of prostate: Secondary | ICD-10-CM

## 2015-07-26 ENCOUNTER — Ambulatory Visit (HOSPITAL_COMMUNITY)
Admission: RE | Admit: 2015-07-26 | Discharge: 2015-07-26 | Disposition: A | Discharge status: Home / Self Care | Payer: BLUE CROSS/BLUE SHIELD | Source: Ambulatory Visit | Attending: Urology | Admitting: Urology

## 2015-07-26 DIAGNOSIS — C61 Malignant neoplasm of prostate: Secondary | ICD-10-CM | POA: Diagnosis not present

## 2015-07-26 LAB — POCT I-STAT CREATININE: Creatinine, Ser: 1.3 mg/dL — ABNORMAL HIGH (ref 0.61–1.24)

## 2015-07-26 MED ORDER — GADOBENATE DIMEGLUMINE 529 MG/ML IV SOLN
20.0000 mL | Freq: Once | INTRAVENOUS | Status: AC | PRN
  Administered 2015-07-26: 20 mL via INTRAVENOUS

## 2015-08-08 ENCOUNTER — Ambulatory Visit (HOSPITAL_COMMUNITY)
Admission: RE | Admit: 2015-08-08 | Discharge: 2015-08-08 | Disposition: A | Discharge status: Home / Self Care | Payer: BLUE CROSS/BLUE SHIELD | Source: Ambulatory Visit | Attending: Urology | Admitting: Urology

## 2015-08-08 ENCOUNTER — Other Ambulatory Visit: Payer: Self-pay | Admitting: Urology

## 2015-08-08 DIAGNOSIS — C61 Malignant neoplasm of prostate: Secondary | ICD-10-CM | POA: Diagnosis present

## 2015-08-08 MED ORDER — LIDOCAINE HCL (PF) 2 % IJ SOLN
INTRAMUSCULAR | Status: AC
  Administered 2015-08-08: 13:00:00
  Filled 2015-08-08: qty 10

## 2015-08-08 MED ORDER — GENTAMICIN SULFATE 40 MG/ML IJ SOLN
160.0000 mg | Freq: Once | INTRAMUSCULAR | Status: AC
  Administered 2015-08-08: 160 mg via INTRAMUSCULAR

## 2015-08-08 MED ORDER — GENTAMICIN SULFATE 40 MG/ML IJ SOLN
INTRAMUSCULAR | Status: AC
  Administered 2015-08-08: 160 mg via INTRAMUSCULAR
  Filled 2015-08-08: qty 4

## 2015-11-20 ENCOUNTER — Encounter: Payer: Self-pay | Admitting: Internal Medicine

## 2016-02-01 DIAGNOSIS — C61 Malignant neoplasm of prostate: Secondary | ICD-10-CM | POA: Diagnosis not present

## 2016-02-13 ENCOUNTER — Ambulatory Visit (INDEPENDENT_AMBULATORY_CARE_PROVIDER_SITE_OTHER): Payer: BLUE CROSS/BLUE SHIELD | Admitting: Urology

## 2016-02-13 DIAGNOSIS — C61 Malignant neoplasm of prostate: Secondary | ICD-10-CM | POA: Diagnosis not present

## 2016-02-13 DIAGNOSIS — N200 Calculus of kidney: Secondary | ICD-10-CM | POA: Diagnosis not present

## 2016-02-13 DIAGNOSIS — R3915 Urgency of urination: Secondary | ICD-10-CM | POA: Diagnosis not present

## 2016-04-22 DIAGNOSIS — G4733 Obstructive sleep apnea (adult) (pediatric): Secondary | ICD-10-CM | POA: Diagnosis not present

## 2016-08-20 ENCOUNTER — Ambulatory Visit (INDEPENDENT_AMBULATORY_CARE_PROVIDER_SITE_OTHER): Payer: BLUE CROSS/BLUE SHIELD | Admitting: Urology

## 2016-08-20 DIAGNOSIS — C61 Malignant neoplasm of prostate: Secondary | ICD-10-CM | POA: Diagnosis not present

## 2016-08-20 DIAGNOSIS — N5201 Erectile dysfunction due to arterial insufficiency: Secondary | ICD-10-CM

## 2016-08-31 ENCOUNTER — Emergency Department (HOSPITAL_BASED_OUTPATIENT_CLINIC_OR_DEPARTMENT_OTHER): Payer: BLUE CROSS/BLUE SHIELD

## 2016-08-31 ENCOUNTER — Emergency Department (HOSPITAL_BASED_OUTPATIENT_CLINIC_OR_DEPARTMENT_OTHER)
Admission: EM | Admit: 2016-08-31 | Discharge: 2016-08-31 | Disposition: A | Discharge status: Home / Self Care | Payer: BLUE CROSS/BLUE SHIELD | Attending: Emergency Medicine | Admitting: Emergency Medicine

## 2016-08-31 ENCOUNTER — Encounter (HOSPITAL_BASED_OUTPATIENT_CLINIC_OR_DEPARTMENT_OTHER): Payer: Self-pay | Admitting: Emergency Medicine

## 2016-08-31 DIAGNOSIS — Z79899 Other long term (current) drug therapy: Secondary | ICD-10-CM | POA: Insufficient documentation

## 2016-08-31 DIAGNOSIS — R1110 Vomiting, unspecified: Secondary | ICD-10-CM | POA: Insufficient documentation

## 2016-08-31 DIAGNOSIS — Z87891 Personal history of nicotine dependence: Secondary | ICD-10-CM | POA: Insufficient documentation

## 2016-08-31 DIAGNOSIS — J069 Acute upper respiratory infection, unspecified: Secondary | ICD-10-CM | POA: Diagnosis not present

## 2016-08-31 DIAGNOSIS — I10 Essential (primary) hypertension: Secondary | ICD-10-CM | POA: Diagnosis not present

## 2016-08-31 DIAGNOSIS — Z7982 Long term (current) use of aspirin: Secondary | ICD-10-CM | POA: Insufficient documentation

## 2016-08-31 DIAGNOSIS — B9789 Other viral agents as the cause of diseases classified elsewhere: Secondary | ICD-10-CM

## 2016-08-31 DIAGNOSIS — R05 Cough: Secondary | ICD-10-CM | POA: Diagnosis not present

## 2016-08-31 MED ORDER — IPRATROPIUM BROMIDE 0.06 % NA SOLN: 2.0000 | Freq: Four times a day (QID) | NASAL | 12 refills | Status: DC

## 2016-08-31 MED ORDER — BENZONATATE 100 MG PO CAPS: 100.0000 mg | ORAL_CAPSULE | Freq: Three times a day (TID) | ORAL | 0 refills | Status: DC | PRN

## 2016-08-31 MED ORDER — ALBUTEROL SULFATE HFA 108 (90 BASE) MCG/ACT IN AERS
2.0000 | INHALATION_SPRAY | Freq: Once | RESPIRATORY_TRACT | Status: AC
  Administered 2016-08-31: 2 via RESPIRATORY_TRACT
  Filled 2016-08-31: qty 6.7

## 2016-08-31 NOTE — ED Provider Notes (Signed)
[facility] DEPT [facility] Provider Note   CSN: [id number] Arrival date & time: 08/31/16  2021  By signing my name below, I, Arianna Nassar, attest that this documentation has been prepared under the direction and in the presence of Gareth Morgan, MD.  Electronically Signed: Julien Nordmann, ED Scribe. 08/31/16. 10:09 PM.    History   Chief Complaint Chief Complaint  Patient presents with  . Cough    The history is provided by the patient. No language interpreter was used.   HPI Comments: Anthony Anderson is a 55 y.o. male who has a PMHx of GERD, HLD, prostate cancer and HTN presents to the Emergency Department complaining of gradual worsening, moderate productive cough that brings up yellow/brown sputum x 2 weeks. Associated post nasal drip, nasal congestion, ear pressure, mild headache, diaphoresis this morning, light-headedness secondary to cough, sore throat, chest pain secondary to cough and post-tussive emesis x 2. Pt says his cough is worse in the morning. He has taken multiple OTC medication and home remedies to alleviate his symptoms without any relief. Denies shortness of breath, abdominal pain, diarrhea, constipation, dysuria, hematuria, loss of appetite, melena, blood in stool, or hx of CAD. Pt is a former smoker ~ 30-40 years ago.  Past Medical History:  Diagnosis Date  . Cholelithiasis   . GERD (gastroesophageal reflux disease)   . Hyperlipidemia   . Hypertension   . Kidney stones   . PSA elevation     Patient Active Problem List   Diagnosis Date Noted  . Essential hypertension 10/26/2014  . Hyperlipidemia 10/26/2014  . GERD (gastroesophageal reflux disease) 10/26/2014  . Chest pain   . Shortness of breath 10/24/2014  . Near syncope 10/24/2014  . Dyspnea 10/24/2014    Past Surgical History:  Procedure Laterality Date  . LEFT HEART CATHETERIZATION WITH CORONARY ANGIOGRAM N/A 10/26/2014   Procedure: LEFT HEART CATHETERIZATION WITH CORONARY ANGIOGRAM;  Surgeon:  Sinclair Grooms, MD;  Location: [facility] CATH LAB;  Service: Cardiovascular;  Laterality: N/A;  . LITHOTRIPSY    . NASAL SINUS SURGERY         Home Medications    Prior to Admission medications   Medication Sig Start Date End Date Taking? Authorizing Provider  acetaminophen (TYLENOL) 500 MG tablet Take 1,000 mg by mouth every 6 (six) hours as needed for mild pain or moderate pain.    Historical Provider, MD  aspirin EC 81 MG tablet Take 81 mg by mouth once.    Historical Provider, MD  benzonatate (TESSALON) 100 MG capsule Take 1 capsule (100 mg total) by mouth 3 (three) times daily as needed for cough. 08/31/16   Gareth Morgan, MD  esomeprazole (NEXIUM) 40 MG capsule Take 40 mg by mouth daily before breakfast.     Historical Provider, MD  ipratropium (ATROVENT) 0.06 % nasal spray Place 2 sprays into both nostrils 4 (four) times daily. 08/31/16   Gareth Morgan, MD  Misc Natural Products (MENS PROSTATE HEALTH FORMULA PO) Take 1 tablet by mouth daily.    Historical Provider, MD  Multiple Vitamin (MULTIVITAMIN WITH MINERALS) TABS tablet Take 1 tablet by mouth daily.    Historical Provider, MD  olmesartan (BENICAR) 40 MG tablet Take 40 mg by mouth daily.    Historical Provider, MD  oxyCODONE-acetaminophen (PERCOCET/ROXICET) 5-325 MG per tablet Take one or two tabs po q 4 hrs prn pain Patient not taking: Reported on 10/23/2014 05/23/12   Tammy Triplett, PA-C  rosuvastatin (CRESTOR) 10 MG tablet Take 10 mg by  mouth daily.     Historical Provider, MD  Tamsulosin HCl (FLOMAX) 0.4 MG CAPS Take 1 capsule (0.4 mg total) by mouth daily. Patient not taking: Reported on 10/23/2014 05/23/12   Kem Parkinson, PA-C    Family History Family History  Problem Relation Age of Onset  . CAD Mother   . CAD Father   . Prostate cancer Father     Social History Social History  Substance Use Topics  . Smoking status: Former Smoker    Packs/day: 1.00    Years: 10.00    Types: Cigarettes    Quit date:  09/20/1982  . Smokeless tobacco: Never Used  . Alcohol use No     Allergies   Patient has no known allergies.   Review of Systems Review of Systems  Constitutional: Positive for diaphoresis. Negative for appetite change, chills and fever.  HENT: Positive for congestion, postnasal drip, rhinorrhea, sinus pressure and sore throat. Negative for ear pain.   Eyes: Negative for visual disturbance.  Respiratory: Positive for cough. Negative for shortness of breath.   Cardiovascular: Negative for chest pain.  Gastrointestinal: Positive for vomiting. Negative for abdominal pain, blood in stool, constipation, diarrhea and nausea.  Genitourinary: Negative for difficulty urinating, dysuria and hematuria.  Musculoskeletal: Negative for back pain and neck stiffness.  Skin: Negative for rash.  Neurological: Positive for light-headedness. Negative for syncope and headaches (mild).     Physical Exam Updated Vital Signs BP 132/94 (BP Location: Right Arm)   Pulse 76   Temp 97.9 F (36.6 C) (Oral)   Resp 16   Ht 5\' 10"  (1.778 m)   Wt 240 lb (108.9 kg)   SpO2 95%   BMI 34.44 kg/m   Physical Exam  Constitutional: He is oriented to person, place, and time. He appears well-developed and well-nourished.  HENT:  Head: Normocephalic and atraumatic.  Mouth/Throat: Oropharynx is clear and moist. No oropharyngeal exudate.  No facial tenderness Normal TM  Eyes: EOM are normal. Pupils are equal, round, and reactive to light.  Neck: Normal range of motion.  Cardiovascular: Normal rate, regular rhythm, normal heart sounds and intact distal pulses.   Pulmonary/Chest: Effort normal and breath sounds normal. No respiratory distress. He has no wheezes. He has no rales.  Bronchospastic cough  Abdominal: Soft. He exhibits no distension. There is no tenderness.  Musculoskeletal: Normal range of motion.  Neurological: He is alert and oriented to person, place, and time.  Skin: Skin is warm and dry.    Psychiatric: He has a normal mood and affect. Judgment normal.  Nursing note and vitals reviewed.    ED Treatments / Results  DIAGNOSTIC STUDIES: Oxygen Saturation is 94% on RA, low by my interpretation.  COORDINATION OF CARE:  10:03 PM Discussed treatment plan with pt at bedside and pt agreed to plan.   Labs (all labs ordered are listed, but only abnormal results are displayed) Labs Reviewed - No data to display  EKG  EKG Interpretation None       Radiology Dg Chest 2 View  Result Date: 08/31/2016 CLINICAL DATA:  Cough and congestion for 2 weeks EXAM: CHEST  2 VIEW COMPARISON:  10/23/2014 FINDINGS: Large hiatal hernia. The lungs are clear. The pulmonary vasculature is normal. There are no pleural effusions. Borderline cardiomegaly, unchanged. Hilar and mediastinal contours are normal and unchanged. IMPRESSION: Stable borderline cardiomegaly.  Hiatal hernia.  No acute findings. Electronically Signed   By: Andreas Newport M.D.   On: 08/31/2016 21:02  Procedures Procedures (including critical care time)  Medications Ordered in ED Medications  albuterol (PROVENTIL HFA;VENTOLIN HFA) 108 (90 Base) MCG/ACT inhaler 2 puff (2 puffs Inhalation Given 08/31/16 2222)     Initial Impression / Assessment and Plan / ED Course  I have reviewed the triage vital signs and the nursing notes.  Pertinent labs & imaging results that were available during my care of the patient were reviewed by me and considered in my medical decision making (see chart for details).  Clinical Course    55yo male with hx of htn,hlpd, presents with concern for cough, nasal congestion. CXR shows no signs of pneumonia. Borderline cardiomegaly is stable. No facial pain, no fevers, doubt acute bacterial sinusitis. No sign of otitis media.  Suspect continuing viral syndrome.  Recommend continued supportive care, return for fevers, dyspnea, worsening. Gave albuterol prn cough, tessalon perles and atrovent nasal  spray rx. Patient discharged in stable condition with understanding of reasons to return.   Final Clinical Impressions(s) / ED Diagnoses   Final diagnoses:  Viral URI with cough   I personally performed the services described in this documentation, which was scribed in my presence. The recorded information has been reviewed and is accurate.  New Prescriptions Discharge Medication List as of 08/31/2016 10:12 PM    START taking these medications   Details  benzonatate (TESSALON) 100 MG capsule Take 1 capsule (100 mg total) by mouth 3 (three) times daily as needed for cough., Starting Sat 08/31/2016, Print    ipratropium (ATROVENT) 0.06 % nasal spray Place 2 sprays into both nostrils 4 (four) times daily., Starting Sat 08/31/2016, Print         Gareth Morgan, MD 09/01/16 [phone]

## 2016-08-31 NOTE — ED Notes (Signed)
Pt c/o coughing with yellowish, brown sputum for the past two weeks; pt denies nausea; pt states vomiting x2 today due to severe cough; pt states trying multiple OTC and home remedies with no results; pt denies sob, lightheadedness or dizziness; no peripheral edema noted, lung sounds clear bilaterally in all fields

## 2016-08-31 NOTE — ED Notes (Signed)
Pt is in good condition; discharge instructions reviewed, prescription medication reviewed; home care and follow up care reviewed; patient verbalized understanding; pt is ambulatory and leaving ED by himself.

## 2016-08-31 NOTE — ED Triage Notes (Signed)
Pt in c/o cough, congestion, and generally not feeling well x 2 weeks. Pt alert, interactive, ambulatory in NAD.

## 2016-12-09 DIAGNOSIS — J189 Pneumonia, unspecified organism: Secondary | ICD-10-CM | POA: Diagnosis not present

## 2017-02-07 DIAGNOSIS — C61 Malignant neoplasm of prostate: Secondary | ICD-10-CM | POA: Diagnosis not present

## 2017-02-11 DIAGNOSIS — R0602 Shortness of breath: Secondary | ICD-10-CM | POA: Diagnosis not present

## 2017-02-11 DIAGNOSIS — E785 Hyperlipidemia, unspecified: Secondary | ICD-10-CM | POA: Diagnosis not present

## 2017-02-11 DIAGNOSIS — Z0000 Encounter for general adult medical examination without abnormal findings: Secondary | ICD-10-CM | POA: Diagnosis not present

## 2017-02-11 DIAGNOSIS — I10 Essential (primary) hypertension: Secondary | ICD-10-CM | POA: Diagnosis not present

## 2017-02-14 DIAGNOSIS — G4733 Obstructive sleep apnea (adult) (pediatric): Secondary | ICD-10-CM | POA: Diagnosis not present

## 2017-02-18 ENCOUNTER — Ambulatory Visit (INDEPENDENT_AMBULATORY_CARE_PROVIDER_SITE_OTHER): Payer: BLUE CROSS/BLUE SHIELD | Admitting: Urology

## 2017-02-18 DIAGNOSIS — N5201 Erectile dysfunction due to arterial insufficiency: Secondary | ICD-10-CM

## 2017-02-18 DIAGNOSIS — C61 Malignant neoplasm of prostate: Secondary | ICD-10-CM

## 2017-04-23 DIAGNOSIS — L03115 Cellulitis of right lower limb: Secondary | ICD-10-CM | POA: Diagnosis not present

## 2017-08-18 DIAGNOSIS — C61 Malignant neoplasm of prostate: Secondary | ICD-10-CM | POA: Diagnosis not present

## 2017-08-26 ENCOUNTER — Ambulatory Visit (INDEPENDENT_AMBULATORY_CARE_PROVIDER_SITE_OTHER): Payer: BLUE CROSS/BLUE SHIELD | Admitting: Urology

## 2017-08-26 DIAGNOSIS — N5201 Erectile dysfunction due to arterial insufficiency: Secondary | ICD-10-CM

## 2017-08-26 DIAGNOSIS — C61 Malignant neoplasm of prostate: Secondary | ICD-10-CM

## 2017-10-30 DIAGNOSIS — R222 Localized swelling, mass and lump, trunk: Secondary | ICD-10-CM | POA: Diagnosis not present

## 2017-10-30 DIAGNOSIS — E785 Hyperlipidemia, unspecified: Secondary | ICD-10-CM | POA: Diagnosis not present

## 2017-10-30 DIAGNOSIS — I10 Essential (primary) hypertension: Secondary | ICD-10-CM | POA: Diagnosis not present

## 2017-11-05 ENCOUNTER — Other Ambulatory Visit (HOSPITAL_COMMUNITY): Payer: Self-pay | Admitting: Pulmonary Disease

## 2017-11-05 DIAGNOSIS — R222 Localized swelling, mass and lump, trunk: Secondary | ICD-10-CM

## 2017-11-06 ENCOUNTER — Ambulatory Visit (HOSPITAL_COMMUNITY): Payer: BLUE CROSS/BLUE SHIELD

## 2017-11-17 ENCOUNTER — Telehealth: Payer: Self-pay | Admitting: Pulmonary Disease

## 2017-11-18 ENCOUNTER — Ambulatory Visit (HOSPITAL_COMMUNITY): Payer: BLUE CROSS/BLUE SHIELD

## 2017-11-18 ENCOUNTER — Encounter (HOSPITAL_COMMUNITY): Payer: Self-pay

## 2017-11-21 ENCOUNTER — Ambulatory Visit (HOSPITAL_COMMUNITY)
Admission: RE | Admit: 2017-11-21 | Discharge: 2017-11-21 | Disposition: A | Discharge status: Home / Self Care | Payer: BLUE CROSS/BLUE SHIELD | Source: Ambulatory Visit | Attending: Pulmonary Disease | Admitting: Pulmonary Disease

## 2017-11-21 DIAGNOSIS — K449 Diaphragmatic hernia without obstruction or gangrene: Secondary | ICD-10-CM | POA: Insufficient documentation

## 2017-11-21 DIAGNOSIS — R222 Localized swelling, mass and lump, trunk: Secondary | ICD-10-CM | POA: Diagnosis not present

## 2017-11-21 DIAGNOSIS — K760 Fatty (change of) liver, not elsewhere classified: Secondary | ICD-10-CM | POA: Diagnosis not present

## 2017-11-21 DIAGNOSIS — K8020 Calculus of gallbladder without cholecystitis without obstruction: Secondary | ICD-10-CM | POA: Insufficient documentation

## 2017-11-21 LAB — POCT I-STAT CREATININE: Creatinine, Ser: 1.2 mg/dL (ref 0.61–1.24)

## 2017-11-21 MED ORDER — IOPAMIDOL (ISOVUE-300) INJECTION 61%
75.0000 mL | Freq: Once | INTRAVENOUS | Status: AC | PRN
  Administered 2017-11-21: 75 mL via INTRAVENOUS

## 2017-11-27 ENCOUNTER — Other Ambulatory Visit (HOSPITAL_COMMUNITY): Payer: Self-pay | Admitting: Pulmonary Disease

## 2017-11-27 DIAGNOSIS — R222 Localized swelling, mass and lump, trunk: Secondary | ICD-10-CM

## 2017-12-02 ENCOUNTER — Ambulatory Visit (HOSPITAL_COMMUNITY)
Admission: RE | Admit: 2017-12-02 | Discharge: 2017-12-02 | Disposition: A | Discharge status: Home / Self Care | Payer: BLUE CROSS/BLUE SHIELD | Source: Ambulatory Visit | Attending: Pulmonary Disease | Admitting: Pulmonary Disease

## 2017-12-02 DIAGNOSIS — K449 Diaphragmatic hernia without obstruction or gangrene: Secondary | ICD-10-CM | POA: Diagnosis not present

## 2017-12-02 DIAGNOSIS — R222 Localized swelling, mass and lump, trunk: Secondary | ICD-10-CM | POA: Insufficient documentation

## 2017-12-02 MED ORDER — GADOBENATE DIMEGLUMINE 529 MG/ML IV SOLN
20.0000 mL | Freq: Once | INTRAVENOUS | Status: AC | PRN
  Administered 2017-12-02: 20 mL via INTRAVENOUS

## 2017-12-16 ENCOUNTER — Ambulatory Visit: Payer: BLUE CROSS/BLUE SHIELD | Admitting: General Surgery

## 2017-12-30 ENCOUNTER — Ambulatory Visit (INDEPENDENT_AMBULATORY_CARE_PROVIDER_SITE_OTHER): Payer: BLUE CROSS/BLUE SHIELD | Admitting: General Surgery

## 2017-12-30 ENCOUNTER — Encounter (INDEPENDENT_AMBULATORY_CARE_PROVIDER_SITE_OTHER): Payer: Self-pay

## 2017-12-30 ENCOUNTER — Encounter: Payer: Self-pay | Admitting: General Surgery

## 2017-12-30 VITALS — BP 143/93 | HR 68 | Temp 98.2°F | Ht 70.0 in | Wt 249.0 lb

## 2017-12-30 DIAGNOSIS — D171 Benign lipomatous neoplasm of skin and subcutaneous tissue of trunk: Secondary | ICD-10-CM | POA: Diagnosis not present

## 2017-12-30 NOTE — Patient Instructions (Signed)
Lipoma A lipoma is a noncancerous (benign) tumor that is made up of fat cells. This is a very common type of soft-tissue growth. Lipomas are usually found under the skin (subcutaneous). They may occur in any tissue of the body that contains fat. Common areas for lipomas to appear include the back, shoulders, buttocks, and thighs. Lipomas grow slowly, and they are usually painless. Most lipomas do not cause problems and do not require treatment. What are the causes? The cause of this condition is not known. What increases the risk? This condition is more likely to develop in:  People who are 40-60 years old.  People who have a family history of lipomas.  What are the signs or symptoms? A lipoma usually appears as a small, round bump under the skin. It may feel soft or rubbery, but the firmness can vary. Most lipomas are not painful. However, a lipoma may become painful if it is located in an area where it pushes on nerves. How is this diagnosed? A lipoma can usually be diagnosed with a physical exam. You may also have tests to confirm the diagnosis and to rule out other conditions. Tests may include:  Imaging tests, such as a CT scan or MRI.  Removal of a tissue sample to be looked at under a microscope (biopsy).  How is this treated? Treatment is not needed for small lipomas that are not causing problems. If a lipoma continues to get bigger or it causes problems, removal is often the best option. Lipomas can also be removed to improve appearance. Removal of a lipoma is usually done with a surgery in which the fatty cells and the surrounding capsule are removed. Most often, a medicine that numbs the area (local anesthetic) is used for this procedure. Follow these instructions at home:  Keep all follow-up visits as directed by your health care provider. This is important. Contact a health care provider if:  Your lipoma becomes larger or hard.  Your lipoma becomes painful, red, or  increasingly swollen. These could be signs of infection or a more serious condition. This information is not intended to replace advice given to you by your health care provider. Make sure you discuss any questions you have with your health care provider. Document Released: 10/04/2002 Document Revised: 03/21/2016 Document Reviewed: 10/10/2014 Elsevier Interactive Patient Education  2018 Elsevier Inc.  

## 2017-12-30 NOTE — Progress Notes (Signed)
Anthony Anderson; [id number]; 1960/12/01   HPI Patient is a 57 year old white male who was referred to my care by Dr. Luan Pulling for evaluation and treatment of a mass on the left upper chest wall.  Is been present for some time now, but is increasing in size and causing him discomfort.  Wearing a seatbelt with a shoulder strap causes discomfort.  His pain is 1 out of 10.  He has had extensive workup including MRI of the chest wall which reveals a probable lipoma. Past Medical History:  Diagnosis Date  . Cholelithiasis   . GERD (gastroesophageal reflux disease)   . Hyperlipidemia   . Hypertension   . Kidney stones   . PSA elevation     Past Surgical History:  Procedure Laterality Date  . LEFT HEART CATHETERIZATION WITH CORONARY ANGIOGRAM N/A 10/26/2014   Procedure: LEFT HEART CATHETERIZATION WITH CORONARY ANGIOGRAM;  Surgeon: Sinclair Grooms, MD;  Location: [facility] CATH LAB;  Service: Cardiovascular;  Laterality: N/A;  . LITHOTRIPSY    . NASAL SINUS SURGERY      Family History  Problem Relation Age of Onset  . CAD Mother   . CAD Father   . Prostate cancer Father     Current Outpatient Medications on File Prior to Visit  Medication Sig Dispense Refill  . acetaminophen (TYLENOL) 500 MG tablet Take 1,000 mg by mouth every 6 (six) hours as needed for mild pain or moderate pain.    Marland Kitchen aspirin EC 81 MG tablet Take 81 mg by mouth once.    . benzonatate (TESSALON) 100 MG capsule Take 1 capsule (100 mg total) by mouth 3 (three) times daily as needed for cough. 21 capsule 0  . esomeprazole (NEXIUM) 40 MG capsule Take 40 mg by mouth daily before breakfast.     . ipratropium (ATROVENT) 0.06 % nasal spray Place 2 sprays into both nostrils 4 (four) times daily. 15 mL 12  . Misc Natural Products (MENS PROSTATE HEALTH FORMULA PO) Take 1 tablet by mouth daily.    . Multiple Vitamin (MULTIVITAMIN WITH MINERALS) TABS tablet Take 1 tablet by mouth daily.    Marland Kitchen olmesartan (BENICAR) 40 MG tablet Take 40 mg by  mouth daily.    Marland Kitchen oxyCODONE-acetaminophen (PERCOCET/ROXICET) 5-325 MG per tablet Take one or two tabs po q 4 hrs prn pain (Patient not taking: Reported on 10/23/2014) 24 tablet 0  . rosuvastatin (CRESTOR) 10 MG tablet Take 10 mg by mouth daily.     . Tamsulosin HCl (FLOMAX) 0.4 MG CAPS Take 1 capsule (0.4 mg total) by mouth daily. (Patient not taking: Reported on 10/23/2014) 20 capsule 0   No current facility-administered medications on file prior to visit.     No Known Allergies  Social History   Substance and Sexual Activity  Alcohol Use No    Social History   Tobacco Use  Smoking Status Former Smoker  . Packs/day: 1.00  . Years: 10.00  . Pack years: 10.00  . Types: Cigarettes  . Last attempt to quit: 09/20/1982  . Years since quitting: 35.3  Smokeless Tobacco Never Used    Review of Systems  Constitutional: Negative.   HENT: Negative.   Eyes: Negative.   Respiratory: Negative.   Gastrointestinal: Positive for heartburn.  Genitourinary: Positive for frequency.  Musculoskeletal: Negative.   Skin: Negative.   Neurological: Negative.   Endo/Heme/Allergies: Negative.   Psychiatric/Behavioral: Negative.     Objective   Vitals:   12/30/17 1003  BP: (!) 143/93  Pulse: 68  Temp: 98.2 F (36.8 C)    Physical Exam  Constitutional: He is oriented to person, place, and time and well-developed, well-nourished, and in no distress.  HENT:  Head: Normocephalic and atraumatic.  Cardiovascular: Normal rate, regular rhythm and normal heart sounds. Exam reveals no gallop and no friction rub.  No murmur heard. Pulmonary/Chest: Effort normal and breath sounds normal. No respiratory distress. He has no wheezes. He has no rales.  4 cm ovoid rubbery subcutaneous mass noted in the left upper anterior chest wall.  It is mobile.  Slightly tender to touch.  Neurological: He is alert and oriented to person, place, and time.  Skin: Skin is warm and dry.  Vitals reviewed.  CT scan  and MRI reports reviewed Assessment  4 cm subcutaneous mass, left chest wall, probable lipoma Plan   Patient is scheduled for excision of the subcutaneous mass on the chest wall on 01/09/2018.  The risks and benefits of the procedure including bleeding, infection, and the possibility of malignancy were fully explained to the patient, who gave informed consent.

## 2017-12-30 NOTE — H&P (Signed)
Anthony Anderson; [id number]; February 15, 1961   HPI Patient is a 57 year old white male who was referred to my care by Dr. Luan Pulling for evaluation and treatment of a mass on the left upper chest wall.  Is been present for some time now, but is increasing in size and causing him discomfort.  Wearing a seatbelt with a shoulder strap causes discomfort.  His pain is 1 out of 10.  He has had extensive workup including MRI of the chest wall which reveals a probable lipoma. Past Medical History:  Diagnosis Date  . Cholelithiasis   . GERD (gastroesophageal reflux disease)   . Hyperlipidemia   . Hypertension   . Kidney stones   . PSA elevation     Past Surgical History:  Procedure Laterality Date  . LEFT HEART CATHETERIZATION WITH CORONARY ANGIOGRAM N/A 10/26/2014   Procedure: LEFT HEART CATHETERIZATION WITH CORONARY ANGIOGRAM;  Surgeon: Sinclair Grooms, MD;  Location: [facility] CATH LAB;  Service: Cardiovascular;  Laterality: N/A;  . LITHOTRIPSY    . NASAL SINUS SURGERY      Family History  Problem Relation Age of Onset  . CAD Mother   . CAD Father   . Prostate cancer Father     Current Outpatient Medications on File Prior to Visit  Medication Sig Dispense Refill  . acetaminophen (TYLENOL) 500 MG tablet Take 1,000 mg by mouth every 6 (six) hours as needed for mild pain or moderate pain.    Marland Kitchen aspirin EC 81 MG tablet Take 81 mg by mouth once.    . benzonatate (TESSALON) 100 MG capsule Take 1 capsule (100 mg total) by mouth 3 (three) times daily as needed for cough. 21 capsule 0  . esomeprazole (NEXIUM) 40 MG capsule Take 40 mg by mouth daily before breakfast.     . ipratropium (ATROVENT) 0.06 % nasal spray Place 2 sprays into both nostrils 4 (four) times daily. 15 mL 12  . Misc Natural Products (MENS PROSTATE HEALTH FORMULA PO) Take 1 tablet by mouth daily.    . Multiple Vitamin (MULTIVITAMIN WITH MINERALS) TABS tablet Take 1 tablet by mouth daily.    Marland Kitchen olmesartan (BENICAR) 40 MG tablet Take 40 mg by  mouth daily.    Marland Kitchen oxyCODONE-acetaminophen (PERCOCET/ROXICET) 5-325 MG per tablet Take one or two tabs po q 4 hrs prn pain (Patient not taking: Reported on 10/23/2014) 24 tablet 0  . rosuvastatin (CRESTOR) 10 MG tablet Take 10 mg by mouth daily.     . Tamsulosin HCl (FLOMAX) 0.4 MG CAPS Take 1 capsule (0.4 mg total) by mouth daily. (Patient not taking: Reported on 10/23/2014) 20 capsule 0   No current facility-administered medications on file prior to visit.     No Known Allergies  Social History   Substance and Sexual Activity  Alcohol Use No    Social History   Tobacco Use  Smoking Status Former Smoker  . Packs/day: 1.00  . Years: 10.00  . Pack years: 10.00  . Types: Cigarettes  . Last attempt to quit: 09/20/1982  . Years since quitting: 35.3  Smokeless Tobacco Never Used    Review of Systems  Constitutional: Negative.   HENT: Negative.   Eyes: Negative.   Respiratory: Negative.   Gastrointestinal: Positive for heartburn.  Genitourinary: Positive for frequency.  Musculoskeletal: Negative.   Skin: Negative.   Neurological: Negative.   Endo/Heme/Allergies: Negative.   Psychiatric/Behavioral: Negative.     Objective   Vitals:   12/30/17 1003  BP: (!) 143/93  Pulse: 68  Temp: 98.2 F (36.8 C)    Physical Exam  Constitutional: He is oriented to person, place, and time and well-developed, well-nourished, and in no distress.  HENT:  Head: Normocephalic and atraumatic.  Cardiovascular: Normal rate, regular rhythm and normal heart sounds. Exam reveals no gallop and no friction rub.  No murmur heard. Pulmonary/Chest: Effort normal and breath sounds normal. No respiratory distress. He has no wheezes. He has no rales.  4 cm ovoid rubbery subcutaneous mass noted in the left upper anterior chest wall.  It is mobile.  Slightly tender to touch.  Neurological: He is alert and oriented to person, place, and time.  Skin: Skin is warm and dry.  Vitals reviewed.  CT scan  and MRI reports reviewed Assessment  4 cm subcutaneous mass, left chest wall, probable lipoma Plan   Patient is scheduled for excision of the subcutaneous mass on the chest wall on 01/09/2018.  The risks and benefits of the procedure including bleeding, infection, and the possibility of malignancy were fully explained to the patient, who gave informed consent.

## 2018-01-01 NOTE — Patient Instructions (Signed)
Anthony Anderson  01/01/2018     @PREFPERIOPPHARMACY @   Your procedure is scheduled on  01/09/2018 .  Report to [name] at  615   A.M.  Call this number if you have problems the morning of surgery:  [phone]   Remember:  Do not eat food or drink liquids after midnight.  Take these medicines the morning of surgery with A SIP OF WATER  Nexium, benicar.   Do not wear jewelry, make-up or nail polish.  Do not wear lotions, powders, or perfumes, or deodorant.  Do not shave 48 hours prior to surgery.  Men may shave face and neck.  Do not bring valuables to the hospital.  [facility] is not responsible for any belongings or valuables.  Contacts, dentures or bridgework may not be worn into surgery.  Leave your suitcase in the car.  After surgery it may be brought to your room.  For patients admitted to the hospital, discharge time will be determined by your treatment team.  Patients discharged the day of surgery will not be allowed to drive home.   Name and phone number of your driver:   family Special instructions:  None  Please read over the following fact sheets that you were given. Anesthesia Post-op Instructions and Care and Recovery After Surgery       Lipoma Removal Lipoma removal is a surgical procedure to remove a noncancerous (benign) tumor that is made up of fat cells (lipoma). Most lipomas are small and painless and do not require treatment. They can form in many areas of the body but are most common under the skin of the back, shoulders, arms, and thighs. You may need lipoma removal if you have a lipoma that is large, growing, or causing discomfort. Lipoma removal may also be done for cosmetic reasons. Tell a health care provider about:  Any allergies you have.  All medicines you are taking, including vitamins, herbs, eye drops, creams, and over-the-counter medicines.  Any problems you or family members have had with anesthetic medicines.  Any  blood disorders you have.  Any surgeries you have had.  Any medical conditions you have.  Whether you are pregnant or may be pregnant. What are the risks? Generally, this is a safe procedure. However, problems may occur, including:  Infection.  Bleeding.  Allergic reactions to medicines.  Damage to nerves or blood vessels near the lipoma.  Scarring.  What happens before the procedure? Staying hydrated Follow instructions from your health care provider about hydration, which may include:  Up to 2 hours before the procedure - you may continue to drink clear liquids, such as water, clear fruit juice, black coffee, and plain tea.  Eating and drinking restrictions Follow instructions from your health care provider about eating and drinking, which may include:  8 hours before the procedure - stop eating heavy meals or foods such as meat, fried foods, or fatty foods.  6 hours before the procedure - stop eating light meals or foods, such as toast or cereal.  6 hours before the procedure - stop drinking milk or drinks that contain milk.  2 hours before the procedure - stop drinking clear liquids.  Medicines  Ask your health care provider about: ? Changing or stopping your regular medicines. This is especially important if you are taking diabetes medicines or blood thinners. ? Taking medicines such as aspirin and ibuprofen. These medicines can thin your blood. Do not  take these medicines before your procedure if your health care provider instructs you not to.  You may be given antibiotic medicine to help prevent infection. General instructions  Ask your health care provider how your surgical site will be marked or identified.  You will have a physical exam. Your health care provider will check the size of the lipoma and whether it can be moved easily.  You may have imaging tests, such as: ? X-rays. ? CT scan. ? MRI.  Plan to have someone take you home from the hospital or  clinic. What happens during the procedure?  To reduce your risk of infection: ? Your health care team will wash or sanitize their hands. ? Your skin will be washed with soap.  You will be given one or more of the following: ? A medicine to help you relax (sedative). ? A medicine to numb the area (local anesthetic). ? A medicine to make you fall asleep (general anesthetic). ? A medicine that is injected into an area of your body to numb everything below the injection site (regional anesthetic).  An incision will be made over the lipoma or very near the lipoma. The incision may be made in a natural skin line or crease.  Tissues, nerves, and blood vessels near the lipoma will be moved out of the way.  The lipoma and the capsule that surrounds it will be separated from the surrounding tissues.  The lipoma will be removed.  The incision may be closed with stitches (sutures).  A bandage (dressing) will be placed over the incision. What happens after the procedure?  Do not drive for 24 hours if you received a sedative.  Your blood pressure, heart rate, breathing rate, and blood oxygen level will be monitored until the medicines you were given have worn off. This information is not intended to replace advice given to you by your health care provider. Make sure you discuss any questions you have with your health care provider. Document Released: 12/28/2015 Document Revised: 03/21/2016 Document Reviewed: 12/28/2015 Elsevier Interactive Patient Education  2018 [place].  Lipoma Removal, Care After Refer to this sheet in the next few weeks. These instructions provide you with information about caring for yourself after your procedure. Your health care provider may also give you more specific instructions. Your treatment has been planned according to current medical practices, but problems sometimes occur. Call your health care provider if you have any problems or questions after your  procedure. What can I expect after the procedure? After the procedure, it is common to have:  Mild pain.  Swelling.  Bruising.  Follow these instructions at home:  Bathing  Do not take baths, swim, or use a hot tub until your health care provider approves. Ask your health care provider if you can take showers. You may only be allowed to take sponge baths for bathing.  Keep your bandage (dressing) dry until your health care provider says it can be removed. Incision care   Follow instructions from your health care provider about how to take care of your incision. Make sure you: ? Wash your hands with soap and water before you change your bandage (dressing). If soap and water are not available, use hand sanitizer. ? Change your dressing as told by your health care provider. ? Leave stitches (sutures), skin glue, or adhesive strips in place. These skin closures may need to stay in place for 2 weeks or longer. If adhesive strip edges start to loosen and curl  up, you may trim the loose edges. Do not remove adhesive strips completely unless your health care provider tells you to do that.  Check your incision area every day for signs of infection. Check for: ? More redness, swelling, or pain. ? Fluid or blood. ? Warmth. ? Pus or a bad smell. Driving  Do not drive or operate heavy machinery while taking prescription pain medicine.  Do not drive for 24 hours if you received a medicine to help you relax (sedative) during your procedure.  Ask your health care provider when it is safe for you to drive. General instructions  Take over-the-counter and prescription medicines only as told by your health care provider.  Do not use any tobacco products, such as cigarettes, chewing tobacco, and e-cigarettes. These can delay healing. If you need help quitting, ask your health care provider.  Return to your normal activities as told by your health care provider. Ask your health care provider  what activities are safe for you.  Keep all follow-up visits as told by your health care provider. This is important. Contact a health care provider if:  You have more redness, swelling, or pain around your incision.  You have fluid or blood coming from your incision.  Your incision feels warm to the touch.  You have pus or a bad smell coming from your incision.  You have pain that does not get better with medicine. Get help right away if:  You have chills or a fever.  You have severe pain. This information is not intended to replace advice given to you by your health care provider. Make sure you discuss any questions you have with your health care provider. Document Released: 12/28/2015 Document Revised: 03/26/2016 Document Reviewed: 12/28/2015 Elsevier Interactive Patient Education  2018 [place] Anesthesia, Adult General anesthesia is the use of medicines to make a person "go to sleep" (be unconscious) for a medical procedure. General anesthesia is often recommended when a procedure:  Is long.  Requires you to be still or in an unusual position.  Is major and can cause you to lose blood.  Is impossible to do without general anesthesia.  The medicines used for general anesthesia are called general anesthetics. In addition to making you sleep, the medicines:  Prevent pain.  Control your blood pressure.  Relax your muscles.  Tell a health care provider about:  Any allergies you have.  All medicines you are taking, including vitamins, herbs, eye drops, creams, and over-the-counter medicines.  Any problems you or family members have had with anesthetic medicines.  Types of anesthetics you have had in the past.  Any bleeding disorders you have.  Any surgeries you have had.  Any medical conditions you have.  Any history of heart or lung conditions, such as heart failure, sleep apnea, or chronic obstructive pulmonary disease (COPD).  Whether you  are pregnant or may be pregnant.  Whether you use tobacco, alcohol, marijuana, or street drugs.  Any history of Armed forces logistics/support/administrative officer.  Any history of depression or anxiety. What are the risks? Generally, this is a safe procedure. However, problems may occur, including:  Allergic reaction to anesthetics.  Lung and heart problems.  Inhaling food or liquids from your stomach into your lungs (aspiration).  Injury to nerves.  Waking up during your procedure and being unable to move (rare).  Extreme agitation or a state of mental confusion (delirium) when you wake up from the anesthetic.  Air in the bloodstream, which can lead  to stroke.  These problems are more likely to develop if you are having a major surgery or if you have an advanced medical condition. You can prevent some of these complications by answering all of your health care provider's questions thoroughly and by following all pre-procedure instructions. General anesthesia can cause side effects, including:  Nausea or vomiting  A sore throat from the breathing tube.  Feeling cold or shivery.  Feeling tired, washed out, or achy.  Sleepiness or drowsiness.  Confusion or agitation.  What happens before the procedure? Staying hydrated Follow instructions from your health care provider about hydration, which may include:  Up to 2 hours before the procedure - you may continue to drink clear liquids, such as water, clear fruit juice, black coffee, and plain tea.  Eating and drinking restrictions Follow instructions from your health care provider about eating and drinking, which may include:  8 hours before the procedure - stop eating heavy meals or foods such as meat, fried foods, or fatty foods.  6 hours before the procedure - stop eating light meals or foods, such as toast or cereal.  6 hours before the procedure - stop drinking milk or drinks that contain milk.  2 hours before the procedure - stop drinking clear  liquids.  Medicines  Ask your health care provider about: ? Changing or stopping your regular medicines. This is especially important if you are taking diabetes medicines or blood thinners. ? Taking medicines such as aspirin and ibuprofen. These medicines can thin your blood. Do not take these medicines before your procedure if your health care provider instructs you not to. ? Taking new dietary supplements or medicines. Do not take these during the week before your procedure unless your health care provider approves them.  If you are told to take a medicine or to continue taking a medicine on the day of the procedure, take the medicine with sips of water. General instructions   Ask if you will be going home the same day, the following day, or after a longer hospital stay. ? Plan to have someone take you home. ? Plan to have someone stay with you for the first 24 hours after you leave the hospital or clinic.  For 3-6 weeks before the procedure, try not to use any tobacco products, such as cigarettes, chewing tobacco, and e-cigarettes.  You may brush your teeth on the morning of the procedure, but make sure to spit out the toothpaste. What happens during the procedure?  You will be given anesthetics through a mask and through an IV tube in one of your veins.  You may receive medicine to help you relax (sedative).  As soon as you are asleep, a breathing tube may be used to help you breathe.  An anesthesia specialist will stay with you throughout the procedure. He or she will help keep you comfortable and safe by continuing to give you medicines and adjusting the amount of medicine that you get. He or she will also watch your blood pressure, pulse, and oxygen levels to make sure that the anesthetics do not cause any problems.  If a breathing tube was used to help you breathe, it will be removed before you wake up. The procedure may vary among health care providers and hospitals. What  happens after the procedure?  You will wake up, often slowly, after the procedure is complete, usually in a recovery area.  Your blood pressure, heart rate, breathing rate, and blood oxygen level will be  monitored until the medicines you were given have worn off.  You may be given medicine to help you calm down if you feel anxious or agitated.  If you will be going home the same day, your health care provider may check to make sure you can stand, drink, and urinate.  Your health care providers will treat your pain and side effects before you go home.  Do not drive for 24 hours if you received a sedative.  You may: ? Feel nauseous and vomit. ? Have a sore throat. ? Have mental slowness. ? Feel cold or shivery. ? Feel sleepy. ? Feel tired. ? Feel sore or achy, even in parts of your body where you did not have surgery. This information is not intended to replace advice given to you by your health care provider. Make sure you discuss any questions you have with your health care provider. Document Released: 01/21/2008 Document Revised: 03/26/2016 Document Reviewed: 09/28/2015 Elsevier Interactive Patient Education  2018 [place] Anesthesia, Adult, Care After These instructions provide you with information about caring for yourself after your procedure. Your health care provider may also give you more specific instructions. Your treatment has been planned according to current medical practices, but problems sometimes occur. Call your health care provider if you have any problems or questions after your procedure. What can I expect after the procedure? After the procedure, it is common to have:  Vomiting.  A sore throat.  Mental slowness.  It is common to feel:  Nauseous.  Cold or shivery.  Sleepy.  Tired.  Sore or achy, even in parts of your body where you did not have surgery.  Follow these instructions at home: For at least 24 hours after the  procedure:  Do not: ? Participate in activities where you could fall or become injured. ? Drive. ? Use heavy machinery. ? Drink alcohol. ? Take sleeping pills or medicines that cause drowsiness. ? Make important decisions or sign legal documents. ? Take care of children on your own.  Rest. Eating and drinking  If you vomit, drink water, juice, or soup when you can drink without vomiting.  Drink enough fluid to keep your urine clear or pale yellow.  Make sure you have little or no nausea before eating solid foods.  Follow the diet recommended by your health care provider. General instructions  Have a responsible adult stay with you until you are awake and alert.  Return to your normal activities as told by your health care provider. Ask your health care provider what activities are safe for you.  Take over-the-counter and prescription medicines only as told by your health care provider.  If you smoke, do not smoke without supervision.  Keep all follow-up visits as told by your health care provider. This is important. Contact a health care provider if:  You continue to have nausea or vomiting at home, and medicines are not helpful.  You cannot drink fluids or start eating again.  You cannot urinate after 8-12 hours.  You develop a skin rash.  You have fever.  You have increasing redness at the site of your procedure. Get help right away if:  You have difficulty breathing.  You have chest pain.  You have unexpected bleeding.  You feel that you are having a life-threatening or urgent problem. This information is not intended to replace advice given to you by your health care provider. Make sure you discuss any questions you have with your health care provider.  Document Released: 01/20/2001 Document Revised: 03/18/2016 Document Reviewed: 09/28/2015 Elsevier Interactive Patient Education  [employer].

## 2018-01-05 ENCOUNTER — Other Ambulatory Visit: Payer: Self-pay

## 2018-01-05 ENCOUNTER — Other Ambulatory Visit (HOSPITAL_COMMUNITY): Payer: BLUE CROSS/BLUE SHIELD

## 2018-01-05 ENCOUNTER — Encounter (HOSPITAL_COMMUNITY): Payer: Self-pay

## 2018-01-05 ENCOUNTER — Encounter (HOSPITAL_COMMUNITY)
Admission: RE | Admit: 2018-01-05 | Discharge: 2018-01-05 | Disposition: A | Discharge status: Home / Self Care | Payer: BLUE CROSS/BLUE SHIELD | Source: Ambulatory Visit | Attending: General Surgery | Admitting: General Surgery

## 2018-01-05 DIAGNOSIS — Z01818 Encounter for other preprocedural examination: Secondary | ICD-10-CM | POA: Diagnosis not present

## 2018-01-05 DIAGNOSIS — Z01810 Encounter for preprocedural cardiovascular examination: Secondary | ICD-10-CM | POA: Insufficient documentation

## 2018-01-05 DIAGNOSIS — D171 Benign lipomatous neoplasm of skin and subcutaneous tissue of trunk: Secondary | ICD-10-CM | POA: Diagnosis not present

## 2018-01-05 HISTORY — DX: Sleep apnea, unspecified: G47.30

## 2018-01-05 HISTORY — DX: Malignant (primary) neoplasm, unspecified: C80.1

## 2018-01-05 HISTORY — DX: Personal history of other diseases of the digestive system: Z87.19

## 2018-01-05 LAB — CBC WITH DIFFERENTIAL/PLATELET
Basophils Absolute: 0 10*3/uL (ref 0.0–0.1)
Basophils Relative: 0 %
Eosinophils Absolute: 0.2 10*3/uL (ref 0.0–0.7)
Eosinophils Relative: 3 %
HEMATOCRIT: 48.3 % (ref 39.0–52.0)
Hemoglobin: 16 g/dL (ref 13.0–17.0)
LYMPHS ABS: 1.6 10*3/uL (ref 0.7–4.0)
Lymphocytes Relative: 32 %
MCH: 28.8 pg (ref 26.0–34.0)
MCHC: 33.1 g/dL (ref 30.0–36.0)
MCV: 86.9 fL (ref 78.0–100.0)
MONOS PCT: 12 %
Monocytes Absolute: 0.6 10*3/uL (ref 0.1–1.0)
NEUTROS ABS: 2.7 10*3/uL (ref 1.7–7.7)
NEUTROS PCT: 53 %
Platelets: 181 10*3/uL (ref 150–400)
RBC: 5.56 MIL/uL (ref 4.22–5.81)
RDW: 13 % (ref 11.5–15.5)
WBC: 5.1 10*3/uL (ref 4.0–10.5)

## 2018-01-05 LAB — BASIC METABOLIC PANEL
ANION GAP: 11 (ref 5–15)
BUN: 15 mg/dL (ref 6–20)
CHLORIDE: 107 mmol/L (ref 101–111)
CO2: 23 mmol/L (ref 22–32)
Calcium: 9 mg/dL (ref 8.9–10.3)
Creatinine, Ser: 1.26 mg/dL — ABNORMAL HIGH (ref 0.61–1.24)
GFR calc non Af Amer: >60 mL/min (ref >60)
Glucose, Bld: 109 mg/dL — ABNORMAL HIGH (ref 65–99)
Potassium: 3.7 mmol/L (ref 3.5–5.1)
Sodium: 141 mmol/L (ref 135–145)

## 2018-01-09 ENCOUNTER — Ambulatory Visit (HOSPITAL_COMMUNITY): Payer: BLUE CROSS/BLUE SHIELD | Admitting: Anesthesiology

## 2018-01-09 ENCOUNTER — Encounter (HOSPITAL_COMMUNITY): Payer: Self-pay

## 2018-01-09 ENCOUNTER — Ambulatory Visit (HOSPITAL_COMMUNITY)
Admission: RE | Admit: 2018-01-09 | Discharge: 2018-01-09 | Disposition: A | Discharge status: Home / Self Care | Payer: BLUE CROSS/BLUE SHIELD | Source: Ambulatory Visit | Attending: General Surgery | Admitting: General Surgery

## 2018-01-09 ENCOUNTER — Encounter (HOSPITAL_COMMUNITY)
Admission: RE | Disposition: A | Discharge status: Home / Self Care | Payer: Self-pay | Source: Ambulatory Visit | Attending: General Surgery

## 2018-01-09 DIAGNOSIS — Z79899 Other long term (current) drug therapy: Secondary | ICD-10-CM | POA: Diagnosis not present

## 2018-01-09 DIAGNOSIS — K219 Gastro-esophageal reflux disease without esophagitis: Secondary | ICD-10-CM | POA: Diagnosis not present

## 2018-01-09 DIAGNOSIS — Z87891 Personal history of nicotine dependence: Secondary | ICD-10-CM | POA: Insufficient documentation

## 2018-01-09 DIAGNOSIS — I10 Essential (primary) hypertension: Secondary | ICD-10-CM | POA: Insufficient documentation

## 2018-01-09 DIAGNOSIS — R222 Localized swelling, mass and lump, trunk: Secondary | ICD-10-CM | POA: Diagnosis present

## 2018-01-09 DIAGNOSIS — G4730 Sleep apnea, unspecified: Secondary | ICD-10-CM | POA: Diagnosis not present

## 2018-01-09 DIAGNOSIS — D171 Benign lipomatous neoplasm of skin and subcutaneous tissue of trunk: Secondary | ICD-10-CM | POA: Insufficient documentation

## 2018-01-09 DIAGNOSIS — E785 Hyperlipidemia, unspecified: Secondary | ICD-10-CM | POA: Diagnosis not present

## 2018-01-09 DIAGNOSIS — Z7982 Long term (current) use of aspirin: Secondary | ICD-10-CM | POA: Diagnosis not present

## 2018-01-09 HISTORY — PX: MASS EXCISION: SHX2000

## 2018-01-09 SURGERY — EXCISION MASS
Anesthesia: General | Site: Chest | Laterality: Left

## 2018-01-09 MED ORDER — LIDOCAINE HCL (PF) 1 % IJ SOLN
INTRAMUSCULAR | Status: AC
  Filled 2018-01-09: qty 5

## 2018-01-09 MED ORDER — FENTANYL CITRATE (PF) 250 MCG/5ML IJ SOLN
INTRAMUSCULAR | Status: AC
  Filled 2018-01-09: qty 5

## 2018-01-09 MED ORDER — PROPOFOL 10 MG/ML IV BOLUS
INTRAVENOUS | Status: AC
  Filled 2018-01-09: qty 40

## 2018-01-09 MED ORDER — FENTANYL CITRATE (PF) 100 MCG/2ML IJ SOLN
INTRAMUSCULAR | Status: DC | PRN
  Administered 2018-01-09 (×2): 25 ug via INTRAVENOUS
  Administered 2018-01-09: 50 ug via INTRAVENOUS

## 2018-01-09 MED ORDER — LIDOCAINE HCL (CARDIAC) 10 MG/ML IV SOLN
INTRAVENOUS | Status: DC | PRN
  Administered 2018-01-09: 40 mg via INTRAVENOUS

## 2018-01-09 MED ORDER — PROPOFOL 10 MG/ML IV BOLUS
INTRAVENOUS | Status: DC | PRN
  Administered 2018-01-09: 200 mg via INTRAVENOUS

## 2018-01-09 MED ORDER — BUPIVACAINE HCL (PF) 0.5 % IJ SOLN
INTRAMUSCULAR | Status: DC | PRN
  Administered 2018-01-09: 10 mL

## 2018-01-09 MED ORDER — KETOROLAC TROMETHAMINE 30 MG/ML IJ SOLN
INTRAMUSCULAR | Status: AC
  Filled 2018-01-09: qty 1

## 2018-01-09 MED ORDER — MIDAZOLAM HCL 2 MG/2ML IJ SOLN
1.0000 mg | INTRAMUSCULAR | Status: AC
  Administered 2018-01-09: 2 mg via INTRAVENOUS

## 2018-01-09 MED ORDER — HYDROCODONE-ACETAMINOPHEN 5-325 MG PO TABS: 1.0000 | ORAL_TABLET | Freq: Four times a day (QID) | ORAL | 0 refills | Status: DC | PRN

## 2018-01-09 MED ORDER — SUCCINYLCHOLINE CHLORIDE 20 MG/ML IJ SOLN
INTRAMUSCULAR | Status: AC
  Filled 2018-01-09: qty 1

## 2018-01-09 MED ORDER — BUPIVACAINE HCL (PF) 0.5 % IJ SOLN
INTRAMUSCULAR | Status: AC
  Filled 2018-01-09: qty 30

## 2018-01-09 MED ORDER — ONDANSETRON HCL 4 MG/2ML IJ SOLN
INTRAMUSCULAR | Status: AC
  Filled 2018-01-09: qty 2

## 2018-01-09 MED ORDER — 0.9 % SODIUM CHLORIDE (POUR BTL) OPTIME
TOPICAL | Status: DC | PRN
  Administered 2018-01-09: 1000 mL

## 2018-01-09 MED ORDER — MIDAZOLAM HCL 2 MG/2ML IJ SOLN
INTRAMUSCULAR | Status: AC
  Filled 2018-01-09: qty 2

## 2018-01-09 MED ORDER — ONDANSETRON HCL 4 MG/2ML IJ SOLN
4.0000 mg | Freq: Once | INTRAMUSCULAR | Status: AC
  Administered 2018-01-09: 4 mg via INTRAVENOUS

## 2018-01-09 MED ORDER — KETOROLAC TROMETHAMINE 30 MG/ML IJ SOLN
30.0000 mg | Freq: Once | INTRAMUSCULAR | Status: AC
Start: 2018-01-09 — End: 2018-01-09
  Administered 2018-01-09: 30 mg via INTRAVENOUS

## 2018-01-09 MED ORDER — FENTANYL CITRATE (PF) 100 MCG/2ML IJ SOLN: 25.0000 ug | INTRAMUSCULAR | Status: DC | PRN

## 2018-01-09 MED ORDER — CHLORHEXIDINE GLUCONATE CLOTH 2 % EX PADS: 6.0000 | MEDICATED_PAD | Freq: Once | CUTANEOUS | Status: DC

## 2018-01-09 MED ORDER — LACTATED RINGERS IV SOLN
INTRAVENOUS | Status: DC
  Administered 2018-01-09: 07:00:00 via INTRAVENOUS

## 2018-01-09 SURGICAL SUPPLY — 28 items
BAG HAMPER (MISCELLANEOUS) ×2 IMPLANT
BLADE SURG SZ11 CARB STEEL (BLADE) IMPLANT
CHLORAPREP W/TINT 10.5 ML (MISCELLANEOUS) ×2 IMPLANT
CLOTH BEACON ORANGE TIMEOUT ST (SAFETY) ×2 IMPLANT
COVER LIGHT HANDLE STERIS (MISCELLANEOUS) ×4 IMPLANT
DECANTER SPIKE VIAL GLASS SM (MISCELLANEOUS) ×2 IMPLANT
DERMABOND ADVANCED (GAUZE/BANDAGES/DRESSINGS) ×1
DERMABOND ADVANCED .7 DNX12 (GAUZE/BANDAGES/DRESSINGS) ×1 IMPLANT
ELECT REM PT RETURN 9FT ADLT (ELECTROSURGICAL) ×2
ELECTRODE REM PT RTRN 9FT ADLT (ELECTROSURGICAL) ×1 IMPLANT
GLOVE BIOGEL M 7.0 STRL (GLOVE) ×2 IMPLANT
GLOVE BIOGEL PI IND STRL 7.0 (GLOVE) ×1 IMPLANT
GLOVE BIOGEL PI INDICATOR 7.0 (GLOVE) ×1
GLOVE SURG SS PI 7.5 STRL IVOR (GLOVE) ×2 IMPLANT
GOWN STRL REUS W/ TWL XL LVL3 (GOWN DISPOSABLE) ×1 IMPLANT
GOWN STRL REUS W/TWL LRG LVL3 (GOWN DISPOSABLE) ×2 IMPLANT
GOWN STRL REUS W/TWL XL LVL3 (GOWN DISPOSABLE) ×1
KIT TURNOVER KIT A (KITS) ×2 IMPLANT
MANIFOLD NEPTUNE II (INSTRUMENTS) ×2 IMPLANT
NEEDLE HYPO 25X1 1.5 SAFETY (NEEDLE) ×2 IMPLANT
NS IRRIG 1000ML POUR BTL (IV SOLUTION) ×2 IMPLANT
PACK MINOR (CUSTOM PROCEDURE TRAY) ×2 IMPLANT
PAD ARMBOARD 7.5X6 YLW CONV (MISCELLANEOUS) ×2 IMPLANT
SET BASIN LINEN APH (SET/KITS/TRAYS/PACK) ×2 IMPLANT
SUT MNCRL AB 4-0 PS2 18 (SUTURE) ×2 IMPLANT
SUT VIC AB 3-0 SH 27 (SUTURE) ×1
SUT VIC AB 3-0 SH 27X BRD (SUTURE) ×1 IMPLANT
SYR CONTROL 10ML LL (SYRINGE) ×2 IMPLANT

## 2018-01-09 NOTE — Anesthesia Procedure Notes (Signed)
Procedure Name: LMA Insertion Date/Time: 01/09/2018 7:38 AM Performed by: Ollen Bowl, CRNA Pre-anesthesia Checklist: Patient identified, Patient being monitored, Emergency Drugs available, Timeout performed and Suction available Patient Re-evaluated:Patient Re-evaluated prior to induction Oxygen Delivery Method: Circle System Utilized Preoxygenation: Pre-oxygenation with 100% oxygen Induction Type: IV induction Ventilation: Mask ventilation without difficulty LMA: LMA inserted LMA Size: 4.0 Number of attempts: 1 Placement Confirmation: positive ETCO2 and breath sounds checked- equal and bilateral

## 2018-01-09 NOTE — Anesthesia Postprocedure Evaluation (Signed)
Anesthesia Post Note  Patient: Anthony Anderson  Procedure(s) Performed: EXCISION SUBCUTANEOUS MASS, CHEST WALL (Left Chest)  Patient location during evaluation: Short Stay Anesthesia Type: General Level of consciousness: awake and alert and oriented Pain management: pain level controlled Vital Signs Assessment: post-procedure vital signs reviewed and stable Respiratory status: spontaneous breathing Cardiovascular status: blood pressure returned to baseline and stable Postop Assessment: no apparent nausea or vomiting Anesthetic complications: no     Last Vitals:  Vitals:   01/09/18 0851 01/09/18 0858  BP:  (!) 122/92  Pulse: 70 71  Resp: 17 18  Temp:  36.8 C  SpO2: 92% 93%    Last Pain:  Vitals:   01/09/18 0858  TempSrc: Oral  PainSc:                  [name]

## 2018-01-09 NOTE — Anesthesia Preprocedure Evaluation (Signed)
Anesthesia Evaluation  Patient identified by MRN, date of birth, ID band Patient awake    Reviewed: Allergy & Precautions, NPO status , Patient's Chart, lab work & pertinent test results  Airway Mallampati: II  TM Distance: >3 FB     Dental  (+) Teeth Intact, Partial Upper   Pulmonary shortness of breath and with exertion, sleep apnea , former smoker,    breath sounds clear to auscultation       Cardiovascular hypertension, Pt. on medications + DOE   Rhythm:Regular Rate:Normal     Neuro/Psych    GI/Hepatic hiatal hernia, GERD  Medicated and Controlled,  Endo/Other    Renal/GU Renal disease     Musculoskeletal   Abdominal   Peds  Hematology   Anesthesia Other Findings   Reproductive/Obstetrics                             Anesthesia Physical Anesthesia Plan  ASA: III  Anesthesia Plan: General   Post-op Pain Management:    Induction: Intravenous  PONV Risk Score and Plan:   Airway Management Planned: LMA  Additional Equipment:   Intra-op Plan:   Post-operative Plan: Extubation in OR  Informed Consent: I have reviewed the patients History and Physical, chart, labs and discussed the procedure including the risks, benefits and alternatives for the proposed anesthesia with the patient or authorized representative who has indicated his/her understanding and acceptance.     Plan Discussed with:   Anesthesia Plan Comments:         Anesthesia Quick Evaluation

## 2018-01-09 NOTE — Op Note (Signed)
Patient:  Anthony Anderson  DOB:  01-11-1961  MRN:  [id number]   Preop Diagnosis: 4 cm subcutaneous mass, chest wall  Postop Diagnosis: Same  Procedure: Excision of 4 cm subcutaneous mass, chest wall  Surgeon: Aviva Signs, MD  Anes: General  Indications: Patient is a 57 year old white male who presents with a symptomatic subcutaneous lipoma on the chest wall anteriorly below the left clavicle.  The risks and benefits of the procedure including bleeding, infection, and recurrence of the mass were fully explained to the patient, who gave informed consent.  Procedure note: The patient was placed in supine position.  After general anesthesia was administered, the left upper chest was prepped and draped using the usual sterile technique with DuraPrep.  Surgical site confirmation was performed.  A transverse incision was made over the mass.  The dissection was taken down to the subcutaneous tissue.  A lipoma was found just overlying the pectoralis major muscle.  It was excised without difficulty.  It was sent to pathology for further examination.  Any bleeding was controlled using Bovie electrocautery.  The subcutaneous layer was reapproximated using a 3-0 Vicryl interrupted suture.  0.5% Sensorcaine was instilled into the surrounding wound.  The skin was closed using a 4-0 Monocryl subcuticular suture.  Dermabond was applied.  All tape and needle counts were correct at the end of the procedure.  Patient was awakened and transferred to PACU in stable condition.  Complications: None  EBL: 10 cc  Specimen: Lipoma, chest wall

## 2018-01-09 NOTE — Discharge Instructions (Signed)
Lipoma Removal, Care After Refer to this sheet in the next few weeks. These instructions provide you with information about caring for yourself after your procedure. Your health care provider may also give you more specific instructions. Your treatment has been planned according to current medical practices, but problems sometimes occur. Call your health care provider if you have any problems or questions after your procedure. What can I expect after the procedure? After the procedure, it is common to have:  Mild pain.  Swelling.  Bruising.  Follow these instructions at home:  Bathing  Do not take baths, swim, or use a hot tub until your health care provider approves. Ask your health care provider if you can take showers. You may only be allowed to take sponge baths for bathing.  Keep your bandage (dressing) dry until your health care provider says it can be removed. Incision care   Follow instructions from your health care provider about how to take care of your incision. Make sure you: ? Wash your hands with soap and water before you change your bandage (dressing). If soap and water are not available, use hand sanitizer. ? Change your dressing as told by your health care provider. ? Leave stitches (sutures), skin glue, or adhesive strips in place. These skin closures may need to stay in place for 2 weeks or longer. If adhesive strip edges start to loosen and curl up, you may trim the loose edges. Do not remove adhesive strips completely unless your health care provider tells you to do that.  Check your incision area every day for signs of infection. Check for: ? More redness, swelling, or pain. ? Fluid or blood. ? Warmth. ? Pus or a bad smell. Driving  Do not drive or operate heavy machinery while taking prescription pain medicine.  Do not drive for 24 hours if you received a medicine to help you relax (sedative) during your procedure.  Ask your health care provider when it is  safe for you to drive. General instructions  Take over-the-counter and prescription medicines only as told by your health care provider.  Do not use any tobacco products, such as cigarettes, chewing tobacco, and e-cigarettes. These can delay healing. If you need help quitting, ask your health care provider.  Return to your normal activities as told by your health care provider. Ask your health care provider what activities are safe for you.  Keep all follow-up visits as told by your health care provider. This is important. Contact a health care provider if:  You have more redness, swelling, or pain around your incision.  You have fluid or blood coming from your incision.  Your incision feels warm to the touch.  You have pus or a bad smell coming from your incision.  You have pain that does not get better with medicine. Get help right away if:  You have chills or a fever.  You have severe pain. This information is not intended to replace advice given to you by your health care provider. Make sure you discuss any questions you have with your health care provider. Document Released: 12/28/2015 Document Revised: 03/26/2016 Document Reviewed: 12/28/2015 Elsevier Interactive Patient Education  2018 [place] POST-ANESTHESIA  IMMEDIATELY FOLLOWING SURGERY:  Do not drive or operate machinery for the first twenty four hours after surgery.  Do not make any important decisions for twenty four hours after surgery or while taking narcotic pain medications or sedatives.  If you develop intractable nausea  and vomiting or a severe headache please notify your doctor immediately.  FOLLOW-UP:  Please make an appointment with your surgeon as instructed. You do not need to follow up with anesthesia unless specifically instructed to do so.  WOUND CARE INSTRUCTIONS (if applicable):  Keep a dry clean dressing on the anesthesia/puncture wound site if there is drainage.   Once the wound has quit draining you may leave it open to air.  Generally you should leave the bandage intact for twenty four hours unless there is drainage.  If the epidural site drains for more than 36-48 hours please call the anesthesia department.  QUESTIONS?:  Please feel free to call your physician or the hospital operator if you have any questions, and they will be happy to assist you.

## 2018-01-09 NOTE — Transfer of Care (Signed)
Immediate Anesthesia Transfer of Care Note  Patient: Anthony Anderson  Procedure(s) Performed: EXCISION SUBCUTANEOUS MASS, CHEST WALL (Left Chest)  Patient Location: PACU  Anesthesia Type:MAC  Level of Consciousness: awake, alert  and oriented  Airway & Oxygen Therapy: Patient Spontanous Breathing  Post-op Assessment: Report given to RN  Post vital signs: Reviewed and stable  Last Vitals:  Vitals:   01/09/18 0715 01/09/18 0720  BP: 133/83 (!) 140/95  Pulse:    Resp: (!) 22 20  Temp:    SpO2: 96% 97%    Last Pain:  Vitals:   01/09/18 0643  TempSrc: Oral         Complications: No apparent anesthesia complications

## 2018-01-09 NOTE — Interval H&P Note (Signed)
History and Physical Interval Note:  01/09/2018 7:14 AM  Anthony Anderson  has presented today for surgery, with the diagnosis of lipoma on chest wall  The various methods of treatment have been discussed with the patient and family. After consideration of risks, benefits and other options for treatment, the patient has consented to  Procedure(s): EXCISION SUBCUTANEOUS MASS, CHEST WALL (N/A) as a surgical intervention .  The patient's history has been reviewed, patient examined, no change in status, stable for surgery.  I have reviewed the patient's chart and labs.  Questions were answered to the patient's satisfaction.     Aviva Signs

## 2018-01-12 ENCOUNTER — Encounter (HOSPITAL_COMMUNITY): Payer: Self-pay | Admitting: General Surgery

## 2018-01-15 ENCOUNTER — Encounter: Payer: Self-pay | Admitting: General Surgery

## 2018-01-15 ENCOUNTER — Ambulatory Visit (INDEPENDENT_AMBULATORY_CARE_PROVIDER_SITE_OTHER): Payer: Self-pay | Admitting: General Surgery

## 2018-01-15 VITALS — BP 146/87 | HR 67 | Temp 97.7°F | Ht 70.0 in | Wt 245.0 lb

## 2018-01-15 DIAGNOSIS — Z09 Encounter for follow-up examination after completed treatment for conditions other than malignant neoplasm: Secondary | ICD-10-CM

## 2018-01-15 NOTE — Progress Notes (Signed)
Subjective:     Anthony Anderson  Status post excision of lipoma on chest wall.  Patient states his preoperative symptoms have resolved.  He is pleased with the results. Objective:    BP (!) 146/87   Pulse 67   Temp 97.7 F (36.5 C)   Ht 5\' 10"  (1.778 m)   Wt 245 lb (111.1 kg)   BMI 35.15 kg/m   General:  alert, cooperative and no distress  Incision healing well on chest wall. Final pathology reveals a lipoma.     Assessment:    Doing well postoperatively.    Plan:   Follow-up here as needed.

## 2018-02-12 DIAGNOSIS — I10 Essential (primary) hypertension: Secondary | ICD-10-CM | POA: Diagnosis not present

## 2018-02-12 DIAGNOSIS — Z0000 Encounter for general adult medical examination without abnormal findings: Secondary | ICD-10-CM | POA: Diagnosis not present

## 2018-02-12 DIAGNOSIS — C61 Malignant neoplasm of prostate: Secondary | ICD-10-CM | POA: Diagnosis not present

## 2018-02-12 DIAGNOSIS — E785 Hyperlipidemia, unspecified: Secondary | ICD-10-CM | POA: Diagnosis not present

## 2018-02-24 ENCOUNTER — Ambulatory Visit (INDEPENDENT_AMBULATORY_CARE_PROVIDER_SITE_OTHER): Payer: BLUE CROSS/BLUE SHIELD | Admitting: Urology

## 2018-02-24 DIAGNOSIS — C61 Malignant neoplasm of prostate: Secondary | ICD-10-CM | POA: Diagnosis not present

## 2018-05-07 DIAGNOSIS — K210 Gastro-esophageal reflux disease with esophagitis: Secondary | ICD-10-CM | POA: Diagnosis not present

## 2018-05-07 DIAGNOSIS — C61 Malignant neoplasm of prostate: Secondary | ICD-10-CM | POA: Diagnosis not present

## 2018-05-07 DIAGNOSIS — E785 Hyperlipidemia, unspecified: Secondary | ICD-10-CM | POA: Diagnosis not present

## 2018-05-07 DIAGNOSIS — I10 Essential (primary) hypertension: Secondary | ICD-10-CM | POA: Diagnosis not present

## 2018-06-17 ENCOUNTER — Other Ambulatory Visit: Payer: Self-pay | Admitting: Urology

## 2018-06-17 DIAGNOSIS — C61 Malignant neoplasm of prostate: Secondary | ICD-10-CM

## 2018-06-30 ENCOUNTER — Ambulatory Visit
Admission: RE | Admit: 2018-06-30 | Discharge: 2018-06-30 | Disposition: A | Discharge status: Home / Self Care | Payer: BLUE CROSS/BLUE SHIELD | Source: Ambulatory Visit | Attending: Urology | Admitting: Urology

## 2018-06-30 DIAGNOSIS — C61 Malignant neoplasm of prostate: Secondary | ICD-10-CM

## 2018-06-30 DIAGNOSIS — R972 Elevated prostate specific antigen [PSA]: Secondary | ICD-10-CM | POA: Diagnosis not present

## 2018-06-30 MED ORDER — GADOBENATE DIMEGLUMINE 529 MG/ML IV SOLN
20.0000 mL | Freq: Once | INTRAVENOUS | Status: AC | PRN
  Administered 2018-06-30: 20 mL via INTRAVENOUS

## 2018-07-22 DIAGNOSIS — C61 Malignant neoplasm of prostate: Secondary | ICD-10-CM | POA: Diagnosis not present

## 2018-07-31 DIAGNOSIS — C61 Malignant neoplasm of prostate: Secondary | ICD-10-CM | POA: Diagnosis not present

## 2018-08-12 DIAGNOSIS — C61 Malignant neoplasm of prostate: Secondary | ICD-10-CM | POA: Diagnosis not present

## 2018-08-13 DIAGNOSIS — N5201 Erectile dysfunction due to arterial insufficiency: Secondary | ICD-10-CM | POA: Diagnosis not present

## 2018-08-13 DIAGNOSIS — C61 Malignant neoplasm of prostate: Secondary | ICD-10-CM | POA: Diagnosis not present

## 2018-08-14 ENCOUNTER — Other Ambulatory Visit: Payer: Self-pay | Admitting: Urology

## 2018-09-01 DIAGNOSIS — C61 Malignant neoplasm of prostate: Secondary | ICD-10-CM | POA: Diagnosis not present

## 2018-09-03 NOTE — Progress Notes (Signed)
01/05/2018- noted in [facility]- EKG  12/02/2017- noted in Epic- MR chest w/ w/o contrast  11/21/2017- noted in Epic- CT chest w/contrast

## 2018-09-03 NOTE — Patient Instructions (Addendum)
Anthony Anderson  09/03/2018   Your procedure is scheduled on: Friday 09/11/2018  Report to [facility] Main  Entrance             Report to admitting at  0530  AM            Please bring your CPAP mask and tubing with you the morning of surgery!   Call this number if you have problems the morning of surgery [phone]                Follow Bowel prep instructions from Dr. Louis Meckel the day before surgery with a clear liquid diet up until midnight.    CLEAR LIQUID DIET   Foods Allowed                                                                     Foods Excluded  Coffee and tea, regular and decaf                             liquids that you cannot  Plain Jell-O in any flavor                                             see through such as: Fruit ices (not with fruit pulp)                                     milk, soups, orange juice  Iced Popsicles                                    All solid food Carbonated beverages, regular and diet                                    Cranberry, grape and apple juices Sports drinks like Gatorade Lightly seasoned clear broth or consume(fat free) Sugar, honey syrup  Sample Menu Breakfast                                Lunch                                     Supper Cranberry juice                    Beef broth                            Chicken broth Jell-O  Grape juice                           Apple juice Coffee or tea                        Jell-O                                      Popsicle                                                Coffee or tea                        Coffee or tea  _____________________________________________________________________    Remember: Do not eat food or drink liquids :After Midnight.               BRUSH YOUR TEETH MORNING OF SURGERY AND RINSE YOUR MOUTH OUT, NO CHEWING GUM CANDY OR MINTS.     Take these medicines the morning of surgery  with A SIP OF WATER: Pantoprazole (Protonix), Rosuvastatin (Crestor)                                You may not have any metal on your body including hair pins and              piercings  Do not wear jewelry, make-up, lotions, powders or perfumes, deodorant              Men may shave face and neck.   Do not bring valuables to the hospital. [place].  Contacts, dentures or bridgework may not be worn into surgery.  Leave suitcase in the car. After surgery it may be brought to your room.                  Please read over the following fact sheets you were given: _____________________________________________________________________             [facility] - Preparing for Surgery Before surgery, you can play an important role.  Because skin is not sterile, your skin needs to be as free of germs as possible.  You can reduce the number of germs on your skin by washing with CHG (chlorahexidine gluconate) soap before surgery.  CHG is an antiseptic cleaner which kills germs and bonds with the skin to continue killing germs even after washing. Please DO NOT use if you have an allergy to CHG or antibacterial soaps.  If your skin becomes reddened/irritated stop using the CHG and inform your nurse when you arrive at Short Stay. Do not shave (including legs and underarms) for at least 48 hours prior to the first CHG shower.  You may shave your face/neck. Please follow these instructions carefully:  1.  Shower with CHG Soap the night before surgery and the  morning of Surgery.  2.  If you choose to wash your hair, wash your hair first as usual with your  normal  shampoo.  3.  After  you shampoo, rinse your hair and body thoroughly to remove the  shampoo.                           4.  Use CHG as you would any other liquid soap.  You can apply chg directly  to the skin and wash                       Gently with a scrungie or clean washcloth.  5.  Apply the  CHG Soap to your body ONLY FROM THE NECK DOWN.   Do not use on face/ open                           Wound or open sores. Avoid contact with eyes, ears mouth and genitals (private parts).                       Wash face,  Genitals (private parts) with your normal soap.             6.  Wash thoroughly, paying special attention to the area where your surgery  will be performed.  7.  Thoroughly rinse your body with warm water from the neck down.  8.  DO NOT shower/wash with your normal soap after using and rinsing off  the CHG Soap.                9.  Pat yourself dry with a clean towel.            10.  Wear clean pajamas.            11.  Place clean sheets on your bed the night of your first shower and do not  sleep with pets. Day of Surgery : Do not apply any lotions/deodorants the morning of surgery.  Please wear clean clothes to the hospital/surgery center.  FAILURE TO FOLLOW THESE INSTRUCTIONS MAY RESULT IN THE CANCELLATION OF YOUR SURGERY PATIENT SIGNATURE_________________________________  NURSE SIGNATURE__________________________________  ________________________________________________________________________  WHAT IS A BLOOD TRANSFUSION? Blood Transfusion Information  A transfusion is the replacement of blood or some of its parts. Blood is made up of multiple cells which provide different functions.  Red blood cells carry oxygen and are used for blood loss replacement.  White blood cells fight against infection.  Platelets control bleeding.  Plasma helps clot blood.  Other blood products are available for specialized needs, such as hemophilia or other clotting disorders. BEFORE THE TRANSFUSION  Who gives blood for transfusions?   Healthy volunteers who are fully evaluated to make sure their blood is safe. This is blood bank blood. Transfusion therapy is the safest it has ever been in the practice of medicine. Before blood is taken from a donor, a complete history is taken to  make sure that person has no history of diseases nor engages in risky social behavior (examples are intravenous drug use or sexual activity with multiple partners). The donor's travel history is screened to minimize risk of transmitting infections, such as malaria. The donated blood is tested for signs of infectious diseases, such as HIV and hepatitis. The blood is then tested to be sure it is compatible with you in order to minimize the chance of a transfusion reaction. If you or a relative donates blood, this is often done in anticipation of surgery and is not appropriate for emergency situations.  It takes many days to process the donated blood. RISKS AND COMPLICATIONS Although transfusion therapy is very safe and saves many lives, the main dangers of transfusion include:   Getting an infectious disease.  Developing a transfusion reaction. This is an allergic reaction to something in the blood you were given. Every precaution is taken to prevent this. The decision to have a blood transfusion has been considered carefully by your caregiver before blood is given. Blood is not given unless the benefits outweigh the risks. AFTER THE TRANSFUSION  Right after receiving a blood transfusion, you will usually feel much better and more energetic. This is especially true if your red blood cells have gotten low (anemic). The transfusion raises the level of the red blood cells which carry oxygen, and this usually causes an energy increase.  The nurse administering the transfusion will monitor you carefully for complications. HOME CARE INSTRUCTIONS  No special instructions are needed after a transfusion. You may find your energy is better. Speak with your caregiver about any limitations on activity for underlying diseases you may have. SEEK MEDICAL CARE IF:   Your condition is not improving after your transfusion.  You develop redness or irritation at the intravenous (IV) site. SEEK IMMEDIATE MEDICAL CARE  IF:  Any of the following symptoms occur over the next 12 hours:  Shaking chills.  You have a temperature by mouth above 102 F (38.9 C), not controlled by medicine.  Chest, back, or muscle pain.  People around you feel you are not acting correctly or are confused.  Shortness of breath or difficulty breathing.  Dizziness and fainting.  You get a rash or develop hives.  You have a decrease in urine output.  Your urine turns a dark color or changes to pink, red, or brown. Any of the following symptoms occur over the next 10 days:  You have a temperature by mouth above 102 F (38.9 C), not controlled by medicine.  Shortness of breath.  Weakness after normal activity.  The white part of the eye turns yellow (jaundice).  You have a decrease in the amount of urine or are urinating less often.  Your urine turns a dark color or changes to pink, red, or brown. Document Released: 10/11/2000 Document Revised: 01/06/2012 Document Reviewed: 05/30/2008 [facility] Patient Information 2014 [place], [place].  _______________________________________________________________________

## 2018-09-07 ENCOUNTER — Other Ambulatory Visit: Payer: Self-pay

## 2018-09-07 ENCOUNTER — Encounter (HOSPITAL_COMMUNITY): Payer: Self-pay

## 2018-09-07 ENCOUNTER — Encounter (HOSPITAL_COMMUNITY)
Admission: RE | Admit: 2018-09-07 | Discharge: 2018-09-07 | Disposition: A | Discharge status: Home / Self Care | Payer: BLUE CROSS/BLUE SHIELD | Source: Ambulatory Visit | Attending: Urology | Admitting: Urology

## 2018-09-07 DIAGNOSIS — C61 Malignant neoplasm of prostate: Secondary | ICD-10-CM | POA: Insufficient documentation

## 2018-09-07 DIAGNOSIS — Z01812 Encounter for preprocedural laboratory examination: Secondary | ICD-10-CM | POA: Insufficient documentation

## 2018-09-07 HISTORY — DX: Personal history of urinary calculi: Z87.442

## 2018-09-07 LAB — COMPREHENSIVE METABOLIC PANEL
ALT: 30 U/L (ref 0–44)
ANION GAP: 8 (ref 5–15)
AST: 33 U/L (ref 15–41)
Albumin: 4.5 g/dL (ref 3.5–5.0)
Alkaline Phosphatase: 55 U/L (ref 38–126)
BILIRUBIN TOTAL: 1.2 mg/dL (ref 0.3–1.2)
BUN: 16 mg/dL (ref 6–20)
CALCIUM: 9.7 mg/dL (ref 8.9–10.3)
CO2: 29 mmol/L (ref 22–32)
Chloride: 108 mmol/L (ref 98–111)
Creatinine, Ser: 1.52 mg/dL — ABNORMAL HIGH (ref 0.61–1.24)
GFR calc Af Amer: 57 mL/min — ABNORMAL LOW (ref >60)
GFR, EST NON AFRICAN AMERICAN: 49 mL/min — AB (ref >60)
Glucose, Bld: 99 mg/dL (ref 70–99)
Potassium: 4.6 mmol/L (ref 3.5–5.1)
Sodium: 145 mmol/L (ref 135–145)
TOTAL PROTEIN: 6.9 g/dL (ref 6.5–8.1)

## 2018-09-07 LAB — CBC
HEMATOCRIT: 51.5 % (ref 39.0–52.0)
Hemoglobin: 17 g/dL (ref 13.0–17.0)
MCH: 29.6 pg (ref 26.0–34.0)
MCHC: 33 g/dL (ref 30.0–36.0)
MCV: 89.7 fL (ref 80.0–100.0)
Platelets: 222 10*3/uL (ref 150–400)
RBC: 5.74 MIL/uL (ref 4.22–5.81)
RDW: 12.9 % (ref 11.5–15.5)
WBC: 6.7 10*3/uL (ref 4.0–10.5)
nRBC: 0 % (ref 0.0–0.2)

## 2018-09-07 LAB — ABO/RH: ABO/RH(D): O NEG

## 2018-09-08 LAB — URINE CULTURE: Culture: NO GROWTH

## 2018-09-09 DIAGNOSIS — G4733 Obstructive sleep apnea (adult) (pediatric): Secondary | ICD-10-CM | POA: Diagnosis not present

## 2018-09-10 NOTE — Anesthesia Preprocedure Evaluation (Addendum)
Anesthesia Evaluation  Patient identified by MRN, date of birth, ID band Patient awake    Reviewed: Allergy & Precautions, NPO status , Patient's Chart, lab work & pertinent test results  History of Anesthesia Complications Negative for: history of anesthetic complications  Airway Mallampati: II  TM Distance: >3 FB Neck ROM: Full    Dental  (+) Partial Upper, Dental Advisory Given   Pulmonary sleep apnea and Continuous Positive Airway Pressure Ventilation , COPD, former smoker (quit 1983),    breath sounds clear to auscultation       Cardiovascular hypertension, Pt. on medications (-) angina Rhythm:Regular Rate:Normal  '15 ECHO: EF 65% to 70%. Mild MR   Neuro/Psych negative neurological ROS     GI/Hepatic Neg liver ROS, GERD  Medicated and Controlled,  Endo/Other  Morbid obesity  Renal/GU Renal InsufficiencyRenal disease (creat 1.52)H/o stones   Prostate cancer    Musculoskeletal   Abdominal (+) + obese,   Peds  Hematology negative hematology ROS (+)   Anesthesia Other Findings   Reproductive/Obstetrics                            Anesthesia Physical Anesthesia Plan  ASA: III  Anesthesia Plan: General   Post-op Pain Management:    Induction: Intravenous  PONV Risk Score and Plan: 3 and Ondansetron and Dexamethasone  Airway Management Planned: Oral ETT  Additional Equipment:   Intra-op Plan:   Post-operative Plan: Extubation in OR  Informed Consent: I have reviewed the patients History and Physical, chart, labs and discussed the procedure including the risks, benefits and alternatives for the proposed anesthesia with the patient or authorized representative who has indicated his/her understanding and acceptance.   Dental advisory given  Plan Discussed with: CRNA and Surgeon  Anesthesia Plan Comments: (Plan routine monitors, GETA)        Anesthesia Quick  Evaluation

## 2018-09-11 ENCOUNTER — Encounter (HOSPITAL_COMMUNITY)
Admission: RE | Disposition: A | Discharge status: Home / Self Care | Payer: Self-pay | Source: Ambulatory Visit | Attending: Urology

## 2018-09-11 ENCOUNTER — Ambulatory Visit (HOSPITAL_COMMUNITY): Payer: BLUE CROSS/BLUE SHIELD | Admitting: Anesthesiology

## 2018-09-11 ENCOUNTER — Encounter (HOSPITAL_COMMUNITY): Payer: Self-pay | Admitting: Certified Registered Nurse Anesthetist

## 2018-09-11 ENCOUNTER — Other Ambulatory Visit: Payer: Self-pay

## 2018-09-11 ENCOUNTER — Observation Stay (HOSPITAL_COMMUNITY)
Admission: RE | Admit: 2018-09-11 | Discharge: 2018-09-12 | Disposition: A | Discharge status: Home / Self Care | Payer: BLUE CROSS/BLUE SHIELD | Source: Ambulatory Visit | Attending: Urology | Admitting: Urology

## 2018-09-11 DIAGNOSIS — C61 Malignant neoplasm of prostate: Principal | ICD-10-CM | POA: Diagnosis present

## 2018-09-11 DIAGNOSIS — N529 Male erectile dysfunction, unspecified: Secondary | ICD-10-CM | POA: Diagnosis not present

## 2018-09-11 DIAGNOSIS — N401 Enlarged prostate with lower urinary tract symptoms: Secondary | ICD-10-CM | POA: Insufficient documentation

## 2018-09-11 DIAGNOSIS — I10 Essential (primary) hypertension: Secondary | ICD-10-CM | POA: Diagnosis not present

## 2018-09-11 DIAGNOSIS — I771 Stricture of artery: Secondary | ICD-10-CM | POA: Diagnosis not present

## 2018-09-11 DIAGNOSIS — Z79899 Other long term (current) drug therapy: Secondary | ICD-10-CM | POA: Diagnosis not present

## 2018-09-11 DIAGNOSIS — K219 Gastro-esophageal reflux disease without esophagitis: Secondary | ICD-10-CM | POA: Diagnosis not present

## 2018-09-11 DIAGNOSIS — E785 Hyperlipidemia, unspecified: Secondary | ICD-10-CM | POA: Diagnosis not present

## 2018-09-11 HISTORY — PX: PELVIC LYMPH NODE DISSECTION: SHX6543

## 2018-09-11 HISTORY — PX: ROBOT ASSISTED LAPAROSCOPIC RADICAL PROSTATECTOMY: SHX5141

## 2018-09-11 LAB — TYPE AND SCREEN
ABO/RH(D): O NEG
Antibody Screen: NEGATIVE

## 2018-09-11 LAB — HEMOGLOBIN AND HEMATOCRIT, BLOOD
HEMATOCRIT: 45.8 % (ref 39.0–52.0)
Hemoglobin: 15.3 g/dL (ref 13.0–17.0)

## 2018-09-11 SURGERY — PROSTATECTOMY, RADICAL, ROBOT-ASSISTED, LAPAROSCOPIC
Anesthesia: General

## 2018-09-11 MED ORDER — ONDANSETRON HCL 4 MG/2ML IJ SOLN
INTRAMUSCULAR | Status: DC | PRN
  Administered 2018-09-11: 4 mg via INTRAVENOUS

## 2018-09-11 MED ORDER — MEPERIDINE HCL 50 MG/ML IJ SOLN: 6.2500 mg | INTRAMUSCULAR | Status: DC | PRN

## 2018-09-11 MED ORDER — STERILE WATER FOR IRRIGATION IR SOLN
Status: DC | PRN
  Administered 2018-09-11: 1000 mL

## 2018-09-11 MED ORDER — DEXAMETHASONE SODIUM PHOSPHATE 10 MG/ML IJ SOLN
INTRAMUSCULAR | Status: DC | PRN
  Administered 2018-09-11: 10 mg via INTRAVENOUS

## 2018-09-11 MED ORDER — LIDOCAINE 2% (20 MG/ML) 5 ML SYRINGE
INTRAMUSCULAR | Status: DC | PRN
  Administered 2018-09-11: 60 mg via INTRAVENOUS

## 2018-09-11 MED ORDER — TRAMADOL HCL 50 MG PO TABS: 50.0000 mg | ORAL_TABLET | Freq: Four times a day (QID) | ORAL | 0 refills | Status: AC | PRN

## 2018-09-11 MED ORDER — SODIUM CHLORIDE 0.9 % IV BOLUS
1000.0000 mL | Freq: Once | INTRAVENOUS | Status: AC
  Administered 2018-09-11: 1000 mL via INTRAVENOUS

## 2018-09-11 MED ORDER — DOCUSATE SODIUM 100 MG PO CAPS
100.0000 mg | ORAL_CAPSULE | Freq: Two times a day (BID) | ORAL | Status: DC
  Administered 2018-09-12: 100 mg via ORAL
  Filled 2018-09-11: qty 1

## 2018-09-11 MED ORDER — DEXTROSE-NACL 5-0.45 % IV SOLN
INTRAVENOUS | Status: AC
  Administered 2018-09-11 – 2018-09-12 (×3): via INTRAVENOUS

## 2018-09-11 MED ORDER — PROPOFOL 10 MG/ML IV BOLUS
INTRAVENOUS | Status: AC
  Filled 2018-09-11: qty 20

## 2018-09-11 MED ORDER — SOOTHE XP OP SOLN: 1.0000 [drp] | Freq: Every evening | OPHTHALMIC | Status: DC | PRN

## 2018-09-11 MED ORDER — FENTANYL CITRATE (PF) 100 MCG/2ML IJ SOLN
INTRAMUSCULAR | Status: AC
  Filled 2018-09-11: qty 2

## 2018-09-11 MED ORDER — TRAMADOL HCL 50 MG PO TABS
50.0000 mg | ORAL_TABLET | Freq: Four times a day (QID) | ORAL | Status: DC | PRN
  Administered 2018-09-12: 50 mg via ORAL
  Filled 2018-09-11: qty 1

## 2018-09-11 MED ORDER — LOSARTAN POTASSIUM 50 MG PO TABS
100.0000 mg | ORAL_TABLET | Freq: Every day | ORAL | Status: DC
  Administered 2018-09-12: 100 mg via ORAL
  Filled 2018-09-11: qty 2

## 2018-09-11 MED ORDER — FENTANYL CITRATE (PF) 250 MCG/5ML IJ SOLN
INTRAMUSCULAR | Status: DC | PRN
  Administered 2018-09-11 (×2): 50 ug via INTRAVENOUS
  Administered 2018-09-11: 100 ug via INTRAVENOUS
  Administered 2018-09-11 (×5): 50 ug via INTRAVENOUS

## 2018-09-11 MED ORDER — TRAMADOL HCL 50 MG PO TABS: 100.0000 mg | ORAL_TABLET | Freq: Four times a day (QID) | ORAL | Status: DC | PRN

## 2018-09-11 MED ORDER — MIDAZOLAM HCL 5 MG/5ML IJ SOLN
INTRAMUSCULAR | Status: DC | PRN
  Administered 2018-09-11: 2 mg via INTRAVENOUS

## 2018-09-11 MED ORDER — MORPHINE SULFATE (PF) 2 MG/ML IV SOLN
1.0000 mg | INTRAVENOUS | Status: DC | PRN
  Administered 2018-09-11 – 2018-09-12 (×4): 2 mg via INTRAVENOUS
  Filled 2018-09-11 (×4): qty 1

## 2018-09-11 MED ORDER — ROCURONIUM BROMIDE 10 MG/ML (PF) SYRINGE
PREFILLED_SYRINGE | INTRAVENOUS | Status: DC | PRN
  Administered 2018-09-11 (×3): 30 mg via INTRAVENOUS
  Administered 2018-09-11: 40 mg via INTRAVENOUS
  Administered 2018-09-11: 60 mg via INTRAVENOUS
  Administered 2018-09-11: 10 mg via INTRAVENOUS

## 2018-09-11 MED ORDER — ACETAMINOPHEN 10 MG/ML IV SOLN
1000.0000 mg | Freq: Four times a day (QID) | INTRAVENOUS | Status: DC
  Administered 2018-09-11 – 2018-09-12 (×3): 1000 mg via INTRAVENOUS
  Filled 2018-09-11 (×4): qty 100

## 2018-09-11 MED ORDER — CEFAZOLIN SODIUM-DEXTROSE 2-4 GM/100ML-% IV SOLN
2.0000 g | INTRAVENOUS | Status: AC
  Administered 2018-09-11 (×2): 2 g via INTRAVENOUS
  Filled 2018-09-11: qty 100

## 2018-09-11 MED ORDER — BUPIVACAINE-EPINEPHRINE (PF) 0.25% -1:200000 IJ SOLN
INTRAMUSCULAR | Status: DC | PRN
  Administered 2018-09-11: 15 mL

## 2018-09-11 MED ORDER — MIDAZOLAM HCL 2 MG/2ML IJ SOLN: 0.5000 mg | Freq: Once | INTRAMUSCULAR | Status: DC | PRN

## 2018-09-11 MED ORDER — SENNA 8.6 MG PO TABS
1.0000 | ORAL_TABLET | Freq: Two times a day (BID) | ORAL | Status: DC
  Administered 2018-09-12: 8.6 mg via ORAL
  Filled 2018-09-11: qty 1

## 2018-09-11 MED ORDER — PANTOPRAZOLE SODIUM 40 MG PO TBEC
40.0000 mg | DELAYED_RELEASE_TABLET | Freq: Every day | ORAL | Status: DC
  Administered 2018-09-12: 40 mg via ORAL
  Filled 2018-09-11: qty 1

## 2018-09-11 MED ORDER — ONDANSETRON HCL 4 MG/2ML IJ SOLN: 4.0000 mg | INTRAMUSCULAR | Status: DC | PRN

## 2018-09-11 MED ORDER — HYDROMORPHONE HCL 1 MG/ML IJ SOLN
INTRAMUSCULAR | Status: AC
  Filled 2018-09-11: qty 1

## 2018-09-11 MED ORDER — SODIUM CHLORIDE (PF) 0.9 % IJ SOLN
INTRAMUSCULAR | Status: AC
  Filled 2018-09-11: qty 20

## 2018-09-11 MED ORDER — LIDOCAINE 2% (20 MG/ML) 5 ML SYRINGE
INTRAMUSCULAR | Status: AC
  Filled 2018-09-11: qty 5

## 2018-09-11 MED ORDER — LACTATED RINGERS IV SOLN
INTRAVENOUS | Status: DC
  Administered 2018-09-11: 1000 mL via INTRAVENOUS
  Administered 2018-09-11: 10:00:00 via INTRAVENOUS

## 2018-09-11 MED ORDER — SUGAMMADEX SODIUM 200 MG/2ML IV SOLN
INTRAVENOUS | Status: DC | PRN
  Administered 2018-09-11: 400 mg via INTRAVENOUS

## 2018-09-11 MED ORDER — ROCURONIUM BROMIDE 10 MG/ML (PF) SYRINGE
PREFILLED_SYRINGE | INTRAVENOUS | Status: AC
  Filled 2018-09-11: qty 10

## 2018-09-11 MED ORDER — HYDROMORPHONE HCL 1 MG/ML IJ SOLN
0.2500 mg | INTRAMUSCULAR | Status: DC | PRN
  Administered 2018-09-11 (×4): 0.5 mg via INTRAVENOUS

## 2018-09-11 MED ORDER — KETOROLAC TROMETHAMINE 15 MG/ML IJ SOLN
15.0000 mg | Freq: Four times a day (QID) | INTRAMUSCULAR | Status: DC
  Administered 2018-09-11 – 2018-09-12 (×4): 15 mg via INTRAVENOUS
  Filled 2018-09-11 (×4): qty 1

## 2018-09-11 MED ORDER — DEXAMETHASONE SODIUM PHOSPHATE 10 MG/ML IJ SOLN
INTRAMUSCULAR | Status: AC
  Filled 2018-09-11: qty 1

## 2018-09-11 MED ORDER — POLYVINYL ALCOHOL 1.4 % OP SOLN
1.0000 [drp] | Freq: Every evening | OPHTHALMIC | Status: DC | PRN
  Filled 2018-09-11: qty 15

## 2018-09-11 MED ORDER — BUPIVACAINE LIPOSOME 1.3 % IJ SUSP
20.0000 mL | Freq: Once | INTRAMUSCULAR | Status: AC
  Administered 2018-09-11: 20 mL
  Filled 2018-09-11: qty 20

## 2018-09-11 MED ORDER — MIDAZOLAM HCL 2 MG/2ML IJ SOLN
INTRAMUSCULAR | Status: AC
  Filled 2018-09-11: qty 2

## 2018-09-11 MED ORDER — LACTATED RINGERS IR SOLN
Status: DC | PRN
  Administered 2018-09-11: 1000 mL

## 2018-09-11 MED ORDER — SUGAMMADEX SODIUM 500 MG/5ML IV SOLN
INTRAVENOUS | Status: AC
  Filled 2018-09-11: qty 5

## 2018-09-11 MED ORDER — CEFAZOLIN SODIUM-DEXTROSE 2-4 GM/100ML-% IV SOLN
INTRAVENOUS | Status: AC
  Filled 2018-09-11: qty 100

## 2018-09-11 MED ORDER — ROSUVASTATIN CALCIUM 10 MG PO TABS
10.0000 mg | ORAL_TABLET | Freq: Every day | ORAL | Status: DC
  Administered 2018-09-12: 10 mg via ORAL
  Filled 2018-09-11: qty 1

## 2018-09-11 MED ORDER — BUPIVACAINE-EPINEPHRINE (PF) 0.25% -1:200000 IJ SOLN
INTRAMUSCULAR | Status: AC
  Filled 2018-09-11: qty 30

## 2018-09-11 MED ORDER — ONDANSETRON HCL 4 MG/2ML IJ SOLN
INTRAMUSCULAR | Status: AC
  Filled 2018-09-11: qty 2

## 2018-09-11 MED ORDER — FENTANYL CITRATE (PF) 250 MCG/5ML IJ SOLN
INTRAMUSCULAR | Status: AC
  Filled 2018-09-11: qty 5

## 2018-09-11 MED ORDER — CEFAZOLIN SODIUM-DEXTROSE 1-4 GM/50ML-% IV SOLN
1.0000 g | Freq: Three times a day (TID) | INTRAVENOUS | Status: AC
  Administered 2018-09-11 – 2018-09-12 (×2): 1 g via INTRAVENOUS
  Filled 2018-09-11 (×2): qty 50

## 2018-09-11 MED ORDER — SODIUM CHLORIDE (PF) 0.9 % IJ SOLN
INTRAMUSCULAR | Status: DC | PRN
  Administered 2018-09-11: 20 mL

## 2018-09-11 MED ORDER — PROPOFOL 10 MG/ML IV BOLUS
INTRAVENOUS | Status: DC | PRN
  Administered 2018-09-11: 180 mg via INTRAVENOUS

## 2018-09-11 MED ORDER — SULFAMETHOXAZOLE-TRIMETHOPRIM 800-160 MG PO TABS: 1.0000 | ORAL_TABLET | Freq: Two times a day (BID) | ORAL | 0 refills | Status: AC

## 2018-09-11 MED ORDER — PROMETHAZINE HCL 25 MG/ML IJ SOLN: 6.2500 mg | INTRAMUSCULAR | Status: DC | PRN

## 2018-09-11 SURGICAL SUPPLY — 54 items
CATH FOLEY 2WAY SLVR 18FR 30CC (CATHETERS) ×3 IMPLANT
CATH TIEMANN FOLEY 18FR 5CC (CATHETERS) ×3 IMPLANT
CHLORAPREP W/TINT 26ML (MISCELLANEOUS) ×3 IMPLANT
CLIP VESOLOCK LG 6/CT PURPLE (CLIP) ×9 IMPLANT
COVER SURGICAL LIGHT HANDLE (MISCELLANEOUS) ×3 IMPLANT
COVER TIP SHEARS 8 DVNC (MISCELLANEOUS) ×4 IMPLANT
COVER TIP SHEARS 8MM DA VINCI (MISCELLANEOUS) ×2
COVER WAND RF STERILE (DRAPES) ×3 IMPLANT
CUTTER ECHEON FLEX ENDO 45 340 (ENDOMECHANICALS) ×3 IMPLANT
DECANTER SPIKE VIAL GLASS SM (MISCELLANEOUS) ×3 IMPLANT
DERMABOND ADVANCED (GAUZE/BANDAGES/DRESSINGS) ×1
DERMABOND ADVANCED .7 DNX12 (GAUZE/BANDAGES/DRESSINGS) ×2 IMPLANT
DRAIN CHANNEL RND F F (WOUND CARE) ×3 IMPLANT
DRAPE ARM DVNC X/XI (DISPOSABLE) ×8 IMPLANT
DRAPE COLUMN DVNC XI (DISPOSABLE) ×2 IMPLANT
DRAPE DA VINCI XI ARM (DISPOSABLE) ×4
DRAPE DA VINCI XI COLUMN (DISPOSABLE) ×1
DRAPE SURG IRRIG POUCH 19X23 (DRAPES) ×3 IMPLANT
DRSG TEGADERM 4X4.75 (GAUZE/BANDAGES/DRESSINGS) ×3 IMPLANT
DRSG TEGADERM 6X8 (GAUZE/BANDAGES/DRESSINGS) ×3 IMPLANT
ELECT PENCIL ROCKER SW 15FT (MISCELLANEOUS) ×3 IMPLANT
ELECT REM PT RETURN 15FT ADLT (MISCELLANEOUS) ×3 IMPLANT
GAUZE SPONGE 2X2 8PLY STRL LF (GAUZE/BANDAGES/DRESSINGS) IMPLANT
GLOVE BIO SURGEON STRL SZ 6.5 (GLOVE) ×6 IMPLANT
GLOVE BIOGEL M STRL SZ7.5 (GLOVE) ×12 IMPLANT
GOWN STRL REUS W/TWL LRG LVL3 (GOWN DISPOSABLE) ×6 IMPLANT
GOWN STRL REUS W/TWL XL LVL3 (GOWN DISPOSABLE) ×12 IMPLANT
HOLDER FOLEY CATH W/STRAP (MISCELLANEOUS) ×3 IMPLANT
IRRIG SUCT STRYKERFLOW 2 WTIP (MISCELLANEOUS) ×3
IRRIGATION SUCT STRKRFLW 2 WTP (MISCELLANEOUS) ×2 IMPLANT
PACK ROBOT UROLOGY CUSTOM (CUSTOM PROCEDURE TRAY) ×3 IMPLANT
PAD POSITIONING PINK XL (MISCELLANEOUS) ×3 IMPLANT
PORT ACCESS TROCAR AIRSEAL 12 (TROCAR) ×2 IMPLANT
PORT ACCESS TROCAR AIRSEAL 5M (TROCAR) ×1
SEAL CANN UNIV 5-8 DVNC XI (MISCELLANEOUS) ×8 IMPLANT
SEAL XI 5MM-8MM UNIVERSAL (MISCELLANEOUS) ×4
SET TRI-LUMEN FLTR TB AIRSEAL (TUBING) ×3 IMPLANT
SOLUTION ELECTROLUBE (MISCELLANEOUS) ×3 IMPLANT
SPONGE GAUZE 2X2 STER 10/PKG (GAUZE/BANDAGES/DRESSINGS)
STAPLE RELOAD 45 GRN (STAPLE) ×2 IMPLANT
STAPLE RELOAD 45MM GREEN (STAPLE) ×1
SUT ETHILON 3 0 PS 1 (SUTURE) ×3 IMPLANT
SUT MNCRL AB 4-0 PS2 18 (SUTURE) ×6 IMPLANT
SUT V-LOC BARB 180 2/0GR6 GS22 (SUTURE) ×3
SUT VIC AB 0 CT1 27 (SUTURE) ×1
SUT VIC AB 0 CT1 27XBRD ANTBC (SUTURE) ×2 IMPLANT
SUT VIC AB 2-0 SH 27 (SUTURE) ×1
SUT VIC AB 2-0 SH 27X BRD (SUTURE) ×2 IMPLANT
SUT VICRYL 0 UR6 27IN ABS (SUTURE) ×6 IMPLANT
SUT VLOC BARB 180 ABS3/0GR12 (SUTURE) ×6
SUTURE V-LC BRB 180 2/0GR6GS22 (SUTURE) ×2 IMPLANT
SUTURE VLOC BRB 180 ABS3/0GR12 (SUTURE) ×4 IMPLANT
TOWEL OR NON WOVEN STRL DISP B (DISPOSABLE) ×3 IMPLANT
WATER STERILE IRR 1000ML POUR (IV SOLUTION) ×3 IMPLANT

## 2018-09-11 NOTE — Transfer of Care (Signed)
Immediate Anesthesia Transfer of Care Note  Patient: Anthony Anderson  Procedure(s) Performed: XI ROBOTIC ASSISTED LAPAROSCOPIC RADICAL PROSTATECTOMY (N/A ) PELVIC LYMPH NODE DISSECTION (Bilateral )  Patient Location: PACU  Anesthesia Type:General  Level of Consciousness: awake, alert , oriented and patient cooperative  Airway & Oxygen Therapy: Patient Spontanous Breathing and Patient connected to face mask oxygen  Post-op Assessment: Report given to RN, Post -op Vital signs reviewed and stable and Patient moving all extremities  Post vital signs: Reviewed and stable  Last Vitals:  Vitals Value Taken Time  BP 137/90 09/11/2018 12:15 PM  Temp    Pulse 91 09/11/2018 12:16 PM  Resp 14 09/11/2018 12:16 PM  SpO2 100 % 09/11/2018 12:16 PM  Vitals shown include unvalidated device data.  Last Pain:  Vitals:   09/11/18 0647  TempSrc:   PainSc: 0-No pain      Patients Stated Pain Goal: 4 (38/88/28 0034)  Complications: No apparent anesthesia complications

## 2018-09-11 NOTE — Anesthesia Postprocedure Evaluation (Signed)
Anesthesia Post Note  Patient: Anthony Anderson  Procedure(s) Performed: XI ROBOTIC ASSISTED LAPAROSCOPIC RADICAL PROSTATECTOMY (N/A ) PELVIC LYMPH NODE DISSECTION (Bilateral )     Patient location during evaluation: PACU Anesthesia Type: General Level of consciousness: awake and alert, oriented and patient cooperative Pain management: pain level controlled Vital Signs Assessment: post-procedure vital signs reviewed and stable Respiratory status: spontaneous breathing, nonlabored ventilation and respiratory function stable Cardiovascular status: blood pressure returned to baseline and stable Postop Assessment: no apparent nausea or vomiting Anesthetic complications: no    Last Vitals:  Vitals:   09/11/18 1400 09/11/18 1424  BP: 126/81 123/86  Pulse: 86 83  Resp: 13 (!) 24  Temp: 36.9 C 36.8 C  SpO2: 96% 94%    Last Pain:  Vitals:   09/11/18 1424  TempSrc: Oral  PainSc:                  Marshal Eskew,E. Nitish Roes

## 2018-09-11 NOTE — Plan of Care (Signed)
Patient ambulated in hallway this afternoon.  Patient tolerated well.

## 2018-09-11 NOTE — Anesthesia Procedure Notes (Signed)
Procedure Name: Intubation Date/Time: 09/11/2018 7:29 AM Performed by: Mitzie Na, CRNA Pre-anesthesia Checklist: Patient identified, Emergency Drugs available, Suction available, Patient being monitored and Timeout performed Patient Re-evaluated:Patient Re-evaluated prior to induction Oxygen Delivery Method: Circle system utilized Preoxygenation: Pre-oxygenation with 100% oxygen Induction Type: IV induction Ventilation: Oral airway inserted - appropriate to patient size, Two handed mask ventilation required and Mask ventilation with difficulty Laryngoscope Size: Glidescope and 4 Grade View: Grade I Tube type: Oral Tube size: 7.5 mm Number of attempts: 1 Airway Equipment and Method: Stylet Placement Confirmation: ETT inserted through vocal cords under direct vision,  positive ETCO2 and breath sounds checked- equal and bilateral Secured at: 25 cm Tube secured with: Tape Dental Injury: Teeth and Oropharynx as per pre-operative assessment

## 2018-09-11 NOTE — Op Note (Signed)
Preoperative diagnosis:  1. Prostate Cancer   Postoperative diagnosis:  1. same   Procedure: 1. Robotic assisted laparoscopic radical prostatectomy 2. Bilateral pelvic lymph node dissection  Surgeon: Ardis Hughs, MD First Assistant: Arminda Resides, MD  Anesthesia: General  Complications: None  Intraoperative findings: difficult to get clean plane posteriorly.  Entered base of prostate, but frozen sections were negative.  Bilateral nerve spare.  EBL: 400  Specimens:  #1.  Prostate and seminal vesicals #2.  Bilateral pelvic lymph nodes  Indication: Anthony Anderson is a 57 y.o. patient with prostate cancer.  After reviewing the management options for treatment, he elected to proceed with the removal of his prostate. We have discussed the potential benefits and risks of the procedure, side effects of the proposed treatment, the likelihood of the patient achieving the goals of the procedure, and any potential problems that might occur during the procedure or recuperation. Informed consent has been obtained.  Description of procedure:  The patient was consented in the preoperative holding area. He was in brought back to the operating room placed the table in supine position. General anesthesia was then induced and endotracheal tube was inserted. He was then placed in dorsolithotomy position and placed in steep Trendelenburg. He was then prepped and draped in the routine sterile fashion. We, the first assistant and I, then began by making a 10 mm incision supraumbilical midline incision the skin down through into the peritoneum. Then placed a 8 mm trocar. I then inflated the abdomen and inserted the 0 robotic lens. We then placed 2 additional a 8 millimeter trochars in the patient's left lower abdomen proximally 9 cm apart and 2 trochars on the patient's right lower abdomen, one was an 8 mm trocar and the one most lateral was a 12 mm trocar which was used as the assistant port. A 5 mm  trocar was placed by triangulating the 2 right lateral ports as a second assistant port. These ports were all placed under visual guidance. Once the ports were noted to be satisfactory position the robot was docked. We started with the 0 lens, monopolar scissors in the right hand and the [place] forceps the left hand as well as a fenestrated grasper as the third arm on the left-hand side.   We, the first assistant and I,  began our dissection the posterior plane incising the peritoneum at the level of the vas deferens. Isolated the left vas deferens and dissected it proximally towards the spermatic cord for 5 cm prior to ligating it. Then used this as traction to isolate the left the seminal vesicle which was then undressed bluntly and completely dissected out, all vessels were cauterized with a combination of bipolar and the monopolar scissors. We then turned our attention to the right side and similarly dissected out the right vas deferens and seminal vesicle. Once the SVs had been freed, we turned our attention to the posterior plane and bluntly dissected the tissue between the rectum and the posterior wall of the prostate bluntly out towards the apex.    At this point the bladder was taken down starting at the urachal remnant with a combination of both blunt dissection and sharp dissection using monopolar cautery the bladder was dropped down in the usual fashion to the medial umbilical ligaments laterally and the dorsal vein of the prostate anteriorly creating our space of Retzius. We then turned our attention to the endopelvic fascia which was incised laterally starting on the patient's right-hand side the levator  muscles were pushed off the prostate laterally up towards the dorsal vein complex on the right-hand side. This process was then repeated on the left-hand side and a nice notch was created for the dorsal vein. I then used a 6mm stapler to staple the dorsal vein.   We,the first assistant and I,  then located the bladder neck at the vesicoprostatic junction and using the monopolar scissors dissected down through the perivesical tissues and the bladder neck down to the prostatic urethra. The catheter was then deflated and pulled through our urethral opening and then used to retract the prostate anteriorly for the posterior bladder neck dissection. Once through the bladder neck and into the posterior plane of the prostate, the SVs were brought through the opening. The left pedicle was then isolated and systematically ligated with Weck clips and scissors. The nerve bundle was then peeled off the posterior lateral aspect of the prostate and bluntly dissected away off the prostate.  This was then repeated on the right side.    I then came down through the dorsal venous complex anteriorly down to the membranous urethra using the monopolar. Once down to the urethra, it was transected sharply and the apex of the prostate was then dissected off the levator and rectourethralis muscles. Once the apex of the prostate had been dissected free we came back to the base of the prostate and bluntly push the rectum and nerve vascular bundle off the prostate the patient's left and used clips on the patient's right to free the prostate. Once the prostate was free it was placed off to the side. The pelvis was then irrigated with normal saline and noted to be relatively hemostatic.  Attention was then turned to the right pelvic sidewall. The fibrofatty tissue between the external iliac vein, confluence of the iliac vessels, hypogastric artery, and Cooper's ligament was dissected free from the pelvic sidewall with care to preserve the obturator nerve. Weck clips were used for lymphostasis and hemostasis. An identical procedure was performed on the contralateral side and the lymphatic packets were removed for permanent pathologic analysis.  The prostate and both lymph node tissues were placed in the Endo Catch bag and the  string brought to the 5 mm port.    The vesicourethral anastomosis was then completed with 2 interlocking 3-0 V. lock sutures running the anastomosis in the 6:00 position to the 12:00 position on each side and then tying it off on the top. The final catheter was then passed through the patient's urethra and into the bladder and 120 cc was instilled into the bladder to test the anastomosis. As there was no leak a 64 [place] Blake drain was passed through the left lateral port and placed around the vesicourethral anastomosis. A 12 mm assistant port on the right lateral side was then closed with 0 Vicryl with the help of the [employer] needle. The 12 mm midline infraumbilical incision was then extended another centimeter taken down and the fascia opened to remove the Endo Catch bag with the prostate specimen. The fascia was then closed with a 0 Vicryl and all skin ports were closed with 4-0 Monocryl in a subcutaneous fashion. Dermabond glue was then applied to the incisions. The drain was then secured to the skin with a 0 nylon stitch and dressing applied.   At the end of the case all laps needles and sponges had been accounted for. There no immediate complications. The patient returned to the PACU in stable condition.

## 2018-09-11 NOTE — H&P (Signed)
I have prostate cancer (treatment).  HPI: Anthony Anderson is a 57 year-old male established patient who is here for prostate cancer which has been treated.  He is participating in active surveillance. He has been under active surveillance for 12/20/2014. He has had a repeat biopsy.   He does have problems with erections. He does not have an abnormal sensation when needing to urinate. He does not have to strain or bear down to start his urinary stream.   He is not having pain in new locations. He does have a good appetite. BOWEL HABITS: his bowels are moving normally. He has not recently had unwanted weight loss.   He underwent ultrasound and biopsy of his prostate on 2.23.2016. PSA was 4.8. Prostatic volume was 62 cc. PSA density 0.08.  2/12 cores positive for adenocarcinoma, both lesions revealed GS 3+3 pattern-- 1 in the right apex, one in the left apex. Both cores had ~ 5% of cancer involvement.   We decided on active surveillance. He was put on finasteride due to the large prostate and voiding symptomatology.   4.19.2016: Oncotype Dx sent. GPS score 17. Likelihood of favorable pathology 87%, freedom from high grade/non organ confined disease 93/94 % respectively.   He underwent surveillance biopsy on 10.13.2016. 2 cores revealed atypia--right apex lateral, right mid lateral. No cores revealed adenocarcinoma. Pre biopsy MRI revealed no suspicious lesions.   9.25.2019: Presented for fusion TRUS/Bx. Recent prostate MRI (9.3.2019) revealed PIRADS 3 lesion--8x11 mm in left lateral base. No evidence of extraprostatic disease. Path--1/4 cores from ROI revealed PCa--GS 3+4 pattern (30% of core involved). 1/12 systematic cores revealed PCa--right apex lateral, 20% of core involved with GS 3+4 pattern.   Intv: Patient seen today for discussion of his prostate cancer, in follow-up after his discussion with Dr. Diona Fanti. He has settled on having surgery for his prostate cancer, and would like move forward  with me. He has recovered well from his prostate biopsy. He has mild voiding symptoms and he has moderate erectile dysfunction. He uses 40mg  Sildendafil for adequate function. He does not take any medication for his voiding symptoms.   He is married, works as a Therapist, music. He has PMH of HTN and HLD. He has never had abdominal surgery.     AUA Symptom Score: Less than 20% of the time he has the sensation of not emptying his bladder completely when finished urinating. 50% of the time he has to urinate again fewer than two hours after he has finished urinating. Less than 50% of the time he has to start and stop again several times when he urinates. Less than 50% of the time he finds it difficult to postpone urination. Less than 20% of the time he has a weak urinary stream. He never has to push or strain to begin urination. He has to get up to urinate 1 time from the time he goes to bed until the time he gets up in the morning.   Calculated AUA Symptom Score: 10    ALLERGIES: No Allergies    MEDICATIONS: Sildenafil 20 mg tablet 2 tablet PO prn  Benicar 40 mg tablet Oral  Crestor 10 mg tablet Oral  Multiple Vitamin  Nexium 40 mg capsule,delayed release Oral     GU PSH: Prostate Needle Biopsy - 07/22/2018      PSH Notes: Kidney Surgery, Sinus Surgery   NON-GU PSH: Surgical Pathology, Gross And Microscopic Examination For Prostate Needle - 07/22/2018    GU PMH: Prostate Cancer - 08/12/2018, -  07/22/2018 (Stable), Low risk/low volume prostate cancer, followed with active surveillance. PSA up 0.4 since his last months ago. persistent benign exam, - 02/24/2018 (Stable), PSA up as expected w/ coming off of finasteride. Low volume/low grade cancer. Benign exam., - 08/26/2017 (Stable), Basically stable PSA (on finasteride). Essentially negative confirmatory biopsy after original bx in 2016, - 02/18/2017, Adenocarcinoma of prostate. Stage TIc, Gleason 3+3 = 6 in 2/12 cores, both small volume  involvement. PSA density 0.08. Surveillance biopsy was performed on 08/08/2015. This revealed2 areas of atypia, no discrete adenocarcinoma. He has followed with active surveillance, most recent PSA is stable at 2.9-corrected for finasteride, 5.8., - 08/20/2016, Adenocarcinoma of prostate, - 2017 ED due to arterial insufficiency - 02/24/2018, (Worsening), He would like another trial of Viagra, - 08/26/2017 (Stable), - 02/18/2017 (Stable), He has mild detumescence with sexual activity. PDE 5 inhibitors cause headaches., - 08/20/2016 Urinary Urgency, Urinary urgency - 2017 BPH w/LUTS, Benign prostatic hyperplasia with lower urinary tract symptoms - 2016 Elevated PSA, Elevated prostate specific antigen (PSA) - 2016 Renal calculus, Nephrolithiasis - 2016 Renal cyst, Renal cyst, acquired, left - 2016    NON-GU PMH: Personal history of other diseases of the circulatory system, History of hypertension - 2016 Personal history of other diseases of the digestive system, History of esophageal reflux - 2016 Personal history of other endocrine, nutritional and metabolic disease, History of hypercholesterolemia - 2016 Encounter for general adult medical examination without abnormal findings, Encounter for preventive health examination    FAMILY HISTORY: Death - Runs In Family Prostate Cancer - Runs In Family Recurrent nephrolithiasis - Runs In Family   SOCIAL HISTORY: Marital Status: Married     Notes: Former smoker, Alcohol use, Occupation, Caffeine use, Married, Tobacco use   REVIEW OF SYSTEMS:    GU Review Male:   Patient denies frequent urination, hard to postpone urination, burning/ pain with urination, get up at night to urinate, leakage of urine, stream starts and stops, trouble starting your stream, have to strain to urinate , erection problems, and penile pain.  Gastrointestinal (Upper):   Patient denies nausea, vomiting, and indigestion/ heartburn.  Gastrointestinal (Lower):   Patient denies  diarrhea and constipation.  Constitutional:   Patient denies fever, night sweats, weight loss, and fatigue.  Skin:   Patient denies skin rash/ lesion and itching.  Eyes:   Patient denies blurred vision and double vision.  Ears/ Nose/ Throat:   Patient denies sore throat and sinus problems.  Hematologic/Lymphatic:   Patient denies swollen glands and easy bruising.  Cardiovascular:   Patient denies leg swelling and chest pains.  Respiratory:   Patient denies cough and shortness of breath.  Endocrine:   Patient denies excessive thirst.  Musculoskeletal:   Patient denies back pain and joint pain.  Neurological:   Patient denies headaches and dizziness.  Psychologic:   Patient denies anxiety and depression.   VITAL SIGNS:      08/13/2018 03:47 PM  Weight 248 lb / 112.49 kg  Height 70 in / 177.8 cm  BP 141/91 mmHg  Pulse 66 /min  BMI 35.6 kg/m   MULTI-SYSTEM PHYSICAL EXAMINATION:    Constitutional: Well-nourished. No physical deformities. Normally developed. Good grooming.  Neck: Neck symmetrical, not swollen. Normal tracheal position.  Respiratory: Normal breath sounds. No labored breathing, no use of accessory muscles.   Cardiovascular: Regular rate and rhythm. No murmur, no gallop. Normal temperature, normal extremity pulses, no swelling, no varicosities.   Skin: No paleness, no jaundice, no cyanosis. No lesion, no  ulcer, no rash.  Neurologic / Psychiatric: Oriented to time, oriented to place, oriented to person. No depression, no anxiety, no agitation.  Eyes: Normal conjunctivae. Normal eyelids.  Ears, Nose, Mouth, and Throat: Left ear no scars, no lesions, no masses. Right ear no scars, no lesions, no masses. Nose no scars, no lesions, no masses. Normal hearing. Normal lips.  Musculoskeletal: Normal gait and station of head and neck.     PAST DATA REVIEWED:  Source Of History:  Patient  Lab Test Review:   PSA  Records Review:   Pathology Reports, Previous Doctor Records, Previous  Patient Records, POC Tool  Urine Test Review:   Urinalysis   07/22/18 02/12/18 08/18/17 02/07/17 08/15/16 02/02/16 05/06/15 12/07/14  PSA  Total PSA 6.40 ng/mL 6.3 ng/dl 5.6 ng/dl 3.0 ng/dl 2.9 ng/dl 2.71  2.67  4.79   Free PSA        0.82   % Free PSA        17     PROCEDURES: None   ASSESSMENT:      ICD-10 Details  1 GU:   Prostate Cancer - C61   2   ED due to arterial insufficiency - N52.01   3   BPH w/LUTS - N40.1    PLAN:           Document Letter(s):  Created for Patient: Clinical Summary    We discussed prostatectomy and specifically robotic prostatectomy with bilateral pelvic lymphadenectomy. I showed the patient on their abdomen the approximately 6 small incision (trocar) sites as well as presumed extraction sites with robotic approach as well as possible open incision sites should open conversion be necessary. We discussed peri-operative risks including bleeding, infection, deep vein thrombosis, pulmonary embolism, compartment syndrome, neuropathy / neuropraxia, heart attack, stroke, death, as well as long-term risks such as non-cure / need for additional therapy. We specifically addressed that the procedure would compromise urinary control leading to stress incontinence which typically resolves with time and pelvic rehabilitation (Kegel's, etc..), but can sometimes be permanent and require additional therapy including surgery. We also specifically addressed sexual side-effects including significant erectile dysfunction which typically partially resolves with time but can also be permanent and require additional therapy including surgery.   We discussed the typical hospital course including usual 1-2 night hospitalization, discharge with foley catheter in place usually for 1-2 weeks before voiding trial as well as usually 2 week recovery until able to perform most non-strenuous activity and 6 weeks until able to return to most jobs and more strenuous activity such as exercise.          Notes:   Plan to perform bilateral nerve sparing prostatectomy and bilateral lymph nodes.

## 2018-09-11 NOTE — Discharge Instructions (Signed)
Foley Catheter Care, Adult A soft, flexible tube (Foley catheter) has been placed in your bladder. This may be done to temporarily help with urine drainage after an operation or to relieve blockage from an enlarged prostate gland. HOME CARE INSTRUCTIONS  If you are going home with a Foley catheter in place, follow these instructions:  Taking Care of the Catheter:  Keep the area where the catheter leaves your body clean.  Attach the catheter to the leg so there is no tension on the catheter.  Keep the drainage bag below the level of the bladder, but keep it OFF the floor.  Do not take long soaking baths.   Wash your hands before touching ANYTHING related to the catheter or bag.  Using mild soap and warm water on a washcloth:  Clean the area closest to the catheter insertion site using a circular motion around the catheter.  Clean the catheter itself by wiping AWAY from the insertion site for several inches down the tube.  NEVER wipe upward as this could sweep bacteria up into the urethra (tube in your body that normally drains the bladder) and cause infection. Taking Care of the Drainage Bags:  Two drainage bags will be taken home: a large overnight drainage bag, and a smaller leg bag which fits underneath clothing.  It is okay to wear the overnight bag at any time, but NEVER wear the smaller leg bag at night.  Keep the drainage bag well below the level of your bladder. This prevents backflow of urine into the bladder and allows the urine to drain freely.  Anchor the tubing to your leg to prevent pulling or tension on the catheter. Use tape or a leg strap provided by the hospital.  Empty the drainage bag when it is  to  full. Wash your hands before and after touching the bag.  Periodically check the tubing for kinks to make sure there is no pressure on the tubing which could restrict the flow of urine. Changing the Drainage Bags:  Cleanse both ends of the clean bag with  alcohol before changing.  Pinch off the rubber catheter to avoid urine spillage during the disconnection.  Disconnect the dirty bag and connect the clean one.  Empty the dirty bag carefully to avoid a urine spill.  Attach the new bag to the leg with tape or a leg strap. Cleaning the Drainage Bags:  Whenever a drainage bag is disconnected, it must be cleaned quickly so it is ready for the next use.  Wash the bag in warm, soapy water.  Rinse the bag thoroughly with warm water.  Soak the bag for 30 minutes in a solution of white vinegar and water (1 cup vinegar to 1 quart warm water).  Rinse with warm water. SEEK MEDICAL CARE IF:   Some pain develops in the kidney (lower back) area.  The urine is cloudy or smells bad.  There is some blood in the urine.  The catheter becomes clogged and/or there is no urine drainage. SEEK IMMEDIATE MEDICAL CARE IF:   You have moderate or severe pain in the kidney region.  You start to throw up (vomit).  Blood fills the tube.  Worsening belly (abdominal) pain develops.  You have a fever. MAKE SURE YOU:   Understand these instructions.  Will watch your condition.  Will get help right away if you are not doing well or get worse.  Call [facility] if you have any questions or concerns: [phone]  Activity:  You are encouraged to ambulate frequently (about every hour during waking hours) to help prevent blood clots from forming in your legs or lungs.  However, you should not engage in any heavy lifting (> 10-15 lbs), strenuous activity, or straining.   Diet: You should advance your diet as instructed by your physician.  It will be normal to have some bloating, nausea, and abdominal discomfort intermittently.   Prescriptions:  You will be provided a prescription for pain medication to take as needed.  If your pain is not severe enough to require the prescription pain medication, you may take extra strength Tylenol instead  which will have less side effects.  You should also take a prescribed stool softener to avoid straining with bowel movements as the prescription pain medication may constipate you.   Incisions: You may remove your dressing bandages 48 hours after surgery if not removed in the hospital.  You will either have some small staples or special tissue glue at each of the incision sites. Once the bandages are removed (if present), the incisions may stay open to air.  You may start showering (but not soaking or bathing in water) the 2nd day after surgery and the incisions simply need to be patted dry after the shower.  No additional care is needed.   What to call us about: You should call the office [phone]) if you develop fever > 101 or develop persistent vomiting. Activity:  You are encouraged to ambulate frequently (about every hour during waking hours) to help prevent blood clots from forming in your legs or lungs.  However, you should not engage in any heavy lifting (> 10-15 lbs), strenuous activity, or straining.

## 2018-09-12 ENCOUNTER — Encounter (HOSPITAL_COMMUNITY): Payer: Self-pay | Admitting: Urology

## 2018-09-12 DIAGNOSIS — N529 Male erectile dysfunction, unspecified: Secondary | ICD-10-CM | POA: Diagnosis not present

## 2018-09-12 DIAGNOSIS — I10 Essential (primary) hypertension: Secondary | ICD-10-CM | POA: Diagnosis not present

## 2018-09-12 DIAGNOSIS — E785 Hyperlipidemia, unspecified: Secondary | ICD-10-CM | POA: Diagnosis not present

## 2018-09-12 DIAGNOSIS — N401 Enlarged prostate with lower urinary tract symptoms: Secondary | ICD-10-CM | POA: Diagnosis not present

## 2018-09-12 DIAGNOSIS — Z79899 Other long term (current) drug therapy: Secondary | ICD-10-CM | POA: Diagnosis not present

## 2018-09-12 DIAGNOSIS — I771 Stricture of artery: Secondary | ICD-10-CM | POA: Diagnosis not present

## 2018-09-12 DIAGNOSIS — C61 Malignant neoplasm of prostate: Secondary | ICD-10-CM | POA: Diagnosis not present

## 2018-09-12 LAB — BASIC METABOLIC PANEL
ANION GAP: 8 (ref 5–15)
BUN: 20 mg/dL (ref 6–20)
CO2: 22 mmol/L (ref 22–32)
Calcium: 8.4 mg/dL — ABNORMAL LOW (ref 8.9–10.3)
Chloride: 105 mmol/L (ref 98–111)
Creatinine, Ser: 1.53 mg/dL — ABNORMAL HIGH (ref 0.61–1.24)
GFR, EST AFRICAN AMERICAN: 56 mL/min — AB (ref >60)
GFR, EST NON AFRICAN AMERICAN: 48 mL/min — AB (ref >60)
Glucose, Bld: 119 mg/dL — ABNORMAL HIGH (ref 70–99)
POTASSIUM: 4.2 mmol/L (ref 3.5–5.1)
SODIUM: 135 mmol/L (ref 135–145)

## 2018-09-12 LAB — HEMOGLOBIN AND HEMATOCRIT, BLOOD
HEMATOCRIT: 41.6 % (ref 39.0–52.0)
Hemoglobin: 13.9 g/dL (ref 13.0–17.0)

## 2018-09-12 LAB — CREATININE, FLUID (PLEURAL, PERITONEAL, JP DRAINAGE): Creat, Fluid: 1.4 mg/dL

## 2018-09-12 NOTE — Discharge Summary (Signed)
  Physician Discharge Summary      Patient ID: Anthony Anderson MRN: [id number] DOB/AGE: [birth year] 57 y.o.  Admit date: 09/11/2018 Discharge date: 09/12/2018  Admission Diagnoses: PROSTATE CANCER  Discharge Diagnoses:  Active Problems:   Prostate cancer [facility])   Discharged Condition: good  Hospital Course: He underwent an elective robotic assisted laparoscopic radical prostatectomy for prostate cancer on 09/11/2018.  Overnight he has done well.  He denies any nausea or vomiting but has been on a clear liquid diet and had not passed flatus when he was seen on morning rounds.  His port sites looked good with no sign of infection or discharge.  His JP drain output had decreased to 50 cc over the previous shift and his Foley catheter drainage was clear. JP creatinine - 1.4. Drain removed.  Discharge Exam: Blood pressure 122/79, pulse 72, temperature 98 F (36.7 C), temperature source Oral, resp. rate 17, height 5\' 10"  (1.778 m), weight 112.5 kg, SpO2 93 %. General: Awake, alert and in no apparent distress. Chest: Normal respiratory effort. Cardiovascular: Regular rate and rhythm. Abdomen: Soft, nontender, nondistended.  Port sites without sign of infection, discharge or ecchymoses. GU: Foley catheter indwelling and draining clear urine.   Disposition: He will be discharged home with his Foley catheter with follow-up next week for Foley catheter removal.   Allergies as of 09/12/2018   No Known Allergies     Medication List    STOP taking these medications   HYDROcodone-acetaminophen 5-325 MG tablet Commonly known as:  NORCO/VICODIN     TAKE these medications   losartan 100 MG tablet Commonly known as:  COZAAR Take 100 mg by mouth daily.   multivitamin with minerals Tabs tablet Take 1 tablet by mouth daily.   pantoprazole 40 MG tablet Commonly known as:  PROTONIX Take 40 mg by mouth daily.   rosuvastatin 10 MG tablet Commonly known as:  CRESTOR Take 10 mg by mouth  daily.   SOOTHE XP OP Place 1 drop into both eyes at bedtime as needed (for dry eyes).   sulfamethoxazole-trimethoprim 800-160 MG tablet Commonly known as:  BACTRIM DS,SEPTRA DS Take 1 tablet by mouth 2 (two) times daily for 3 days. Start the day before your follow-up appointment.   traMADol 50 MG tablet Commonly known as:  ULTRAM Take 1 tablet (50 mg total) by mouth every 6 (six) hours as needed for up to 5 days for severe pain.      Follow-up Information    Ardis Hughs, MD On 09/21/2018.   Specialty:  Urology Why:  at 2:15pm Contact information: [address] [place] [address] [phone]           Signed: Claybon Jabs 09/12/2018, 8:18 AM

## 2018-09-12 NOTE — Progress Notes (Signed)
Went over discharge papers with patient and family. All questions answered.  Leg bag teaching completed with teach back.  AVS and prescriptions given to patient.

## 2018-10-06 DIAGNOSIS — M6281 Muscle weakness (generalized): Secondary | ICD-10-CM | POA: Diagnosis not present

## 2018-10-06 DIAGNOSIS — N393 Stress incontinence (female) (male): Secondary | ICD-10-CM | POA: Diagnosis not present

## 2018-10-09 DIAGNOSIS — N393 Stress incontinence (female) (male): Secondary | ICD-10-CM | POA: Diagnosis not present

## 2018-10-09 DIAGNOSIS — M6281 Muscle weakness (generalized): Secondary | ICD-10-CM | POA: Diagnosis not present

## 2018-10-09 DIAGNOSIS — M62838 Other muscle spasm: Secondary | ICD-10-CM | POA: Diagnosis not present

## 2018-10-27 DIAGNOSIS — M62838 Other muscle spasm: Secondary | ICD-10-CM | POA: Diagnosis not present

## 2018-10-27 DIAGNOSIS — M6281 Muscle weakness (generalized): Secondary | ICD-10-CM | POA: Diagnosis not present

## 2018-10-27 DIAGNOSIS — N393 Stress incontinence (female) (male): Secondary | ICD-10-CM | POA: Diagnosis not present

## 2018-11-03 DIAGNOSIS — Z8546 Personal history of malignant neoplasm of prostate: Secondary | ICD-10-CM | POA: Diagnosis not present

## 2018-11-11 DIAGNOSIS — C61 Malignant neoplasm of prostate: Secondary | ICD-10-CM | POA: Diagnosis not present

## 2018-11-19 DIAGNOSIS — M62838 Other muscle spasm: Secondary | ICD-10-CM | POA: Diagnosis not present

## 2018-11-19 DIAGNOSIS — N393 Stress incontinence (female) (male): Secondary | ICD-10-CM | POA: Diagnosis not present

## 2018-11-19 DIAGNOSIS — M6281 Muscle weakness (generalized): Secondary | ICD-10-CM | POA: Diagnosis not present

## 2018-12-07 DIAGNOSIS — Z23 Encounter for immunization: Secondary | ICD-10-CM | POA: Diagnosis not present

## 2018-12-07 DIAGNOSIS — I10 Essential (primary) hypertension: Secondary | ICD-10-CM | POA: Diagnosis not present

## 2018-12-07 DIAGNOSIS — K219 Gastro-esophageal reflux disease without esophagitis: Secondary | ICD-10-CM | POA: Diagnosis not present

## 2018-12-07 DIAGNOSIS — E782 Mixed hyperlipidemia: Secondary | ICD-10-CM | POA: Diagnosis not present

## 2018-12-21 DIAGNOSIS — M6281 Muscle weakness (generalized): Secondary | ICD-10-CM | POA: Diagnosis not present

## 2018-12-21 DIAGNOSIS — M62838 Other muscle spasm: Secondary | ICD-10-CM | POA: Diagnosis not present

## 2018-12-21 DIAGNOSIS — N393 Stress incontinence (female) (male): Secondary | ICD-10-CM | POA: Diagnosis not present

## 2018-12-22 DIAGNOSIS — N529 Male erectile dysfunction, unspecified: Secondary | ICD-10-CM | POA: Diagnosis not present

## 2018-12-22 DIAGNOSIS — E782 Mixed hyperlipidemia: Secondary | ICD-10-CM | POA: Diagnosis not present

## 2018-12-22 DIAGNOSIS — I10 Essential (primary) hypertension: Secondary | ICD-10-CM | POA: Diagnosis not present

## 2018-12-22 DIAGNOSIS — Z0000 Encounter for general adult medical examination without abnormal findings: Secondary | ICD-10-CM | POA: Diagnosis not present

## 2018-12-28 DIAGNOSIS — N182 Chronic kidney disease, stage 2 (mild): Secondary | ICD-10-CM | POA: Diagnosis not present

## 2018-12-28 DIAGNOSIS — I129 Hypertensive chronic kidney disease with stage 1 through stage 4 chronic kidney disease, or unspecified chronic kidney disease: Secondary | ICD-10-CM | POA: Diagnosis not present

## 2018-12-28 DIAGNOSIS — K219 Gastro-esophageal reflux disease without esophagitis: Secondary | ICD-10-CM | POA: Diagnosis not present

## 2018-12-28 DIAGNOSIS — Z0000 Encounter for general adult medical examination without abnormal findings: Secondary | ICD-10-CM | POA: Diagnosis not present

## 2019-02-09 DIAGNOSIS — C61 Malignant neoplasm of prostate: Secondary | ICD-10-CM | POA: Diagnosis not present

## 2019-02-15 DIAGNOSIS — N5231 Erectile dysfunction following radical prostatectomy: Secondary | ICD-10-CM | POA: Diagnosis not present

## 2019-02-15 DIAGNOSIS — N393 Stress incontinence (female) (male): Secondary | ICD-10-CM | POA: Diagnosis not present

## 2019-02-15 DIAGNOSIS — C61 Malignant neoplasm of prostate: Secondary | ICD-10-CM | POA: Diagnosis not present

## 2019-05-27 DIAGNOSIS — Z0000 Encounter for general adult medical examination without abnormal findings: Secondary | ICD-10-CM | POA: Diagnosis not present

## 2019-05-27 DIAGNOSIS — R079 Chest pain, unspecified: Secondary | ICD-10-CM | POA: Diagnosis not present

## 2019-05-27 DIAGNOSIS — I129 Hypertensive chronic kidney disease with stage 1 through stage 4 chronic kidney disease, or unspecified chronic kidney disease: Secondary | ICD-10-CM | POA: Diagnosis not present

## 2019-05-27 DIAGNOSIS — R0602 Shortness of breath: Secondary | ICD-10-CM | POA: Diagnosis not present

## 2019-05-27 DIAGNOSIS — N182 Chronic kidney disease, stage 2 (mild): Secondary | ICD-10-CM | POA: Diagnosis not present

## 2019-05-27 DIAGNOSIS — Z136 Encounter for screening for cardiovascular disorders: Secondary | ICD-10-CM | POA: Diagnosis not present

## 2019-05-27 DIAGNOSIS — K219 Gastro-esophageal reflux disease without esophagitis: Secondary | ICD-10-CM | POA: Diagnosis not present

## 2019-07-12 DIAGNOSIS — E782 Mixed hyperlipidemia: Secondary | ICD-10-CM | POA: Diagnosis not present

## 2019-07-12 DIAGNOSIS — N182 Chronic kidney disease, stage 2 (mild): Secondary | ICD-10-CM | POA: Diagnosis not present

## 2019-07-19 ENCOUNTER — Ambulatory Visit (INDEPENDENT_AMBULATORY_CARE_PROVIDER_SITE_OTHER): Payer: BC Managed Care – PPO | Admitting: Cardiovascular Disease

## 2019-07-19 ENCOUNTER — Other Ambulatory Visit: Payer: Self-pay

## 2019-07-19 ENCOUNTER — Telehealth: Payer: Self-pay | Admitting: Cardiovascular Disease

## 2019-07-19 ENCOUNTER — Encounter: Payer: Self-pay | Admitting: Cardiovascular Disease

## 2019-07-19 VITALS — BP 132/87 | HR 63 | Temp 97.7°F | Ht 70.0 in | Wt 249.0 lb

## 2019-07-19 DIAGNOSIS — I10 Essential (primary) hypertension: Secondary | ICD-10-CM | POA: Diagnosis not present

## 2019-07-19 DIAGNOSIS — Z9989 Dependence on other enabling machines and devices: Secondary | ICD-10-CM

## 2019-07-19 DIAGNOSIS — R0789 Other chest pain: Secondary | ICD-10-CM | POA: Diagnosis not present

## 2019-07-19 DIAGNOSIS — K219 Gastro-esophageal reflux disease without esophagitis: Secondary | ICD-10-CM | POA: Diagnosis not present

## 2019-07-19 DIAGNOSIS — I129 Hypertensive chronic kidney disease with stage 1 through stage 4 chronic kidney disease, or unspecified chronic kidney disease: Secondary | ICD-10-CM | POA: Diagnosis not present

## 2019-07-19 DIAGNOSIS — E782 Mixed hyperlipidemia: Secondary | ICD-10-CM | POA: Diagnosis not present

## 2019-07-19 DIAGNOSIS — N182 Chronic kidney disease, stage 2 (mild): Secondary | ICD-10-CM | POA: Diagnosis not present

## 2019-07-19 DIAGNOSIS — E785 Hyperlipidemia, unspecified: Secondary | ICD-10-CM

## 2019-07-19 DIAGNOSIS — G4733 Obstructive sleep apnea (adult) (pediatric): Secondary | ICD-10-CM

## 2019-07-19 DIAGNOSIS — R0602 Shortness of breath: Secondary | ICD-10-CM | POA: Diagnosis not present

## 2019-07-19 NOTE — Patient Instructions (Signed)
Medication Instructions: STOP Aspirin   Labwork: None today  Procedures/Testing: Your physician has requested that you have an echocardiogram. Echocardiography is a painless test that uses sound waves to create images of your heart. It provides your doctor with information about the size and shape of your heart and how well your heart's chambers and valves are working. This procedure takes approximately one hour. There are no restrictions for this procedure.  Your physician has requested that you have a lexiscan myoview. For further information please visit [email or web address]. Please follow instruction sheet, as given.    Follow-Up: 3 months with Dr.Koneswaran  Any Additional Special Instructions Will Be Listed Below (If Applicable).     If you need a refill on your cardiac medications before your next appointment, please call your pharmacy.        Thank you for choosing [facility] !

## 2019-07-19 NOTE — Telephone Encounter (Signed)
Virtual Visit Pre-Appointment Phone Call  "(Name), I am calling you today to discuss your upcoming appointment. We are currently trying to limit exposure to the virus that causes COVID-19 by seeing patients at home rather than in the office."  1. "What is the BEST phone number to call the day of the visit?" - include this in appointment notes  2. Do you have or have access to (through a family member/friend) a smartphone with video capability that we can use for your visit?" a. If yes - list this number in appt notes as cell (if different from BEST phone #) and list the appointment type as a VIDEO visit in appointment notes b. If no - list the appointment type as a PHONE visit in appointment notes  3. Confirm consent - "In the setting of the current Covid19 crisis, you are scheduled for a (phone or video) visit with your provider on (date) at (time).  Just as we do with many in-office visits, in order for you to participate in this visit, we must obtain consent.  If you'd like, I can send this to your mychart (if signed up) or email for you to review.  Otherwise, I can obtain your verbal consent now.  All virtual visits are billed to your insurance company just like a normal visit would be.  By agreeing to a virtual visit, we'd like you to understand that the technology does not allow for your provider to perform an examination, and thus may limit your provider's ability to fully assess your condition. If your provider identifies any concerns that need to be evaluated in person, we will make arrangements to do so.  Finally, though the technology is pretty good, we cannot assure that it will always work on either your or our end, and in the setting of a video visit, we may have to convert it to a phone-only visit.  In either situation, we cannot ensure that we have a secure connection.  Are you willing to proceed?" STAFF: Did the patient verbally acknowledge consent to telehealth visit? Document  YES/NO here: Yes  4. Advise patient to be prepared - "Two hours prior to your appointment, go ahead and check your blood pressure, pulse, oxygen saturation, and your weight (if you have the equipment to check those) and write them all down. When your visit starts, your provider will ask you for this information. If you have an Apple Watch or Kardia device, please plan to have heart rate information ready on the day of your appointment. Please have a pen and paper handy nearby the day of the visit as well."  5. Give patient instructions for MyChart download to smartphone OR [email or web address] as below if video visit (depending on what platform provider is using)  6. Inform patient they will receive a phone call 15 minutes prior to their appointment time (may be from unknown caller ID) so they should be prepared to answer    TELEPHONE CALL NOTE  Anthony Anderson has been deemed a candidate for a follow-up tele-health visit to limit community exposure during the Covid-19 pandemic. I spoke with the patient via phone to ensure availability of phone/video source, confirm preferred email & phone number, and discuss instructions and expectations.  I reminded Anthony Anderson to be prepared with any vital sign and/or heart rhythm information that could potentially be obtained via home monitoring, at the time of his visit. I reminded Anthony Anderson to expect a phone call prior to  his visit.  Terry L Goins 07/19/2019 9:00 AM

## 2019-07-19 NOTE — Progress Notes (Signed)
CARDIOLOGY CONSULT NOTE  Patient ID: Anthony Anderson MRN: [id number] DOB/AGE: August 08, 1961 58 y.o.  Admit date: (Not on file) Primary Physician: Celene Squibb, MD Referring Physician: Celene Squibb, MD.  Reason for Consultation: Chest discomfort  HPI: Anthony Anderson is a 58 y.o. male who is being seen today for the evaluation of chest discomfort at the request of Celene Squibb, MD.  Past medical history includes hypertension, sleep apnea, prostate cancer and hyperlipidemia.  He underwent cardiac catheterization on 10/26/2014 which demonstrated a 30% mid LAD stenosis.  I reviewed notes from his PCP.  Labs on 05/27/2019: Hemoglobin 17.1, platelets 109 9, BUN 10, creatinine 1.17, normal troponin.  He was started on aspirin and Toprol-XL by his PCP.   An ECG was performed on 05/27/2019 which I personally reviewed which demonstrated normal sinus rhythm with no significant ST segment or T wave abnormalities, nor any arrhythmias.  He has had episodic palpitations but they are seldom in occurrence.  He has been taking aspirin 81 mg twice daily and Toprol-XL 25 mg every evening.  He tries to work hard but feels somewhat exhausted by the end of the day.  He describes exertional dyspnea.  He denies orthopnea, leg swelling, and paroxysmal nocturnal dyspnea.   No Known Allergies  Current Outpatient Medications  Medication Sig Dispense Refill  . Artificial Tear Solution (SOOTHE XP OP) Place 1 drop into both eyes at bedtime as needed (for dry eyes).    Marland Kitchen losartan (COZAAR) 100 MG tablet Take 100 mg by mouth daily.    . Multiple Vitamin (MULTIVITAMIN WITH MINERALS) TABS tablet Take 1 tablet by mouth daily.    . pantoprazole (PROTONIX) 40 MG tablet Take 40 mg by mouth daily.    . rosuvastatin (CRESTOR) 10 MG tablet Take 10 mg by mouth daily.      No current facility-administered medications for this visit.     Past Medical History:  Diagnosis Date  . Cancer [facility])    prostate,  currently watching for any changes  . Cholelithiasis   . GERD (gastroesophageal reflux disease)   . History of hiatal hernia   . History of kidney stones   . Hyperlipidemia   . Hypertension   . Kidney stones   . PSA elevation   . Sleep apnea    uses CPAP    Past Surgical History:  Procedure Laterality Date  . LEFT HEART CATHETERIZATION WITH CORONARY ANGIOGRAM N/A 10/26/2014   Procedure: LEFT HEART CATHETERIZATION WITH CORONARY ANGIOGRAM;  Surgeon: Sinclair Grooms, MD;  Location: [facility] CATH LAB;  Service: Cardiovascular;  Laterality: N/A;  . LITHOTRIPSY    . MASS EXCISION Left 01/09/2018   Procedure: EXCISION SUBCUTANEOUS MASS, CHEST WALL;  Surgeon: Aviva Signs, MD;  Location: [facility] ORS;  Service: General;  Laterality: Left;  . NASAL SINUS SURGERY    . PELVIC LYMPH NODE DISSECTION Bilateral 09/11/2018   Procedure: PELVIC LYMPH NODE DISSECTION;  Surgeon: Ardis Hughs, MD;  Location: [facility] ORS;  Service: Urology;  Laterality: Bilateral;  . ROBOT ASSISTED LAPAROSCOPIC RADICAL PROSTATECTOMY N/A 09/11/2018   Procedure: XI ROBOTIC ASSISTED LAPAROSCOPIC RADICAL PROSTATECTOMY;  Surgeon: Ardis Hughs, MD;  Location: [facility] ORS;  Service: Urology;  Laterality: N/A;    Social History   Socioeconomic History  . Marital status: Married    Spouse name: Not on file  . Number of children: Not on file  . Years of education: Not on file  . Highest education  level: Not on file  Occupational History  . Not on file  Social Needs  . Financial resource strain: Not on file  . Food insecurity    Worry: Not on file    Inability: Not on file  . Transportation needs    Medical: Not on file    Non-medical: Not on file  Tobacco Use  . Smoking status: Former Smoker    Packs/day: 1.00    Years: 10.00    Pack years: 10.00    Types: Cigarettes    Quit date: 09/20/1982    Years since quitting: 36.8  . Smokeless tobacco: Never Used  Substance and Sexual Activity  . Alcohol use: No  . Drug use:  No  . Sexual activity: Not on file  Lifestyle  . Physical activity    Days per week: Not on file    Minutes per session: Not on file  . Stress: Not on file  Relationships  . Social Herbalist on phone: Not on file    Gets together: Not on file    Attends religious service: Not on file    Active member of club or organization: Not on file    Attends meetings of clubs or organizations: Not on file    Relationship status: Not on file  . Intimate partner violence    Fear of current or ex partner: Not on file    Emotionally abused: Not on file    Physically abused: Not on file    Forced sexual activity: Not on file  Other Topics Concern  . Not on file  Social History Narrative  . Not on file     No family history of premature CAD in 1st degree relatives.  Current Meds  Medication Sig  . Artificial Tear Solution (SOOTHE XP OP) Place 1 drop into both eyes at bedtime as needed (for dry eyes).  Marland Kitchen losartan (COZAAR) 100 MG tablet Take 100 mg by mouth daily.  . Multiple Vitamin (MULTIVITAMIN WITH MINERALS) TABS tablet Take 1 tablet by mouth daily.  . pantoprazole (PROTONIX) 40 MG tablet Take 40 mg by mouth daily.  . rosuvastatin (CRESTOR) 10 MG tablet Take 10 mg by mouth daily.       Review of systems complete and found to be negative unless listed above in HPI    Physical exam Blood pressure 132/87, pulse 63, temperature 97.7 F (36.5 C), height 5\' 10"  (1.778 m), weight 249 lb (112.9 kg). General: NAD Neck: No JVD, no thyromegaly or thyroid nodule.  Lungs: Clear to auscultation bilaterally with normal respiratory effort. CV: Nondisplaced PMI. Regular rate and rhythm, normal S1/S2, no S3/S4, no murmur.  No peripheral edema.  No carotid bruit.    Abdomen: Soft, nontender, no distention.  Skin: Intact without lesions or rashes.  Neurologic: Alert and oriented x 3.  Psych: Normal affect. Extremities: No clubbing or cyanosis.  HEENT: Normal.   ECG: Most recent ECG  reviewed.   Labs: Lab Results  Component Value Date/Time   K 4.2 09/12/2018 05:13 AM   BUN 20 09/12/2018 05:13 AM   CREATININE 1.53 (H) 09/12/2018 05:13 AM   ALT 30 09/07/2018 12:30 PM   TSH 2.288 10/24/2014 05:27 AM   HGB 13.9 09/12/2018 05:13 AM     Lipids: No results found for: LDLCALC, LDLDIRECT, CHOL, TRIG, HDL      ASSESSMENT AND PLAN:  1.  Chest pain and shortness of breath: 30% mid LAD lesion by cardiac catheterization in 2015.  I will proceed with a nuclear myocardial perfusion imaging study to evaluate for ischemic heart disease (Lexiscan Myoview). I will order a 2-D echocardiogram with Doppler to evaluate cardiac structure, function, and regional wall motion. He has been taking aspirin 81 mg twice daily which I told him to discontinue.  He is on Toprol-XL 25 mg every evening.  2.  Hypertension: Blood pressure is normal.  No changes to therapy.  He is on losartan and Toprol-XL.  3.  Obstructive sleep apnea: On CPAP.  4.  Hypercholesterolemia: I will obtain a copy of lipids from PCP.  Currently on rosuvastatin 10 mg.    Disposition: Follow up in 3 months  Signed: Kate Sable, M.D., F.A.C.C.  07/19/2019, 8:29 AM

## 2019-07-26 DIAGNOSIS — C61 Malignant neoplasm of prostate: Secondary | ICD-10-CM | POA: Diagnosis not present

## 2019-08-02 DIAGNOSIS — C61 Malignant neoplasm of prostate: Secondary | ICD-10-CM | POA: Diagnosis not present

## 2019-08-02 DIAGNOSIS — B9620 Unspecified Escherichia coli [E. coli] as the cause of diseases classified elsewhere: Secondary | ICD-10-CM | POA: Diagnosis not present

## 2019-08-02 DIAGNOSIS — N3000 Acute cystitis without hematuria: Secondary | ICD-10-CM | POA: Diagnosis not present

## 2019-08-02 DIAGNOSIS — N390 Urinary tract infection, site not specified: Secondary | ICD-10-CM | POA: Diagnosis not present

## 2019-08-02 DIAGNOSIS — N5201 Erectile dysfunction due to arterial insufficiency: Secondary | ICD-10-CM | POA: Diagnosis not present

## 2019-08-09 ENCOUNTER — Encounter (HOSPITAL_COMMUNITY)
Admission: RE | Admit: 2019-08-09 | Discharge: 2019-08-09 | Disposition: A | Discharge status: Home / Self Care | Payer: BC Managed Care – PPO | Source: Ambulatory Visit | Attending: Cardiovascular Disease | Admitting: Cardiovascular Disease

## 2019-08-09 ENCOUNTER — Encounter (HOSPITAL_BASED_OUTPATIENT_CLINIC_OR_DEPARTMENT_OTHER)
Admission: RE | Admit: 2019-08-09 | Discharge: 2019-08-09 | Disposition: A | Discharge status: Home / Self Care | Payer: BC Managed Care – PPO | Source: Ambulatory Visit | Attending: Cardiovascular Disease | Admitting: Cardiovascular Disease

## 2019-08-09 ENCOUNTER — Ambulatory Visit (HOSPITAL_COMMUNITY)
Admission: RE | Admit: 2019-08-09 | Discharge: 2019-08-09 | Disposition: A | Discharge status: Home / Self Care | Payer: BC Managed Care – PPO | Source: Ambulatory Visit | Attending: Cardiovascular Disease | Admitting: Cardiovascular Disease

## 2019-08-09 ENCOUNTER — Other Ambulatory Visit: Payer: Self-pay

## 2019-08-09 DIAGNOSIS — R0602 Shortness of breath: Secondary | ICD-10-CM

## 2019-08-09 DIAGNOSIS — R0789 Other chest pain: Secondary | ICD-10-CM

## 2019-08-09 LAB — NM MYOCAR MULTI W/SPECT W/WALL MOTION / EF
LV dias vol: 121 mL (ref 62–150)
LV sys vol: 45 mL
Peak HR: 96 {beats}/min
RATE: 0.46
Rest HR: 63 {beats}/min
SDS: 3
SRS: 0
SSS: 3
TID: 1.16

## 2019-08-09 MED ORDER — SODIUM CHLORIDE 0.9% FLUSH
INTRAVENOUS | Status: AC
  Administered 2019-08-09: 10 mL via INTRAVENOUS
  Filled 2019-08-09: qty 10

## 2019-08-09 MED ORDER — TECHNETIUM TC 99M TETROFOSMIN IV KIT
30.0000 | PACK | Freq: Once | INTRAVENOUS | Status: AC | PRN
  Administered 2019-08-09: 32 via INTRAVENOUS

## 2019-08-09 MED ORDER — TECHNETIUM TC 99M TETROFOSMIN IV KIT
10.0000 | PACK | Freq: Once | INTRAVENOUS | Status: AC | PRN
  Administered 2019-08-09: 10.1 via INTRAVENOUS

## 2019-08-09 MED ORDER — REGADENOSON 0.4 MG/5ML IV SOLN
INTRAVENOUS | Status: AC
  Administered 2019-08-09: 0.4 mg via INTRAVENOUS
  Filled 2019-08-09: qty 5

## 2019-08-09 NOTE — Progress Notes (Signed)
  Echocardiogram 2D Echocardiogram has been performed.  Anthony Anderson 08/09/2019, 11:37 AM

## 2019-08-12 ENCOUNTER — Telehealth: Payer: Self-pay | Admitting: *Deleted

## 2019-08-12 NOTE — Telephone Encounter (Signed)
Called patient with test results. No answer. Left message to call back.  

## 2019-08-12 NOTE — Telephone Encounter (Signed)
-----   Message from Herminio Commons, MD sent at 08/09/2019  1:16 PM EDT ----- Normal pumping function.

## 2019-09-09 DIAGNOSIS — G4733 Obstructive sleep apnea (adult) (pediatric): Secondary | ICD-10-CM | POA: Diagnosis not present

## 2019-09-17 DIAGNOSIS — R0981 Nasal congestion: Secondary | ICD-10-CM | POA: Diagnosis not present

## 2019-09-17 DIAGNOSIS — R509 Fever, unspecified: Secondary | ICD-10-CM | POA: Diagnosis not present

## 2019-09-17 DIAGNOSIS — R0602 Shortness of breath: Secondary | ICD-10-CM | POA: Diagnosis not present

## 2019-10-04 ENCOUNTER — Telehealth: Payer: BC Managed Care – PPO | Admitting: Cardiovascular Disease

## 2020-01-31 DIAGNOSIS — C61 Malignant neoplasm of prostate: Secondary | ICD-10-CM | POA: Diagnosis not present

## 2020-02-06 ENCOUNTER — Emergency Department (HOSPITAL_COMMUNITY)
Admission: EM | Admit: 2020-02-06 | Discharge: 2020-02-06 | Disposition: A | Discharge status: Home / Self Care | Payer: BC Managed Care – PPO | Attending: Emergency Medicine | Admitting: Emergency Medicine

## 2020-02-06 ENCOUNTER — Emergency Department (HOSPITAL_COMMUNITY): Payer: BC Managed Care – PPO

## 2020-02-06 ENCOUNTER — Other Ambulatory Visit: Payer: Self-pay

## 2020-02-06 ENCOUNTER — Encounter (HOSPITAL_COMMUNITY): Payer: Self-pay | Admitting: Emergency Medicine

## 2020-02-06 DIAGNOSIS — I10 Essential (primary) hypertension: Secondary | ICD-10-CM | POA: Diagnosis not present

## 2020-02-06 DIAGNOSIS — Z87891 Personal history of nicotine dependence: Secondary | ICD-10-CM | POA: Insufficient documentation

## 2020-02-06 DIAGNOSIS — K8020 Calculus of gallbladder without cholecystitis without obstruction: Secondary | ICD-10-CM | POA: Diagnosis not present

## 2020-02-06 DIAGNOSIS — N201 Calculus of ureter: Secondary | ICD-10-CM

## 2020-02-06 DIAGNOSIS — N390 Urinary tract infection, site not specified: Secondary | ICD-10-CM | POA: Diagnosis not present

## 2020-02-06 DIAGNOSIS — Z79899 Other long term (current) drug therapy: Secondary | ICD-10-CM | POA: Diagnosis not present

## 2020-02-06 DIAGNOSIS — R1031 Right lower quadrant pain: Secondary | ICD-10-CM | POA: Diagnosis not present

## 2020-02-06 DIAGNOSIS — N132 Hydronephrosis with renal and ureteral calculous obstruction: Secondary | ICD-10-CM | POA: Diagnosis not present

## 2020-02-06 LAB — CBC WITH DIFFERENTIAL/PLATELET
Abs Immature Granulocytes: 0.04 10*3/uL (ref 0.00–0.07)
Basophils Absolute: 0.1 10*3/uL (ref 0.0–0.1)
Basophils Relative: 1 %
Eosinophils Absolute: 0.1 10*3/uL (ref 0.0–0.5)
Eosinophils Relative: 1 %
HCT: 51 % (ref 39.0–52.0)
Hemoglobin: 16.8 g/dL (ref 13.0–17.0)
Immature Granulocytes: 0 %
Lymphocytes Relative: 5 %
Lymphs Abs: 0.6 10*3/uL — ABNORMAL LOW (ref 0.7–4.0)
MCH: 28.9 pg (ref 26.0–34.0)
MCHC: 32.9 g/dL (ref 30.0–36.0)
MCV: 87.8 fL (ref 80.0–100.0)
Monocytes Absolute: 0.7 10*3/uL (ref 0.1–1.0)
Monocytes Relative: 5 %
Neutro Abs: 11.8 10*3/uL — ABNORMAL HIGH (ref 1.7–7.7)
Neutrophils Relative %: 88 %
Platelets: 215 10*3/uL (ref 150–400)
RBC: 5.81 MIL/uL (ref 4.22–5.81)
RDW: 12.8 % (ref 11.5–15.5)
WBC: 13.3 10*3/uL — ABNORMAL HIGH (ref 4.0–10.5)
nRBC: 0 % (ref 0.0–0.2)

## 2020-02-06 LAB — BASIC METABOLIC PANEL
Anion gap: 10 (ref 5–15)
BUN: 21 mg/dL — ABNORMAL HIGH (ref 6–20)
CO2: 23 mmol/L (ref 22–32)
Calcium: 9.5 mg/dL (ref 8.9–10.3)
Chloride: 108 mmol/L (ref 98–111)
Creatinine, Ser: 1.57 mg/dL — ABNORMAL HIGH (ref 0.61–1.24)
GFR calc Af Amer: 55 mL/min — ABNORMAL LOW (ref >60)
GFR calc non Af Amer: 48 mL/min — ABNORMAL LOW (ref >60)
Glucose, Bld: 132 mg/dL — ABNORMAL HIGH (ref 70–99)
Potassium: 4.3 mmol/L (ref 3.5–5.1)
Sodium: 141 mmol/L (ref 135–145)

## 2020-02-06 LAB — URINALYSIS, ROUTINE W REFLEX MICROSCOPIC
Bilirubin Urine: NEGATIVE
Glucose, UA: NEGATIVE mg/dL
Ketones, ur: NEGATIVE mg/dL
Nitrite: POSITIVE — AB
Protein, ur: 30 mg/dL — AB
Specific Gravity, Urine: 1.021 (ref 1.005–1.030)
WBC, UA: >50 WBC/hpf — ABNORMAL HIGH (ref 0–5)
pH: 5 (ref 5.0–8.0)

## 2020-02-06 MED ORDER — ONDANSETRON HCL 4 MG PO TABS: 4.0000 mg | ORAL_TABLET | Freq: Four times a day (QID) | ORAL | 0 refills | Status: DC

## 2020-02-06 MED ORDER — CEFTRIAXONE SODIUM 1 G IJ SOLR
1.0000 g | Freq: Once | INTRAMUSCULAR | Status: DC
  Filled 2020-02-06: qty 10

## 2020-02-06 MED ORDER — OXYCODONE-ACETAMINOPHEN 5-325 MG PO TABS: 1.0000 | ORAL_TABLET | ORAL | 0 refills | Status: DC | PRN

## 2020-02-06 MED ORDER — HYDROMORPHONE HCL 1 MG/ML IJ SOLN
1.0000 mg | Freq: Once | INTRAMUSCULAR | Status: AC
  Administered 2020-02-06: 1 mg via INTRAVENOUS
  Filled 2020-02-06: qty 1

## 2020-02-06 MED ORDER — ONDANSETRON HCL 4 MG/2ML IJ SOLN
4.0000 mg | Freq: Once | INTRAMUSCULAR | Status: AC
  Administered 2020-02-06: 4 mg via INTRAVENOUS
  Filled 2020-02-06: qty 2

## 2020-02-06 MED ORDER — SODIUM CHLORIDE 0.9 % IV SOLN
1.0000 g | Freq: Once | INTRAVENOUS | Status: AC
  Administered 2020-02-06: 1 g via INTRAVENOUS
  Filled 2020-02-06: qty 10

## 2020-02-06 MED ORDER — LIDOCAINE HCL (PF) 2 % IJ SOLN
INTRAMUSCULAR | Status: AC
  Filled 2020-02-06: qty 10

## 2020-02-06 MED ORDER — CEPHALEXIN 500 MG PO CAPS: 500.0000 mg | ORAL_CAPSULE | Freq: Four times a day (QID) | ORAL | 0 refills | Status: DC

## 2020-02-06 MED ORDER — ONDANSETRON HCL 4 MG PO TABS: 4.0000 mg | ORAL_TABLET | Freq: Three times a day (TID) | ORAL | 0 refills | Status: DC | PRN

## 2020-02-06 MED ORDER — METOCLOPRAMIDE HCL 5 MG/ML IJ SOLN
10.0000 mg | Freq: Once | INTRAMUSCULAR | Status: AC
  Administered 2020-02-06: 10 mg via INTRAVENOUS
  Filled 2020-02-06: qty 2

## 2020-02-06 NOTE — Discharge Instructions (Addendum)
As discussed, your CT this evening shows that you have a large right-sided kidney stone and your urine test shows a likely lower urinary tract infection.  You will need to take the antibiotics as directed and keep your appointment with Dr. Diona Fanti for tomorrow.  Return to the emergency department for any worsening symptoms.

## 2020-02-06 NOTE — ED Provider Notes (Signed)
[facility] Provider Note   CSN: [id number] Arrival date & time: 02/06/20  1516     History Chief Complaint  Patient presents with  . Flank Pain    Anthony Anderson is a 59 y.o. male.  HPI      Anthony Anderson is a 59 y.o. male with past medical history of previous prostate cancer with prostatectomy, hypertension, cholelithiasis and kidney stones who presents to the Emergency Department complaining of sudden onset of right flank pain earlier today.  He states the pain is sharp and radiating to his right groin.  Pain is associated with some urinary hesitancy, urgency, nausea and vomiting.  Current symptoms feel similar to previous kidney stones.  He also describes a milder pain to the right flank area.  No known injury.  He denies fever, chills, chest pain, shortness of breath and hematuria.  He states that he has an appointment with his urologist, Dr. Diona Fanti tomorrow.    Past Medical History:  Diagnosis Date  . Cancer [facility])    prostate, currently watching for any changes  . Cholelithiasis   . GERD (gastroesophageal reflux disease)   . History of hiatal hernia   . History of kidney stones   . Hyperlipidemia   . Hypertension   . Kidney stones   . PSA elevation   . Sleep apnea    uses CPAP    Patient Active Problem List   Diagnosis Date Noted  . Prostate cancer ([place]) 09/11/2018  . Lipoma of torso   . Essential hypertension 10/26/2014  . Hyperlipidemia 10/26/2014  . GERD (gastroesophageal reflux disease) 10/26/2014  . Chest pain   . Shortness of breath 10/24/2014  . Near syncope 10/24/2014  . Dyspnea 10/24/2014    Past Surgical History:  Procedure Laterality Date  . LEFT HEART CATHETERIZATION WITH CORONARY ANGIOGRAM N/A 10/26/2014   Procedure: LEFT HEART CATHETERIZATION WITH CORONARY ANGIOGRAM;  Surgeon: Sinclair Grooms, MD;  Location: [facility] CATH LAB;  Service: Cardiovascular;  Laterality: N/A;  . LITHOTRIPSY    . MASS EXCISION Left 01/09/2018    Procedure: EXCISION SUBCUTANEOUS MASS, CHEST WALL;  Surgeon: Aviva Signs, MD;  Location: [facility] ORS;  Service: General;  Laterality: Left;  . NASAL SINUS SURGERY    . PELVIC LYMPH NODE DISSECTION Bilateral 09/11/2018   Procedure: PELVIC LYMPH NODE DISSECTION;  Surgeon: Ardis Hughs, MD;  Location: [facility] ORS;  Service: Urology;  Laterality: Bilateral;  . ROBOT ASSISTED LAPAROSCOPIC RADICAL PROSTATECTOMY N/A 09/11/2018   Procedure: XI ROBOTIC ASSISTED LAPAROSCOPIC RADICAL PROSTATECTOMY;  Surgeon: Ardis Hughs, MD;  Location: [facility] ORS;  Service: Urology;  Laterality: N/A;       Family History  Problem Relation Age of Onset  . CAD Mother   . CAD Father   . Prostate cancer Father     Social History   Tobacco Use  . Smoking status: Former Smoker    Packs/day: 1.00    Years: 10.00    Pack years: 10.00    Types: Cigarettes    Quit date: 09/20/1982    Years since quitting: 37.4  . Smokeless tobacco: Never Used  Substance Use Topics  . Alcohol use: No  . Drug use: No    Home Medications Prior to Admission medications   Medication Sig Start Date End Date Taking? Authorizing Provider  albuterol (VENTOLIN HFA) 108 (90 Base) MCG/ACT inhaler Inhale 1-2 puffs into the lungs every 6 (six) hours as needed for wheezing or shortness of breath.  09/30/19  Yes [provider]  Artificial Tear Solution (SOOTHE XP OP) Place 1 drop into both eyes at bedtime as needed (for dry eyes).   Yes [provider]  aspirin EC 81 MG tablet Take 81 mg by mouth every other day.   Yes [provider]  losartan (COZAAR) 100 MG tablet Take 100 mg by mouth daily.   Yes [provider]  metoprolol succinate (TOPROL-XL) 25 MG 24 hr tablet Take 25 mg by mouth at bedtime.  06/28/19  Yes [provider]  Multiple Vitamin (MULTIVITAMIN WITH MINERALS) TABS tablet Take 1 tablet by mouth daily.   Yes [provider]  olmesartan (BENICAR) 40 MG tablet Take 40 mg by  mouth daily. 11/29/19  Yes [provider]  pantoprazole (PROTONIX) 40 MG tablet Take 40 mg by mouth daily.   Yes [provider]  rosuvastatin (CRESTOR) 10 MG tablet Take 10 mg by mouth daily.    Yes [provider]    Allergies    Patient has no known allergies.  Review of Systems   Review of Systems  Constitutional: Negative for activity change, appetite change, chills and fever.  Respiratory: Negative for chest tightness and shortness of breath.   Cardiovascular: Negative for chest pain.  Gastrointestinal: Positive for nausea and vomiting. Negative for abdominal pain.  Genitourinary: Positive for dysuria, flank pain and urgency. Negative for decreased urine volume, difficulty urinating, frequency, hematuria, penile swelling, scrotal swelling and testicular pain.  Musculoskeletal: Negative for back pain.  Skin: Negative for rash.  Neurological: Negative for dizziness, weakness and numbness.  Hematological: Negative for adenopathy.    Physical Exam Updated Vital Signs BP (!) 167/103 (BP Location: Right Arm)   Pulse 72   Temp 98.3 F (36.8 C) (Oral)   Resp 20   Ht 5\' 10"  (1.778 m)   Wt 113.4 kg   SpO2 99%   BMI 35.87 kg/m   Physical Exam Vitals and nursing note reviewed.  Constitutional:      General: He is not in acute distress.    Appearance: He is not ill-appearing or toxic-appearing.     Comments: Patient is uncomfortable appearing.  HENT:     Mouth/Throat:     Mouth: Mucous membranes are moist.  Cardiovascular:     Rate and Rhythm: Normal rate and regular rhythm.     Pulses: Normal pulses.  Pulmonary:     Effort: Pulmonary effort is normal.     Breath sounds: Normal breath sounds.  Chest:     Chest wall: No tenderness.  Abdominal:     General: There is no distension.     Palpations: Abdomen is soft.     Tenderness: There is no abdominal tenderness. There is no right CVA tenderness or left CVA tenderness.  Musculoskeletal:         General: Normal range of motion.     Cervical back: Normal range of motion.  Skin:    General: Skin is warm.     Capillary Refill: Capillary refill takes less than 2 seconds.     Findings: No rash.  Neurological:     General: No focal deficit present.     Mental Status: He is alert.     ED Results / Procedures / Treatments   Labs (all labs ordered are listed, but only abnormal results are displayed) Labs Reviewed  URINALYSIS, ROUTINE W REFLEX MICROSCOPIC - Abnormal; Notable for the following components:      Result Value  APPearance HAZY (*)    Hgb urine dipstick MODERATE (*)    Protein, ur 30 (*)    Nitrite POSITIVE (*)    Leukocytes,Ua LARGE (*)    WBC, UA >50 (*)    Bacteria, UA MANY (*)    All other components within normal limits  CBC WITH DIFFERENTIAL/PLATELET - Abnormal; Notable for the following components:   WBC 13.3 (*)    Neutro Abs 11.8 (*)    Lymphs Abs 0.6 (*)    All other components within normal limits  BASIC METABOLIC PANEL - Abnormal; Notable for the following components:   Glucose, Bld 132 (*)    BUN 21 (*)    Creatinine, Ser 1.57 (*)    GFR calc non Af Amer 48 (*)    GFR calc Af Amer 55 (*)    All other components within normal limits  URINE CULTURE    EKG None  Radiology CT Renal Stone Study  Result Date: 02/06/2020 CLINICAL DATA:  59 year old male with flank pain. Concern for kidney stone. EXAM: CT ABDOMEN AND PELVIS WITHOUT CONTRAST TECHNIQUE: Multidetector CT imaging of the abdomen and pelvis was performed following the standard protocol without IV contrast. COMPARISON:  None. FINDINGS: Evaluation of this exam is limited in the absence of intravenous contrast. Lower chest: Bibasilar linear atelectasis/scarring. The visualized lung bases are otherwise clear. No intra-abdominal free air or free fluid. Hepatobiliary: Diffuse fatty infiltration of the liver. No intrahepatic biliary ductal dilatation. There are several stones in the gallbladder. No  pericholecystic fluid. Pancreas: Unremarkable. No pancreatic ductal dilatation or surrounding inflammatory changes. Spleen: Faint small hypodense focus within the spleen is not well characterized but may represent a cyst or hemangioma. The spleen is otherwise unremarkable. Adrenals/Urinary Tract: The adrenal glands are unremarkable. There is a 12 mm stone in the right renal pelvis obstructing the ureteropelvic junction with mild right hydronephrosis. Probable tiny additional nonobstructing right renal calculi are present. Layering appearing 4 mm stone in the interpolar renal parenchyma may represent parenchymal calcification or layering stone or milk of calcium in a dilated calyx. Small left renal parenchymal calcifications noted. There is a 3.5 cm left renal upper pole partially exophytic hypodense lesion which is not characterized but demonstrates fluid attenuation, likely a cyst. There is no hydronephrosis or obstructing stone on the left. The urinary bladder is collapsed. Stomach/Bowel: There is a moderate size hiatal hernia. There is sigmoid diverticulosis without active inflammatory changes. There is no bowel obstruction or active inflammation. The appendix is normal. Vascular/Lymphatic: The abdominal aorta and IVC are unremarkable on this noncontrast CT. No portal venous gas. There is mild haziness of the mesentery with multiple mildly enlarged lymph nodes with a "misty mesentery" appearance. This finding is nonspecific but may be related to underlying inflammatory/infectious etiology. Reproductive: Radical prostatectomy. Other: There is a 4.5 x 6 cm fluid attenuating structure along the left lateral pelvic wall which is not characterized on this noncontrast CT but favored to represent a lymphocele. If there is clinical concern for malignancy MRI may provide better characterization on a non emergent/outpatient basis. Musculoskeletal: No acute or significant osseous findings. IMPRESSION: 1. A 12 mm right UPJ  stone with mild right hydronephrosis. Additional small nonobstructing right renal calculi noted. 2. Fatty liver. 3. Cholelithiasis. 4. Sigmoid diverticulosis. No bowel obstruction. Normal appendix. 5. A 4.5 x 6 cm fluid attenuating structure along the left lateral pelvic wall favored to represent a lymphocele. 6. Moderate size hiatal hernia. Electronically Signed   By: Anner Crete  M.D.   On: 02/06/2020 18:51    Procedures Procedures (including critical care time)  Medications Ordered in ED Medications  cefTRIAXone (ROCEPHIN) injection 1 g (has no administration in time range)  HYDROmorphone (DILAUDID) injection 1 mg (1 mg Intravenous Given 02/06/20 1702)  ondansetron (ZOFRAN) injection 4 mg (4 mg Intravenous Given 02/06/20 1702)  HYDROmorphone (DILAUDID) injection 1 mg (1 mg Intravenous Given 02/06/20 1920)  metoCLOPramide (REGLAN) injection 10 mg (10 mg Intravenous Given 02/06/20 2044)    ED Course  I have reviewed the triage vital signs and the nursing notes.  Pertinent labs & imaging results that were available during my care of the patient were reviewed by me and considered in my medical decision making (see chart for details).    MDM Rules/Calculators/A&P                     Patient with history of kidney stones, presents to the ER with sudden onset of right flank pain radiating to his groin.  He states the pain is similar to previous kidney stone pain.  Labs obtained, along with CT renal stone study that shows a 12 mm stone at the right UPJ with mild hydronephrosis.  He does have an elevated serum creatinine which appears chronic, so I have refrain from giving Toradol.  Currently, pain is controlled after receiving IV Dilaudid.  Nausea has subsided after receiving ondansetron.   [place] u[facility], Dr. Junious Silk and discussed findings.  U/A was still pending at time of our initial discussion.  He felt if patient wasn't septic he could likely be d/c home to see Dr Diona Fanti tomorrow as  scheduled.  After review of patient's urinalysis, I consulted Dr. Junious Silk again and discussed urinalysis results..  Pt is well-appearing and nontoxic.  No concern for sepsis. Likely infection of the lower urinary tract and I have given IV rocephin here.   He recommends discharge home on Keflex and urine culture is pending.  Patient agrees to plan and keep his appointment with urology tomorrow.    Final Clinical Impression(s) / ED Diagnoses Final diagnoses:  Right ureteral stone  Lower urinary tract infectious disease    Rx / DC Orders ED Discharge Orders    None       Kem Parkinson, PA-C 02/07/20 0058    LongWonda Olds, MD 02/07/20 1944

## 2020-02-06 NOTE — ED Notes (Signed)
Pt aware we need urine sample, urinal at bedside.  

## 2020-02-06 NOTE — ED Triage Notes (Signed)
Hx kidney stone   Prostate Ca  Sudden onset of R flank pain with urgency   Here for eval of stone

## 2020-02-07 ENCOUNTER — Observation Stay (HOSPITAL_COMMUNITY)
Admission: RE | Admit: 2020-02-07 | Discharge: 2020-02-08 | Disposition: A | Discharge status: Home / Self Care | Payer: BC Managed Care – PPO | Source: Other Acute Inpatient Hospital | Attending: Urology | Admitting: Urology

## 2020-02-07 ENCOUNTER — Encounter (HOSPITAL_COMMUNITY): Payer: Self-pay | Admitting: Urology

## 2020-02-07 ENCOUNTER — Encounter (HOSPITAL_COMMUNITY)
Admission: RE | Disposition: A | Discharge status: Home / Self Care | Payer: Self-pay | Source: Other Acute Inpatient Hospital | Attending: Urology

## 2020-02-07 ENCOUNTER — Other Ambulatory Visit: Payer: Self-pay

## 2020-02-07 ENCOUNTER — Ambulatory Visit (HOSPITAL_COMMUNITY): Payer: BC Managed Care – PPO | Admitting: Anesthesiology

## 2020-02-07 ENCOUNTER — Observation Stay (HOSPITAL_COMMUNITY): Payer: BC Managed Care – PPO

## 2020-02-07 ENCOUNTER — Other Ambulatory Visit: Payer: Self-pay | Admitting: Urology

## 2020-02-07 DIAGNOSIS — R6883 Chills (without fever): Secondary | ICD-10-CM | POA: Diagnosis not present

## 2020-02-07 DIAGNOSIS — E785 Hyperlipidemia, unspecified: Secondary | ICD-10-CM | POA: Insufficient documentation

## 2020-02-07 DIAGNOSIS — N201 Calculus of ureter: Secondary | ICD-10-CM | POA: Diagnosis not present

## 2020-02-07 DIAGNOSIS — K219 Gastro-esophageal reflux disease without esophagitis: Secondary | ICD-10-CM | POA: Insufficient documentation

## 2020-02-07 DIAGNOSIS — J449 Chronic obstructive pulmonary disease, unspecified: Secondary | ICD-10-CM | POA: Diagnosis not present

## 2020-02-07 DIAGNOSIS — N132 Hydronephrosis with renal and ureteral calculous obstruction: Principal | ICD-10-CM | POA: Insufficient documentation

## 2020-02-07 DIAGNOSIS — E669 Obesity, unspecified: Secondary | ICD-10-CM | POA: Diagnosis not present

## 2020-02-07 DIAGNOSIS — Z8546 Personal history of malignant neoplasm of prostate: Secondary | ICD-10-CM | POA: Insufficient documentation

## 2020-02-07 DIAGNOSIS — N200 Calculus of kidney: Secondary | ICD-10-CM | POA: Diagnosis not present

## 2020-02-07 DIAGNOSIS — K449 Diaphragmatic hernia without obstruction or gangrene: Secondary | ICD-10-CM | POA: Insufficient documentation

## 2020-02-07 DIAGNOSIS — I10 Essential (primary) hypertension: Secondary | ICD-10-CM | POA: Insufficient documentation

## 2020-02-07 DIAGNOSIS — N390 Urinary tract infection, site not specified: Secondary | ICD-10-CM | POA: Insufficient documentation

## 2020-02-07 DIAGNOSIS — Z87442 Personal history of urinary calculi: Secondary | ICD-10-CM | POA: Diagnosis not present

## 2020-02-07 DIAGNOSIS — Z6835 Body mass index (BMI) 35.0-35.9, adult: Secondary | ICD-10-CM | POA: Insufficient documentation

## 2020-02-07 DIAGNOSIS — Z20822 Contact with and (suspected) exposure to covid-19: Secondary | ICD-10-CM | POA: Diagnosis not present

## 2020-02-07 DIAGNOSIS — G4730 Sleep apnea, unspecified: Secondary | ICD-10-CM | POA: Diagnosis not present

## 2020-02-07 DIAGNOSIS — Z79899 Other long term (current) drug therapy: Secondary | ICD-10-CM | POA: Diagnosis not present

## 2020-02-07 DIAGNOSIS — Z7982 Long term (current) use of aspirin: Secondary | ICD-10-CM | POA: Diagnosis not present

## 2020-02-07 DIAGNOSIS — N289 Disorder of kidney and ureter, unspecified: Secondary | ICD-10-CM | POA: Diagnosis not present

## 2020-02-07 DIAGNOSIS — C61 Malignant neoplasm of prostate: Secondary | ICD-10-CM | POA: Diagnosis not present

## 2020-02-07 HISTORY — PX: CYSTOSCOPY WITH STENT PLACEMENT: SHX5790

## 2020-02-07 LAB — CBC
HCT: 47 % (ref 39.0–52.0)
Hemoglobin: 15.8 g/dL (ref 13.0–17.0)
MCH: 29.6 pg (ref 26.0–34.0)
MCHC: 33.6 g/dL (ref 30.0–36.0)
MCV: 88 fL (ref 80.0–100.0)
Platelets: 159 10*3/uL (ref 150–400)
RBC: 5.34 MIL/uL (ref 4.22–5.81)
RDW: 12.7 % (ref 11.5–15.5)
WBC: 11.3 10*3/uL — ABNORMAL HIGH (ref 4.0–10.5)
nRBC: 0 % (ref 0.0–0.2)

## 2020-02-07 LAB — BASIC METABOLIC PANEL
Anion gap: 13 (ref 5–15)
BUN: 26 mg/dL — ABNORMAL HIGH (ref 6–20)
CO2: 23 mmol/L (ref 22–32)
Calcium: 9.1 mg/dL (ref 8.9–10.3)
Chloride: 104 mmol/L (ref 98–111)
Creatinine, Ser: 2.12 mg/dL — ABNORMAL HIGH (ref 0.61–1.24)
GFR calc Af Amer: 38 mL/min — ABNORMAL LOW (ref >60)
GFR calc non Af Amer: 33 mL/min — ABNORMAL LOW (ref >60)
Glucose, Bld: 118 mg/dL — ABNORMAL HIGH (ref 70–99)
Potassium: 4.1 mmol/L (ref 3.5–5.1)
Sodium: 140 mmol/L (ref 135–145)

## 2020-02-07 LAB — RESPIRATORY PANEL BY RT PCR (FLU A&B, COVID)
Influenza A by PCR: NEGATIVE
Influenza B by PCR: NEGATIVE
SARS Coronavirus 2 by RT PCR: NEGATIVE

## 2020-02-07 SURGERY — CYSTOSCOPY, WITH STENT INSERTION
Anesthesia: General | Laterality: Right

## 2020-02-07 MED ORDER — MIDAZOLAM HCL 2 MG/2ML IJ SOLN
INTRAMUSCULAR | Status: AC
  Filled 2020-02-07: qty 2

## 2020-02-07 MED ORDER — ONDANSETRON HCL 4 MG/2ML IJ SOLN
INTRAMUSCULAR | Status: AC
  Filled 2020-02-07: qty 2

## 2020-02-07 MED ORDER — PHENYLEPHRINE HCL (PRESSORS) 10 MG/ML IV SOLN
INTRAVENOUS | Status: AC
  Filled 2020-02-07: qty 1

## 2020-02-07 MED ORDER — PROPOFOL 10 MG/ML IV BOLUS
INTRAVENOUS | Status: DC | PRN
  Administered 2020-02-07: 150 mg via INTRAVENOUS

## 2020-02-07 MED ORDER — SCOPOLAMINE 1 MG/3DAYS TD PT72
1.0000 | MEDICATED_PATCH | Freq: Once | TRANSDERMAL | Status: DC
  Administered 2020-02-07: 17:00:00 1.5 mg via TRANSDERMAL
  Filled 2020-02-07: qty 1

## 2020-02-07 MED ORDER — FENTANYL CITRATE (PF) 100 MCG/2ML IJ SOLN
INTRAMUSCULAR | Status: AC
  Filled 2020-02-07: qty 2

## 2020-02-07 MED ORDER — SODIUM CHLORIDE 0.9 % IV SOLN
1.0000 g | INTRAVENOUS | Status: DC
  Filled 2020-02-07: qty 10

## 2020-02-07 MED ORDER — STERILE WATER FOR IRRIGATION IR SOLN
Status: DC | PRN
  Administered 2020-02-07: 3000 mL

## 2020-02-07 MED ORDER — LIDOCAINE 2% (20 MG/ML) 5 ML SYRINGE
INTRAMUSCULAR | Status: AC
  Filled 2020-02-07: qty 15

## 2020-02-07 MED ORDER — ACETAMINOPHEN 500 MG PO TABS
1000.0000 mg | ORAL_TABLET | Freq: Once | ORAL | Status: AC
  Administered 2020-02-07: 1000 mg via ORAL
  Filled 2020-02-07: qty 2

## 2020-02-07 MED ORDER — ESMOLOL HCL 100 MG/10ML IV SOLN
INTRAVENOUS | Status: DC | PRN
  Administered 2020-02-07: 30 mg via INTRAVENOUS

## 2020-02-07 MED ORDER — PROPOFOL 10 MG/ML IV BOLUS
INTRAVENOUS | Status: AC
  Filled 2020-02-07: qty 20

## 2020-02-07 MED ORDER — DEXAMETHASONE SODIUM PHOSPHATE 10 MG/ML IJ SOLN
INTRAMUSCULAR | Status: DC | PRN
  Administered 2020-02-07: 10 mg via INTRAVENOUS

## 2020-02-07 MED ORDER — LIDOCAINE 2% (20 MG/ML) 5 ML SYRINGE
INTRAMUSCULAR | Status: DC | PRN
  Administered 2020-02-07: 100 mg via INTRAVENOUS

## 2020-02-07 MED ORDER — DEXAMETHASONE SODIUM PHOSPHATE 10 MG/ML IJ SOLN
INTRAMUSCULAR | Status: AC
  Filled 2020-02-07: qty 1

## 2020-02-07 MED ORDER — LACTATED RINGERS IV SOLN: INTRAVENOUS | Status: DC

## 2020-02-07 MED ORDER — MIDAZOLAM HCL 2 MG/2ML IJ SOLN
INTRAMUSCULAR | Status: DC | PRN
  Administered 2020-02-07: 1 mg via INTRAVENOUS

## 2020-02-07 MED ORDER — ESMOLOL HCL 100 MG/10ML IV SOLN
INTRAVENOUS | Status: AC
  Filled 2020-02-07: qty 10

## 2020-02-07 MED ORDER — IOHEXOL 300 MG/ML  SOLN
INTRAMUSCULAR | Status: DC | PRN
  Administered 2020-02-07: 8 mL

## 2020-02-07 MED ORDER — ONDANSETRON HCL 4 MG/2ML IJ SOLN
INTRAMUSCULAR | Status: DC | PRN
  Administered 2020-02-07: 4 mg via INTRAVENOUS

## 2020-02-07 MED ORDER — OXYCODONE HCL 5 MG PO TABS: 5.0000 mg | ORAL_TABLET | ORAL | Status: DC | PRN

## 2020-02-07 MED ORDER — ONDANSETRON HCL 4 MG/2ML IJ SOLN: 4.0000 mg | Freq: Once | INTRAMUSCULAR | Status: DC | PRN

## 2020-02-07 MED ORDER — ROCURONIUM BROMIDE 10 MG/ML (PF) SYRINGE
PREFILLED_SYRINGE | INTRAVENOUS | Status: AC
  Filled 2020-02-07: qty 10

## 2020-02-07 MED ORDER — EPHEDRINE 5 MG/ML INJ
INTRAVENOUS | Status: AC
  Filled 2020-02-07: qty 10

## 2020-02-07 MED ORDER — FENTANYL CITRATE (PF) 100 MCG/2ML IJ SOLN: 25.0000 ug | INTRAMUSCULAR | Status: DC | PRN

## 2020-02-07 MED ORDER — MORPHINE SULFATE (PF) 4 MG/ML IV SOLN: 2.0000 mg | INTRAVENOUS | Status: DC | PRN

## 2020-02-07 MED ORDER — FENTANYL CITRATE (PF) 250 MCG/5ML IJ SOLN: INTRAMUSCULAR | Status: DC | PRN

## 2020-02-07 MED ORDER — LACTATED RINGERS IV SOLN: INTRAVENOUS | Status: DC | PRN

## 2020-02-07 MED ORDER — ONDANSETRON HCL 4 MG/2ML IJ SOLN: 4.0000 mg | INTRAMUSCULAR | Status: DC | PRN

## 2020-02-07 MED ORDER — OXYBUTYNIN CHLORIDE 5 MG PO TABS: 5.0000 mg | ORAL_TABLET | Freq: Three times a day (TID) | ORAL | Status: DC | PRN

## 2020-02-07 MED ORDER — SODIUM CHLORIDE 0.9 % IV SOLN
1.0000 g | Freq: Once | INTRAVENOUS | Status: AC
  Administered 2020-02-07: 1 g via INTRAVENOUS
  Filled 2020-02-07: qty 1

## 2020-02-07 MED ORDER — DOCUSATE SODIUM 100 MG PO CAPS
100.0000 mg | ORAL_CAPSULE | Freq: Two times a day (BID) | ORAL | Status: DC
  Administered 2020-02-07 – 2020-02-08 (×2): 100 mg via ORAL
  Filled 2020-02-07 (×2): qty 1

## 2020-02-07 MED ORDER — SUCCINYLCHOLINE CHLORIDE 200 MG/10ML IV SOSY
PREFILLED_SYRINGE | INTRAVENOUS | Status: AC
  Filled 2020-02-07: qty 10

## 2020-02-07 MED ORDER — ACETAMINOPHEN 325 MG PO TABS: 650.0000 mg | ORAL_TABLET | ORAL | Status: DC | PRN

## 2020-02-07 MED ORDER — PHENYLEPHRINE 40 MCG/ML (10ML) SYRINGE FOR IV PUSH (FOR BLOOD PRESSURE SUPPORT)
PREFILLED_SYRINGE | INTRAVENOUS | Status: AC
  Filled 2020-02-07: qty 20

## 2020-02-07 MED ORDER — FENTANYL CITRATE (PF) 100 MCG/2ML IJ SOLN
INTRAMUSCULAR | Status: DC | PRN
  Administered 2020-02-07: 100 ug via INTRAVENOUS

## 2020-02-07 MED FILL — Oxycodone w/ Acetaminophen Tab 5-325 MG: ORAL | Qty: 6 | Status: AC

## 2020-02-07 SURGICAL SUPPLY — 14 items
BAG URO CATCHER STRL LF (MISCELLANEOUS) ×3 IMPLANT
CATH INTERMIT  6FR 70CM (CATHETERS) ×3 IMPLANT
CLOTH BEACON ORANGE TIMEOUT ST (SAFETY) ×3 IMPLANT
GLOVE BIO SURGEON STRL SZ 6 (GLOVE) ×3 IMPLANT
GOWN STRL REUS W/ TWL LRG LVL3 (GOWN DISPOSABLE) ×1 IMPLANT
GOWN STRL REUS W/TWL LRG LVL3 (GOWN DISPOSABLE) ×9 IMPLANT
GUIDEWIRE STR DUAL SENSOR (WIRE) ×3 IMPLANT
KIT TURNOVER KIT A (KITS) IMPLANT
MANIFOLD NEPTUNE II (INSTRUMENTS) ×3 IMPLANT
PACK CYSTO (CUSTOM PROCEDURE TRAY) ×3 IMPLANT
STENT CONTOUR 6FRX26X.038 (STENTS) ×2 IMPLANT
TUBING CONNECTING 10 (TUBING) ×2 IMPLANT
TUBING CONNECTING 10' (TUBING) ×1
TUBING UROLOGY SET (TUBING) IMPLANT

## 2020-02-07 NOTE — Transfer of Care (Signed)
Immediate Anesthesia Transfer of Care Note  Patient: Anthony Anderson  Procedure(s) Performed: CYSTOSCOPY RIGHT RETROGRADE PYLEOGRAM WITH RIGHT STENT PLACEMENT (Right )  Patient Location: PACU  Anesthesia Type:General  Level of Consciousness: awake and alert   Airway & Oxygen Therapy: Patient Spontanous Breathing and Patient connected to face mask oxygen  Post-op Assessment: Report given to RN and Post -op Vital signs reviewed and stable  Post vital signs: Reviewed and stable  Last Vitals:  Vitals Value Taken Time  BP 139/95 02/07/20 1819  Temp 37.1 C 02/07/20 1819  Pulse 78 02/07/20 1827  Resp 20 02/07/20 1827  SpO2 99 % 02/07/20 1827  Vitals shown include unvalidated device data.  Last Pain:  Vitals:   02/07/20 1819  TempSrc:   PainSc: 0-No pain      Patients Stated Pain Goal: 2 ([id number])  Complications: No apparent anesthesia complications

## 2020-02-07 NOTE — Anesthesia Procedure Notes (Signed)
Date/Time: 02/07/2020 6:12 PM Performed by: Cynda Familia, CRNA Oxygen Delivery Method: Simple face mask Placement Confirmation: positive ETCO2 and breath sounds checked- equal and bilateral Dental Injury: Teeth and Oropharynx as per pre-operative assessment

## 2020-02-07 NOTE — Op Note (Signed)
Preoperative diagnosis:  1. Right ureteral calculus with UTI/chills  Postoperative diagnosis:  1. Right ureteral calculus with UTI/chills  Procedure:  1. Cystoscopy 2. right ureteral stent placement 3. right retrograde pyelography with interpretation   Surgeon: Jacalyn Lefevre, MD  Anesthesia: General  Complications: None  Intraoperative findings:  right retrograde pyelography demonstrated a filling defect within the right ureter consistent with the patient's known calculus without other abnormalities.  EBL: Minimal  Specimens: None  Indication: Anthony Anderson is a 59 y.o. patient with 1.2 cm right px. After reviewing the management options for treatment, he elected to proceed with the above surgical procedure(s). We have discussed the potential benefits and risks of the procedure, side effects of the proposed treatment, the likelihood of the patient achieving the goals of the procedure, and any potential problems that might occur during the procedure or recuperation. Informed consent has been obtained.  Description of procedure:  The patient was taken to the operating room and general anesthesia was induced.  The patient was placed in the dorsal lithotomy position, prepped and draped in the usual sterile fashion, and preoperative antibiotics were administered. A preoperative time-out was performed.   Cystourethroscopy was performed.  The patient's urethra was examined and was normal (surgically absent prostate). The bladder was then systematically examined in its entirety. There was no evidence for any bladder tumors, stones, or other mucosal pathology.    Attention then turned to the rightureteral orifice and a ureteral catheter was used to intubate the ureteral orifice.  Omnipaque contrast was injected through the ureteral catheter and a retrograde pyelogram was performed with findings as dictated above.  A 0.38 sensor guidewire was then advanced up the right ureter into the renal  pelvis under fluoroscopic guidance.  The wire was then backloaded through the cystoscope and a ureteral stent was advance over the wire using Seldinger technique.  The stent was positioned appropriately under fluoroscopic and cystoscopic guidance.  The wire was then removed with an adequate stent curl noted in the renal pelvis as well as in the bladder.  The bladder was then emptied and the procedure ended.  The patient appeared to tolerate the procedure well and without complications.  The patient was able to be awakened and transferred to the recovery unit in satisfactory condition.   Follow up: The patient will be admitted to hospital for IV antibiotics and observation.  He will be scheduled for staged surgery after acute infection as resolved.    Jacalyn Lefevre, M.D.

## 2020-02-07 NOTE — Anesthesia Procedure Notes (Deleted)
Performed by: Cellie Dardis M, CRNA       

## 2020-02-07 NOTE — Anesthesia Preprocedure Evaluation (Addendum)
Anesthesia Evaluation  Patient identified by MRN, date of birth, ID band Patient awake    Reviewed: Allergy & Precautions, NPO status , Patient's Chart, lab work & pertinent test results, reviewed documented beta blocker date and time   History of Anesthesia Complications Negative for: history of anesthetic complications  Airway Mallampati: II  TM Distance: >3 FB Neck ROM: Full    Dental  (+) Partial Upper, Dental Advisory Given   Pulmonary shortness of breath, sleep apnea and Continuous Positive Airway Pressure Ventilation , COPD,  COPD inhaler, former smoker,    breath sounds clear to auscultation       Cardiovascular hypertension, Pt. on medications and Pt. on home beta blockers (-) angina Rhythm:Regular Rate:Normal  '15 ECHO: EF 65% to 70%. Mild MR   Neuro/Psych negative neurological ROS  negative psych ROS   GI/Hepatic Neg liver ROS, hiatal hernia, GERD  Medicated and Controlled,  Endo/Other  Obesity   Renal/GU Renal InsufficiencyRenal disease (creat 2.12)H/o stones  right ureteral obstruction   Prostate cancer    Musculoskeletal   Abdominal (+) + obese,   Peds  Hematology negative hematology ROS (+)   Anesthesia Other Findings   Reproductive/Obstetrics                           Anesthesia Physical  Anesthesia Plan  ASA: III  Anesthesia Plan: General   Post-op Pain Management:    Induction: Intravenous  PONV Risk Score and Plan: 4 or greater and Ondansetron, Dexamethasone, Midazolam and Treatment may vary due to age or medical condition  Airway Management Planned: LMA  Additional Equipment:   Intra-op Plan:   Post-operative Plan: Extubation in OR  Informed Consent: I have reviewed the patients History and Physical, chart, labs and discussed the procedure including the risks, benefits and alternatives for the proposed anesthesia with the patient or authorized  representative who has indicated his/her understanding and acceptance.     Dental advisory given  Plan Discussed with: CRNA  Anesthesia Plan Comments:        Anesthesia Quick Evaluation

## 2020-02-07 NOTE — Anesthesia Procedure Notes (Signed)
Procedure Name: LMA Insertion Date/Time: 02/07/2020 5:53 PM Performed by: Cynda Familia, CRNA Pre-anesthesia Checklist: Patient identified, Emergency Drugs available, Suction available and Patient being monitored Patient Re-evaluated:Patient Re-evaluated prior to induction Oxygen Delivery Method: Circle System Utilized Preoxygenation: Pre-oxygenation with 100% oxygen Induction Type: IV induction Ventilation: Mask ventilation without difficulty LMA: LMA inserted and LMA with gastric port inserted LMA Size: 4.0 Number of attempts: 1 Placement Confirmation: positive ETCO2 Tube secured with: Tape Dental Injury: Teeth and Oropharynx as per pre-operative assessment  Comments: Smooth IV induction-- Gifford Shave-- LMA insertion AM CRNA-- atraumatic-- teeth and mouth as preop

## 2020-02-07 NOTE — Progress Notes (Deleted)
  The note originally documented on this encounter has been moved the the encounter in which it belongs.  

## 2020-02-07 NOTE — H&P (Signed)
H&P  Chief Complaint:  Kidney stone  History of Present Illness:  59 year old male presented for routine office visit here in [facility] for follow-up of prostate cancer, after radical prostatectomy.  He has had no evidence of recurrence of his disease.    He presented to the emergency room at [facility] yesterday with acute onset right flank pain accompanied by shakes and chills.  He was found to have a 12 mm right renal pelvic / UPJ stone with mild-to-moderate hydronephrosis as well as infected urine.  He subsequently presented to the office here for further management.  He has had some nausea but no vomiting.  He has been given antibiotics intravenously prior to his leaving the emergency room.    Reviewing the patient's CT scan, I have recommended urgent cystoscopy, stenting, as well as probable observation following this procedure, the followed by a course of antibiotics and down the road primary treatment of his kidney stone.  Past Medical History:  Diagnosis Date  . Cancer [facility])    prostate, currently watching for any changes  . Cholelithiasis   . GERD (gastroesophageal reflux disease)   . History of hiatal hernia   . History of kidney stones   . Hyperlipidemia   . Hypertension   . Kidney stones   . PSA elevation   . Sleep apnea    uses CPAP    Past Surgical History:  Procedure Laterality Date  . LEFT HEART CATHETERIZATION WITH CORONARY ANGIOGRAM N/A 10/26/2014   Procedure: LEFT HEART CATHETERIZATION WITH CORONARY ANGIOGRAM;  Surgeon: Sinclair Grooms, MD;  Location: [facility] CATH LAB;  Service: Cardiovascular;  Laterality: N/A;  . LITHOTRIPSY    . MASS EXCISION Left 01/09/2018   Procedure: EXCISION SUBCUTANEOUS MASS, CHEST WALL;  Surgeon: Aviva Signs, MD;  Location: [facility] ORS;  Service: General;  Laterality: Left;  . NASAL SINUS SURGERY    . PELVIC LYMPH NODE DISSECTION Bilateral 09/11/2018   Procedure: PELVIC LYMPH NODE DISSECTION;  Surgeon: Ardis Hughs, MD;   Location: [facility] ORS;  Service: Urology;  Laterality: Bilateral;  . ROBOT ASSISTED LAPAROSCOPIC RADICAL PROSTATECTOMY N/A 09/11/2018   Procedure: XI ROBOTIC ASSISTED LAPAROSCOPIC RADICAL PROSTATECTOMY;  Surgeon: Ardis Hughs, MD;  Location: [facility] ORS;  Service: Urology;  Laterality: N/A;    Home Medications:  Allergies as of 02/07/2020   No Known Allergies     Medication List       Accurate as of February 07, 2020  2:06 PM. If you have any questions, ask your nurse or doctor.        albuterol 108 (90 Base) MCG/ACT inhaler Commonly known as: VENTOLIN HFA Inhale 1-2 puffs into the lungs every 6 (six) hours as needed for wheezing or shortness of breath.   aspirin EC 81 MG tablet Take 81 mg by mouth every other day.   cephALEXin 500 MG capsule Commonly known as: KEFLEX Take 1 capsule (500 mg total) by mouth 4 (four) times daily.   losartan 100 MG tablet Commonly known as: COZAAR Take 100 mg by mouth daily.   metoprolol succinate 25 MG 24 hr tablet Commonly known as: TOPROL-XL Take 25 mg by mouth at bedtime.   multivitamin with minerals Tabs tablet Take 1 tablet by mouth daily.   olmesartan 40 MG tablet Commonly known as: BENICAR Take 40 mg by mouth daily.   ondansetron 4 MG tablet Commonly known as: ZOFRAN Take 1 tablet (4 mg total) by mouth every 6 (six) hours.   ondansetron 4 MG  tablet Commonly known as: ZOFRAN Take 1 tablet (4 mg total) by mouth every 8 (eight) hours as needed for nausea or vomiting.   oxyCODONE-acetaminophen 5-325 MG tablet Commonly known as: PERCOCET/ROXICET Take 1 tablet by mouth every 4 (four) hours as needed.   oxyCODONE-acetaminophen 5-325 MG tablet Commonly known as: PERCOCET/ROXICET Take 1 tablet by mouth every 4 (four) hours as needed for severe pain.   pantoprazole 40 MG tablet Commonly known as: PROTONIX Take 40 mg by mouth daily.   rosuvastatin 10 MG tablet Commonly known as: CRESTOR Take 10 mg by mouth daily.   SOOTHE XP  OP Place 1 drop into both eyes at bedtime as needed (for dry eyes).       Allergies: No Known Allergies  Family History  Problem Relation Age of Onset  . CAD Mother   . CAD Father   . Prostate cancer Father     Social History:  reports that he quit smoking about 37 years ago. His smoking use included cigarettes. He has a 10.00 pack-year smoking history. He has never used smokeless tobacco. He reports that he does not drink alcohol or use drugs.  ROS: A complete review of systems was performed.  All systems are negative except for pertinent findings as noted.  Physical Exam:  Vital signs in last 24 hours: There were no vitals taken for this visit. Constitutional:  Alert and oriented, No acute distress Cardiovascular: Regular rate  Respiratory: Normal respiratory effort GI: Abdomen is soft, nontender, nondistended, no abdominal masses. No CVAT.  Genitourinary: Normal male phallus, testes are descended bilaterally and non-tender and without masses, scrotum is normal in appearance without lesions or masses, perineum is normal on inspection. Lymphatic: No lymphadenopathy Neurologic: Grossly intact, no focal deficits Psychiatric: Normal mood and affect  Laboratory Data:  Recent Labs    02/06/20 1700  WBC 13.3*  HGB 16.8  HCT 51.0  PLT 215    Recent Labs    02/06/20 1700  NA 141  K 4.3  CL 108  GLUCOSE 132*  BUN 21*  CALCIUM 9.5  CREATININE 1.57*     Results for orders placed or performed during the hospital encounter of 02/06/20 (from the past 24 hour(s))  CBC with Differential     Status: Abnormal   Collection Time: 02/06/20  5:00 PM  Result Value Ref Range   WBC 13.3 (H) 4.0 - 10.5 K/uL   RBC 5.81 4.22 - 5.81 MIL/uL   Hemoglobin 16.8 13.0 - 17.0 g/dL   HCT 51.0 39.0 - 52.0 %   MCV 87.8 80.0 - 100.0 fL   MCH 28.9 26.0 - 34.0 pg   MCHC 32.9 30.0 - 36.0 g/dL   RDW 12.8 11.5 - 15.5 %   Platelets 215 150 - 400 K/uL   nRBC 0.0 0.0 - 0.2 %   Neutrophils  Relative % 88 %   Neutro Abs 11.8 (H) 1.7 - 7.7 K/uL   Lymphocytes Relative 5 %   Lymphs Abs 0.6 (L) 0.7 - 4.0 K/uL   Monocytes Relative 5 %   Monocytes Absolute 0.7 0.1 - 1.0 K/uL   Eosinophils Relative 1 %   Eosinophils Absolute 0.1 0.0 - 0.5 K/uL   Basophils Relative 1 %   Basophils Absolute 0.1 0.0 - 0.1 K/uL   Immature Granulocytes 0 %   Abs Immature Granulocytes 0.04 0.00 - 0.07 K/uL  Basic metabolic panel     Status: Abnormal   Collection Time: 02/06/20  5:00 PM  Result Value  Ref Range   Sodium 141 135 - 145 mmol/L   Potassium 4.3 3.5 - 5.1 mmol/L   Chloride 108 98 - 111 mmol/L   CO2 23 22 - 32 mmol/L   Glucose, Bld 132 (H) 70 - 99 mg/dL   BUN 21 (H) 6 - 20 mg/dL   Creatinine, Ser 1.57 (H) 0.61 - 1.24 mg/dL   Calcium 9.5 8.9 - 10.3 mg/dL   GFR calc non Af Amer 48 (L) >60 mL/min   GFR calc Af Amer 55 (L) >60 mL/min   Anion gap 10 5 - 15  Urinalysis, Routine w reflex microscopic     Status: Abnormal   Collection Time: 02/06/20  8:42 PM  Result Value Ref Range   Color, Urine YELLOW YELLOW   APPearance HAZY (A) CLEAR   Specific Gravity, Urine 1.021 1.005 - 1.030   pH 5.0 5.0 - 8.0   Glucose, UA NEGATIVE NEGATIVE mg/dL   Hgb urine dipstick MODERATE (A) NEGATIVE   Bilirubin Urine NEGATIVE NEGATIVE   Ketones, ur NEGATIVE NEGATIVE mg/dL   Protein, ur 30 (A) NEGATIVE mg/dL   Nitrite POSITIVE (A) NEGATIVE   Leukocytes,Ua LARGE (A) NEGATIVE   RBC / HPF 21-50 0 - 5 RBC/hpf   WBC, UA >50 (H) 0 - 5 WBC/hpf   Bacteria, UA MANY (A) NONE SEEN   Squamous Epithelial / LPF 0-5 0 - 5   Mucus PRESENT    No results found for this or any previous visit (from the past 240 hour(s)).  Renal Function: Recent Labs    02/06/20 1700  CREATININE 1.57*   Estimated Creatinine Clearance: 63.9 mL/min (A) (by C-G formula based on SCr of 1.57 mg/dL (H)).  Radiologic Imaging: CT Renal Stone Study  Result Date: 02/06/2020 CLINICAL DATA:  59 year old male with flank pain. Concern for kidney  stone. EXAM: CT ABDOMEN AND PELVIS WITHOUT CONTRAST TECHNIQUE: Multidetector CT imaging of the abdomen and pelvis was performed following the standard protocol without IV contrast. COMPARISON:  None. FINDINGS: Evaluation of this exam is limited in the absence of intravenous contrast. Lower chest: Bibasilar linear atelectasis/scarring. The visualized lung bases are otherwise clear. No intra-abdominal free air or free fluid. Hepatobiliary: Diffuse fatty infiltration of the liver. No intrahepatic biliary ductal dilatation. There are several stones in the gallbladder. No pericholecystic fluid. Pancreas: Unremarkable. No pancreatic ductal dilatation or surrounding inflammatory changes. Spleen: Faint small hypodense focus within the spleen is not well characterized but may represent a cyst or hemangioma. The spleen is otherwise unremarkable. Adrenals/Urinary Tract: The adrenal glands are unremarkable. There is a 12 mm stone in the right renal pelvis obstructing the ureteropelvic junction with mild right hydronephrosis. Probable tiny additional nonobstructing right renal calculi are present. Layering appearing 4 mm stone in the interpolar renal parenchyma may represent parenchymal calcification or layering stone or milk of calcium in a dilated calyx. Small left renal parenchymal calcifications noted. There is a 3.5 cm left renal upper pole partially exophytic hypodense lesion which is not characterized but demonstrates fluid attenuation, likely a cyst. There is no hydronephrosis or obstructing stone on the left. The urinary bladder is collapsed. Stomach/Bowel: There is a moderate size hiatal hernia. There is sigmoid diverticulosis without active inflammatory changes. There is no bowel obstruction or active inflammation. The appendix is normal. Vascular/Lymphatic: The abdominal aorta and IVC are unremarkable on this noncontrast CT. No portal venous gas. There is mild haziness of the mesentery with multiple mildly enlarged  lymph nodes with a "misty mesentery" appearance. This  finding is nonspecific but may be related to underlying inflammatory/infectious etiology. Reproductive: Radical prostatectomy. Other: There is a 4.5 x 6 cm fluid attenuating structure along the left lateral pelvic wall which is not characterized on this noncontrast CT but favored to represent a lymphocele. If there is clinical concern for malignancy MRI may provide better characterization on a non emergent/outpatient basis. Musculoskeletal: No acute or significant osseous findings. IMPRESSION: 1. A 12 mm right UPJ stone with mild right hydronephrosis. Additional small nonobstructing right renal calculi noted. 2. Fatty liver. 3. Cholelithiasis. 4. Sigmoid diverticulosis. No bowel obstruction. Normal appendix. 5. A 4.5 x 6 cm fluid attenuating structure along the left lateral pelvic wall favored to represent a lymphocele. 6. Moderate size hiatal hernia. Electronically Signed   By: Anner Crete M.D.   On: 02/06/2020 18:51    Impression/Assessment:    Right renal pelvic stone with probable UTI  Plan:   1.  His urine was cultured in our office today.  He did receive IV antibiotics while in the emergency room  2.  I have recommended urgent cystoscopy, retrograde pyelogram, right side double-J stent placement.  I have spoken with Dr. Leonia Reader prm this procedurePace who is on-call tonight who will   Performed this procedure.

## 2020-02-08 ENCOUNTER — Encounter: Payer: Self-pay | Admitting: *Deleted

## 2020-02-08 DIAGNOSIS — G4730 Sleep apnea, unspecified: Secondary | ICD-10-CM | POA: Diagnosis not present

## 2020-02-08 DIAGNOSIS — E785 Hyperlipidemia, unspecified: Secondary | ICD-10-CM | POA: Diagnosis not present

## 2020-02-08 DIAGNOSIS — Z6835 Body mass index (BMI) 35.0-35.9, adult: Secondary | ICD-10-CM | POA: Diagnosis not present

## 2020-02-08 DIAGNOSIS — Z20822 Contact with and (suspected) exposure to covid-19: Secondary | ICD-10-CM | POA: Diagnosis not present

## 2020-02-08 DIAGNOSIS — N390 Urinary tract infection, site not specified: Secondary | ICD-10-CM | POA: Diagnosis not present

## 2020-02-08 DIAGNOSIS — R6883 Chills (without fever): Secondary | ICD-10-CM | POA: Diagnosis not present

## 2020-02-08 DIAGNOSIS — E669 Obesity, unspecified: Secondary | ICD-10-CM | POA: Diagnosis not present

## 2020-02-08 DIAGNOSIS — K449 Diaphragmatic hernia without obstruction or gangrene: Secondary | ICD-10-CM | POA: Diagnosis not present

## 2020-02-08 DIAGNOSIS — Z79899 Other long term (current) drug therapy: Secondary | ICD-10-CM | POA: Diagnosis not present

## 2020-02-08 DIAGNOSIS — J449 Chronic obstructive pulmonary disease, unspecified: Secondary | ICD-10-CM | POA: Diagnosis not present

## 2020-02-08 DIAGNOSIS — K219 Gastro-esophageal reflux disease without esophagitis: Secondary | ICD-10-CM | POA: Diagnosis not present

## 2020-02-08 DIAGNOSIS — Z8546 Personal history of malignant neoplasm of prostate: Secondary | ICD-10-CM | POA: Diagnosis not present

## 2020-02-08 DIAGNOSIS — N132 Hydronephrosis with renal and ureteral calculous obstruction: Secondary | ICD-10-CM | POA: Diagnosis not present

## 2020-02-08 DIAGNOSIS — Z87442 Personal history of urinary calculi: Secondary | ICD-10-CM | POA: Diagnosis not present

## 2020-02-08 DIAGNOSIS — I10 Essential (primary) hypertension: Secondary | ICD-10-CM | POA: Diagnosis not present

## 2020-02-08 DIAGNOSIS — Z7982 Long term (current) use of aspirin: Secondary | ICD-10-CM | POA: Diagnosis not present

## 2020-02-08 LAB — CBC
HCT: 45.5 % (ref 39.0–52.0)
Hemoglobin: 15.2 g/dL (ref 13.0–17.0)
MCH: 29.1 pg (ref 26.0–34.0)
MCHC: 33.4 g/dL (ref 30.0–36.0)
MCV: 87.2 fL (ref 80.0–100.0)
Platelets: 172 10*3/uL (ref 150–400)
RBC: 5.22 MIL/uL (ref 4.22–5.81)
RDW: 12.7 % (ref 11.5–15.5)
WBC: 10.1 10*3/uL (ref 4.0–10.5)
nRBC: 0 % (ref 0.0–0.2)

## 2020-02-08 LAB — HIV ANTIBODY (ROUTINE TESTING W REFLEX): HIV Screen 4th Generation wRfx: NONREACTIVE

## 2020-02-08 MED ORDER — TAMSULOSIN HCL 0.4 MG PO CAPS: 0.4000 mg | ORAL_CAPSULE | Freq: Every day | ORAL | 0 refills | Status: DC

## 2020-02-08 NOTE — Discharge Summary (Signed)
Date of admission: 02/07/2020  Date of discharge: 02/08/2020  Admission diagnosis: right ureteral calculus  Discharge diagnosis: right ureteral calculus  Secondary diagnoses:  Patient Active Problem List   Diagnosis Date Noted  . Ureteral calculus 02/07/2020  . Prostate cancer ([place]) 09/11/2018  . Lipoma of torso   . Essential hypertension 10/26/2014  . Hyperlipidemia 10/26/2014  . GERD (gastroesophageal reflux disease) 10/26/2014  . Chest pain   . Shortness of breath 10/24/2014  . Near syncope 10/24/2014  . Dyspnea 10/24/2014    Procedures performed: Procedure(s): CYSTOSCOPY RIGHT RETROGRADE PYLEOGRAM WITH RIGHT STENT PLACEMENT  History and Physical: For full details, please see admission history and physical. Briefly, Anthony Anderson is a 59 y.o. year old patient with right ureteral calculus and UTI.   Hospital Course: Patient tolerated the procedure well.  He was then transferred to the floor after an uneventful PACU stay.  His hospital course was uncomplicated.  On POD#1 he had met discharge criteria: was eating a regular diet, was up and ambulating independently,  pain was well controlled, was voiding without a catheter, and was ready to for discharge.   Laboratory values:  Recent Labs    02/06/20 1700 02/07/20 1530 02/08/20 0343  WBC 13.3* 11.3* 10.1  HGB 16.8 15.8 15.2  HCT 51.0 47.0 45.5   Recent Labs    02/06/20 1700 02/07/20 1530  NA 141 140  K 4.3 4.1  CL 108 104  CO2 23 23  GLUCOSE 132* 118*  BUN 21* 26*  CREATININE 1.57* 2.12*  CALCIUM 9.5 9.1   No results for input(s): LABPT, INR in the last 72 hours. No results for input(s): LABURIN in the last 72 hours. Results for orders placed or performed during the hospital encounter of 02/07/20  Respiratory Panel by RT PCR (Flu A&B, Covid) - Nasopharyngeal Swab     Status: None   Collection Time: 02/07/20  3:12 PM   Specimen: Nasopharyngeal Swab  Result Value Ref Range Status   SARS Coronavirus 2 by RT  PCR NEGATIVE NEGATIVE Final    Comment: (NOTE) SARS-CoV-2 target nucleic acids are NOT DETECTED. The SARS-CoV-2 RNA is generally detectable in upper respiratoy specimens during the acute phase of infection. The lowest concentration of SARS-CoV-2 viral copies this assay can detect is 131 copies/mL. A negative result does not preclude SARS-Cov-2 infection and should not be used as the sole basis for treatment or other patient management decisions. A negative result may occur with  improper specimen collection/handling, submission of specimen other than nasopharyngeal swab, presence of viral mutation(s) within the areas targeted by this assay, and inadequate number of viral copies (<131 copies/mL). A negative result must be combined with clinical observations, patient history, and epidemiological information. The expected result is Negative. Fact Sheet for Patients:  PinkCheek.be Fact Sheet for Healthcare Providers:  GravelBags.it This test is not yet ap proved or cleared by the [place] FDA and  has been authorized for detection and/or diagnosis of SARS-CoV-2 by FDA under an Emergency Use Authorization (EUA). This EUA will remain  in effect (meaning this test can be used) for the duration of the COVID-19 declaration under Section 564(b)(1) of the Act, 21 U.S.C. section 360bbb-3(b)(1), unless the authorization is terminated or revoked sooner.    Influenza A by PCR NEGATIVE NEGATIVE Final   Influenza B by PCR NEGATIVE NEGATIVE Final    Comment: (NOTE) The Xpert Xpress SARS-CoV-2/FLU/RSV assay is intended as an aid in  the diagnosis of influenza from Nasopharyngeal swab specimens  and  should not be used as a sole basis for treatment. Nasal washings and  aspirates are unacceptable for Xpert Xpress SARS-CoV-2/FLU/RSV  testing. Fact Sheet for Patients: PinkCheek.be Fact Sheet for Healthcare  Providers: GravelBags.it This test is not yet approved or cleared by the [place] FDA and  has been authorized for detection and/or diagnosis of SARS-CoV-2 by  FDA under an Emergency Use Authorization (EUA). This EUA will remain  in effect (meaning this test can be used) for the duration of the  Covid-19 declaration under Section 564(b)(1) of the Act, 21  U.S.C. section 360bbb-3(b)(1), unless the authorization is  terminated or revoked. Performed at [facility], [place] [address], [place], [place] [address]     Disposition: Home  Discharge instruction: The patient was instructed to be ambulatory but told to refrain from heavy lifting, strenuous activity, or driving.   Discharge medications:  Allergies as of 02/08/2020   No Known Allergies     Medication List    TAKE these medications   albuterol 108 (90 Base) MCG/ACT inhaler Commonly known as: VENTOLIN HFA Inhale 1-2 puffs into the lungs every 6 (six) hours as needed for wheezing or shortness of breath.   aspirin EC 81 MG tablet Take 81 mg by mouth every other day.   cephALEXin 500 MG capsule Commonly known as: KEFLEX Take 1 capsule (500 mg total) by mouth 4 (four) times daily.   losartan 100 MG tablet Commonly known as: COZAAR Take 100 mg by mouth daily.   metoprolol succinate 25 MG 24 hr tablet Commonly known as: TOPROL-XL Take 25 mg by mouth at bedtime.   multivitamin with minerals Tabs tablet Take 1 tablet by mouth daily.   olmesartan 40 MG tablet Commonly known as: BENICAR Take 40 mg by mouth daily.   ondansetron 4 MG tablet Commonly known as: ZOFRAN Take 1 tablet (4 mg total) by mouth every 6 (six) hours. What changed: Another medication with the same name was removed. Continue taking this medication, and follow the directions you see here.   oxyCODONE-acetaminophen 5-325 MG tablet Commonly known as: PERCOCET/ROXICET Take 1 tablet by mouth every 4 (four)  hours as needed. What changed: Another medication with the same name was removed. Continue taking this medication, and follow the directions you see here.   pantoprazole 40 MG tablet Commonly known as: PROTONIX Take 40 mg by mouth daily.   rosuvastatin 10 MG tablet Commonly known as: CRESTOR Take 10 mg by mouth daily.   SOOTHE XP OP Place 1 drop into both eyes at bedtime as needed (for dry eyes).       Followup:  To be scheduled with Dr. Diona Fanti

## 2020-02-08 NOTE — Progress Notes (Signed)
Pt discharge to home, instructions reviewed with pt, pt acknowledged understanding. SRP<RN

## 2020-02-08 NOTE — Progress Notes (Signed)
Pt states he had [place] sandwich attempt to call MD, pt stable, noted reviewed, ordered Regular diet. SRp, RN

## 2020-02-08 NOTE — Discharge Instructions (Signed)
To Whom It May Concern,  Patient is under the care of Dr. Claudia Desanctis and is cleared to return to work on 02/11/20.   For concerns or questions, please call [phone]  Thank you, Anthony Lefevre, MD   Post stent placement instructions   Definitions:  Ureter: The duct that transports urine from the kidney to the bladder. Stent: A plastic hollow tube that is placed into the ureter, from the kidney to the bladder to prevent the ureter from swelling shut.  General instructions:  Despite the fact that no skin incisions were used, the area around the ureter and bladder is raw and irritated. The stent is a foreign body which can further irritate the bladder wall. This irritation is manifested by increased frequency of urination, both day and night, and by an increase in the urge to urinate. In some, the urge to urinate is present almost always. Sometimes the urge is strong enough that you may not be able to stop your self from urinating. This can often be controlled with medication but does not occur in everyone. A stent can safely be left in place for 3 months or greater.  You may see some blood in your urine while the stent is in place and a few days afterward. Do not be alarmed, even if the urine is clear for a while. Get off your feet and drink lots of fluids until clearing occurs. If you start to pass clots or don't improve, call us.  Diet:  You may return to your normal diet immediately. Because of the raw surface of your bladder, alcohol, spicy foods, foods high in acid and drinks with caffeine may cause irritation or frequency and should be used in moderation. To keep your urine flowing freely and avoid constipation, drink plenty of fluids during the day (8-10 glasses). Tip: Avoid cranberry juice because it is very acidic.  Activity:  Your physical activity doesn't need to be restricted. However, if you are very active, you may see some blood in the urine. We suggest that you reduce your  activity under the circumstances until the bleeding has stopped.  Bowels:  It is important to keep your bowels regular during the postoperative period. Straining with bowel movements can cause bleeding. A bowel movement every other day is reasonable. Use a mild laxative if needed, such as milk of magnesia 2-3 tablespoons, or 2 Dulcolax tablets. Call if you continue to have problems. If you had been taking narcotics for pain, before, during or after your surgery, you may be constipated. Take a laxative if necessary.  Medication:  You should resume your pre-surgery medications unless told not to. In addition you may be given an antibiotic to prevent or treat infection. Antibiotics are not always necessary. All medication should be taken as prescribed until the bottles are finished unless you are having an unusual reaction to one of the drugs.  Problems you should report to [place]:  a. Fever greater than 101F. b. Heavy bleeding, or clots (see notes above about blood in urine). c. Inability to urinate. d. Drug reactions (hives, rash, nausea, vomiting, diarrhea). e. Severe burning or pain with urination that is not improving.

## 2020-02-08 NOTE — Plan of Care (Signed)
  Problem: Education: Goal: Knowledge of General Education information will improve Description Including pain rating scale, medication(s)/side effects and non-pharmacologic comfort measures Outcome: Progressing   

## 2020-02-08 NOTE — Progress Notes (Signed)
Pt resting without distress, voiding clear yellow urine with an occasional small clot. Pt denies pain. Will cont to monitor. Wife at bedside. SRP, RN

## 2020-02-08 NOTE — Anesthesia Postprocedure Evaluation (Signed)
Anesthesia Post Note  Patient: Anthony Anderson  Procedure(s) Performed: CYSTOSCOPY RIGHT RETROGRADE PYLEOGRAM WITH RIGHT STENT PLACEMENT (Right )     Patient location during evaluation: PACU Anesthesia Type: General Level of consciousness: awake and alert Pain management: pain level controlled Vital Signs Assessment: post-procedure vital signs reviewed and stable Respiratory status: spontaneous breathing, nonlabored ventilation, respiratory function stable and patient connected to nasal cannula oxygen Cardiovascular status: blood pressure returned to baseline and stable Postop Assessment: no apparent nausea or vomiting Anesthetic complications: no    Last Vitals:  Vitals:   02/08/20 0943 02/08/20 1304  BP: (!) 155/93 (!) 146/78  Pulse: 71 69  Resp: 18 20  Temp: 36.8 C 36.9 C  SpO2: 94% 95%    Last Pain:  Vitals:   02/08/20 0943  TempSrc: Oral  PainSc:                  Catalina Gravel

## 2020-02-08 NOTE — Progress Notes (Signed)
Pt voiding yellow urine with few clots noted. Denies pain or spasms. SRP, RN

## 2020-02-09 LAB — URINE CULTURE: Culture: >=100000 — AB

## 2020-02-10 ENCOUNTER — Telehealth: Payer: Self-pay

## 2020-02-10 NOTE — Telephone Encounter (Signed)
Called pt to change abx per Madilyn Hook PA for UC in ED 02/06/20  States MD has already taken care of

## 2020-02-14 DIAGNOSIS — N3000 Acute cystitis without hematuria: Secondary | ICD-10-CM | POA: Diagnosis not present

## 2020-02-14 DIAGNOSIS — N200 Calculus of kidney: Secondary | ICD-10-CM | POA: Diagnosis not present

## 2020-02-16 ENCOUNTER — Other Ambulatory Visit: Payer: Self-pay | Admitting: Urology

## 2020-02-17 NOTE — Patient Instructions (Addendum)
DUE TO COVID-19 ONLY ONE VISITOR IS ALLOWED TO COME WITH YOU AND STAY IN THE WAITING ROOM ONLY DURING PRE OP AND PROCEDURE DAY OF SURGERY. THE 1 VISITOR MAY VISIT WITH YOU AFTER SURGERY IN YOUR PRIVATE ROOM DURING VISITING HOURS ONLY!  YOU NEED TO HAVE A COVID 19 TEST ON_4/26______ @_9 :50______, THIS TEST MUST BE DONE BEFORE SURGERY, COME  [address], [place] , [address].  ([place]) ONCE YOUR COVID TEST IS COMPLETED, PLEASE BEGIN THE QUARANTINE INSTRUCTIONS AS OUTLINED IN YOUR HANDOUT.                Anthony Anderson   Your procedure is scheduled on: 02/24/20   Report to [facility] Main  Entrance   Report to admitting at   10:30 PM     Call this number if you have problems the morning of surgery [phone]    Remember: Do not eat food or drink liquids :After Midnight.   BRUSH YOUR TEETH MORNING OF SURGERY AND RINSE YOUR MOUTH OUT, NO CHEWING GUM CANDY OR MINTS.     Take these medicines the morning of surgery with A SIP OF WATER: Metoprolol, Flomax, Protonix  Bring your mask and tubing with you to the hospital with you.                                 You may not have any metal on your body including              piercings  Do not wear jewelry,  lotions, powders or  deodorant                        Men may shave face and neck.   Do not bring valuables to the hospital. [place].  Contacts, dentures or bridgework may not be worn into surgery.      Patients discharged the day of surgery will not be allowed to drive home.   IF YOU ARE HAVING SURGERY AND GOING HOME THE SAME DAY, YOU MUST HAVE AN ADULT TO DRIVE YOU HOME AND BE WITH YOU FOR 24 HOURS  . YOU MAY GO HOME BY TAXI OR UBER OR ORTHERWISE, BUT AN ADULT MUST ACCOMPANY YOU HOME AND STAY WITH YOU FOR 24 HOURS.  Name and phone number of your driver:  Special Instructions: N/A              Please read over the following fact sheets you  were given: _____________________________________________________________________             [facility] - Preparing for Surgery Before surgery, you can play an important role.   Because skin is not sterile, your skin needs to be as free of germs as possible.   You can reduce the number of germs on your skin by washing with CHG (chlorahexidine gluconate) soap before surgery.   CHG is an antiseptic cleaner which kills germs and bonds with the skin to continue killing germs even after washing. Please DO NOT use if you have an allergy to CHG or antibacterial soaps.   If your skin becomes reddened/irritated stop using the CHG and inform your nurse when you arrive at Short Stay.  You may shave your face/neck.  Please follow these instructions carefully:  1.  Shower with CHG Soap the night before surgery and the  morning of Surgery.  2.  If you choose to wash your hair, wash your hair first as usual with your  normal  shampoo.  3.  After you shampoo, rinse your hair and body thoroughly to remove the  shampoo.                                        4.  Use CHG as you would any other liquid soap.  You can apply chg directly  to the skin and wash                       Gently with a scrungie or clean washcloth.  5.  Apply the CHG Soap to your body ONLY FROM THE NECK DOWN.   Do not use on face/ open                           Wound or open sores. Avoid contact with eyes, ears mouth and genitals (private parts).                       Wash face,  Genitals (private parts) with your normal soap.             6.  Wash thoroughly, paying special attention to the area where your surgery  will be performed.  7.  Thoroughly rinse your body with warm water from the neck down.  8.  DO NOT shower/wash with your normal soap after using and rinsing off  the CHG Soap.              9. Pat yourself dry with a clean towel.            10.  Wear clean pajamas.            11.  Place clean sheets on your bed the night  of your first shower and do not  sleep with pets. Day of Surgery : Do not apply any lotions/deodorants the morning of surgery.  Please wear clean clothes to the hospital/surgery center.  FAILURE TO FOLLOW THESE INSTRUCTIONS MAY RESULT IN THE CANCELLATION OF YOUR SURGERY PATIENT SIGNATURE_________________________________  NURSE SIGNATURE__________________________________  ________________________________________________________________________

## 2020-02-18 ENCOUNTER — Other Ambulatory Visit: Payer: Self-pay

## 2020-02-18 ENCOUNTER — Encounter (HOSPITAL_COMMUNITY): Payer: Self-pay

## 2020-02-18 ENCOUNTER — Encounter (HOSPITAL_COMMUNITY)
Admission: RE | Admit: 2020-02-18 | Discharge: 2020-02-18 | Disposition: A | Discharge status: Home / Self Care | Payer: BC Managed Care – PPO | Source: Ambulatory Visit | Attending: Urology | Admitting: Urology

## 2020-02-18 DIAGNOSIS — Z01812 Encounter for preprocedural laboratory examination: Secondary | ICD-10-CM | POA: Insufficient documentation

## 2020-02-18 HISTORY — DX: Cardiac arrhythmia, unspecified: I49.9

## 2020-02-18 NOTE — Progress Notes (Signed)
PCP - Dr. Lenetta Quaker Cardiologist -none  Chest x-ray - no EKG - 05/27/19 Stress Test - no ECHO - 08/08/69 Cardiac Cath - 10/26/2014  Sleep Study - yes CPAP - yes  Fasting Blood Sugar - NA Checks Blood Sugar _____ times a day  Blood Thinner Instructions:ASA Aspirin Instructions:none Last Dose:02/08/20  Anesthesia review:   Patient denies shortness of breath, fever, cough and chest pain at PAT appointment yes  Patient verbalized understanding of instructions that were given to them at the PAT appointment. Patient was also instructed that they will need to review over the PAT instructions again at home before surgery. yes

## 2020-02-21 ENCOUNTER — Other Ambulatory Visit (HOSPITAL_COMMUNITY)
Admission: RE | Admit: 2020-02-21 | Discharge: 2020-02-21 | Disposition: A | Discharge status: Home / Self Care | Payer: BC Managed Care – PPO | Source: Ambulatory Visit | Attending: Urology | Admitting: Urology

## 2020-02-21 ENCOUNTER — Encounter (HOSPITAL_COMMUNITY)
Admission: RE | Admit: 2020-02-21 | Discharge: 2020-02-21 | Disposition: A | Discharge status: Home / Self Care | Payer: BC Managed Care – PPO | Source: Ambulatory Visit | Attending: Urology | Admitting: Urology

## 2020-02-21 ENCOUNTER — Other Ambulatory Visit: Payer: Self-pay

## 2020-02-21 DIAGNOSIS — G4730 Sleep apnea, unspecified: Secondary | ICD-10-CM | POA: Diagnosis not present

## 2020-02-21 DIAGNOSIS — E785 Hyperlipidemia, unspecified: Secondary | ICD-10-CM | POA: Diagnosis not present

## 2020-02-21 DIAGNOSIS — E669 Obesity, unspecified: Secondary | ICD-10-CM | POA: Diagnosis not present

## 2020-02-21 DIAGNOSIS — Z6836 Body mass index (BMI) 36.0-36.9, adult: Secondary | ICD-10-CM | POA: Diagnosis not present

## 2020-02-21 DIAGNOSIS — Z8546 Personal history of malignant neoplasm of prostate: Secondary | ICD-10-CM | POA: Diagnosis not present

## 2020-02-21 DIAGNOSIS — Z87442 Personal history of urinary calculi: Secondary | ICD-10-CM | POA: Diagnosis not present

## 2020-02-21 DIAGNOSIS — Z20822 Contact with and (suspected) exposure to covid-19: Secondary | ICD-10-CM | POA: Diagnosis not present

## 2020-02-21 DIAGNOSIS — N200 Calculus of kidney: Secondary | ICD-10-CM | POA: Diagnosis not present

## 2020-02-21 DIAGNOSIS — I10 Essential (primary) hypertension: Secondary | ICD-10-CM | POA: Diagnosis not present

## 2020-02-21 DIAGNOSIS — Z01812 Encounter for preprocedural laboratory examination: Secondary | ICD-10-CM | POA: Insufficient documentation

## 2020-02-21 DIAGNOSIS — K219 Gastro-esophageal reflux disease without esophagitis: Secondary | ICD-10-CM | POA: Diagnosis not present

## 2020-02-21 DIAGNOSIS — Z87891 Personal history of nicotine dependence: Secondary | ICD-10-CM | POA: Diagnosis not present

## 2020-02-21 DIAGNOSIS — Z960 Presence of urogenital implants: Secondary | ICD-10-CM | POA: Diagnosis not present

## 2020-02-21 LAB — BASIC METABOLIC PANEL
Anion gap: 8 (ref 5–15)
BUN: 18 mg/dL (ref 6–20)
CO2: 25 mmol/L (ref 22–32)
Calcium: 9.2 mg/dL (ref 8.9–10.3)
Chloride: 108 mmol/L (ref 98–111)
Creatinine, Ser: 1.41 mg/dL — ABNORMAL HIGH (ref 0.61–1.24)
GFR calc Af Amer: >60 mL/min (ref >60)
GFR calc non Af Amer: 54 mL/min — ABNORMAL LOW (ref >60)
Glucose, Bld: 113 mg/dL — ABNORMAL HIGH (ref 70–99)
Potassium: 4.3 mmol/L (ref 3.5–5.1)
Sodium: 141 mmol/L (ref 135–145)

## 2020-02-21 LAB — CBC
HCT: 49.7 % (ref 39.0–52.0)
Hemoglobin: 16.3 g/dL (ref 13.0–17.0)
MCH: 28.6 pg (ref 26.0–34.0)
MCHC: 32.8 g/dL (ref 30.0–36.0)
MCV: 87.3 fL (ref 80.0–100.0)
Platelets: 262 10*3/uL (ref 150–400)
RBC: 5.69 MIL/uL (ref 4.22–5.81)
RDW: 12.3 % (ref 11.5–15.5)
WBC: 5.2 10*3/uL (ref 4.0–10.5)
nRBC: 0 % (ref 0.0–0.2)

## 2020-02-21 LAB — SARS CORONAVIRUS 2 (TAT 6-24 HRS): SARS Coronavirus 2: NEGATIVE

## 2020-02-23 NOTE — Progress Notes (Signed)
Patient calls at 5 PM to inform writer he has N/V and diarrhea. RN instructed him to reduce back to clear liquids and inform his MD of this. Patient has isolated since covid test 02/21/20. He states his brother had similar symptoms last week. No elevated temp or cough. He is instructed to see how he feels in the AM and call.

## 2020-02-24 ENCOUNTER — Ambulatory Visit (HOSPITAL_COMMUNITY): Payer: BC Managed Care – PPO | Admitting: Anesthesiology

## 2020-02-24 ENCOUNTER — Encounter (HOSPITAL_COMMUNITY)
Admission: RE | Disposition: A | Discharge status: Home / Self Care | Payer: Self-pay | Source: Home / Self Care | Attending: Urology

## 2020-02-24 ENCOUNTER — Ambulatory Visit (HOSPITAL_COMMUNITY)
Admission: RE | Admit: 2020-02-24 | Discharge: 2020-02-24 | Disposition: A | Discharge status: Home / Self Care | Payer: BC Managed Care – PPO | Attending: Urology | Admitting: Urology

## 2020-02-24 ENCOUNTER — Encounter (HOSPITAL_COMMUNITY): Payer: Self-pay | Admitting: Urology

## 2020-02-24 ENCOUNTER — Ambulatory Visit (HOSPITAL_COMMUNITY): Payer: BC Managed Care – PPO

## 2020-02-24 DIAGNOSIS — Z6836 Body mass index (BMI) 36.0-36.9, adult: Secondary | ICD-10-CM | POA: Insufficient documentation

## 2020-02-24 DIAGNOSIS — K219 Gastro-esophageal reflux disease without esophagitis: Secondary | ICD-10-CM | POA: Insufficient documentation

## 2020-02-24 DIAGNOSIS — N200 Calculus of kidney: Secondary | ICD-10-CM | POA: Insufficient documentation

## 2020-02-24 DIAGNOSIS — E785 Hyperlipidemia, unspecified: Secondary | ICD-10-CM | POA: Diagnosis not present

## 2020-02-24 DIAGNOSIS — G4730 Sleep apnea, unspecified: Secondary | ICD-10-CM | POA: Diagnosis not present

## 2020-02-24 DIAGNOSIS — Z87442 Personal history of urinary calculi: Secondary | ICD-10-CM | POA: Insufficient documentation

## 2020-02-24 DIAGNOSIS — Z87891 Personal history of nicotine dependence: Secondary | ICD-10-CM | POA: Insufficient documentation

## 2020-02-24 DIAGNOSIS — E669 Obesity, unspecified: Secondary | ICD-10-CM | POA: Diagnosis not present

## 2020-02-24 DIAGNOSIS — Z960 Presence of urogenital implants: Secondary | ICD-10-CM | POA: Insufficient documentation

## 2020-02-24 DIAGNOSIS — Z8546 Personal history of malignant neoplasm of prostate: Secondary | ICD-10-CM | POA: Diagnosis not present

## 2020-02-24 DIAGNOSIS — I10 Essential (primary) hypertension: Secondary | ICD-10-CM | POA: Diagnosis not present

## 2020-02-24 HISTORY — PX: HOLMIUM LASER APPLICATION: SHX5852

## 2020-02-24 HISTORY — PX: CYSTOSCOPY W/ URETERAL STENT REMOVAL: SHX1430

## 2020-02-24 HISTORY — PX: CYSTOSCOPY WITH RETROGRADE PYELOGRAM, URETEROSCOPY AND STENT PLACEMENT: SHX5789

## 2020-02-24 SURGERY — REMOVAL, STENT, URETER, CYSTOSCOPIC
Anesthesia: General | Site: Ureter | Laterality: Right

## 2020-02-24 MED ORDER — EPHEDRINE 5 MG/ML INJ
INTRAVENOUS | Status: AC
  Filled 2020-02-24: qty 10

## 2020-02-24 MED ORDER — ONDANSETRON HCL 4 MG/2ML IJ SOLN
INTRAMUSCULAR | Status: DC | PRN
  Administered 2020-02-24: 4 mg via INTRAVENOUS

## 2020-02-24 MED ORDER — LACTATED RINGERS IV SOLN: INTRAVENOUS | Status: DC

## 2020-02-24 MED ORDER — NITROFURANTOIN MONOHYD MACRO 100 MG PO CAPS
100.0000 mg | ORAL_CAPSULE | Freq: Two times a day (BID) | ORAL | 0 refills | Status: AC
Start: 2020-02-24 — End: 2020-03-02

## 2020-02-24 MED ORDER — PROPOFOL 10 MG/ML IV BOLUS
INTRAVENOUS | Status: DC | PRN
  Administered 2020-02-24: 200 mg via INTRAVENOUS

## 2020-02-24 MED ORDER — OXYCODONE HCL 5 MG PO TABS: 5.0000 mg | ORAL_TABLET | Freq: Once | ORAL | Status: DC | PRN

## 2020-02-24 MED ORDER — CIPROFLOXACIN IN D5W 400 MG/200ML IV SOLN
400.0000 mg | INTRAVENOUS | Status: AC
  Administered 2020-02-24: 400 mg via INTRAVENOUS
  Filled 2020-02-24: qty 200

## 2020-02-24 MED ORDER — MIDAZOLAM HCL 2 MG/2ML IJ SOLN
INTRAMUSCULAR | Status: AC
  Filled 2020-02-24: qty 2

## 2020-02-24 MED ORDER — LIDOCAINE 2% (20 MG/ML) 5 ML SYRINGE
INTRAMUSCULAR | Status: DC | PRN
  Administered 2020-02-24: 80 mg via INTRAVENOUS

## 2020-02-24 MED ORDER — OXYCODONE HCL 5 MG/5ML PO SOLN: 5.0000 mg | Freq: Once | ORAL | Status: DC | PRN

## 2020-02-24 MED ORDER — PROPOFOL 10 MG/ML IV BOLUS
INTRAVENOUS | Status: AC
  Filled 2020-02-24: qty 20

## 2020-02-24 MED ORDER — HYDROMORPHONE HCL 1 MG/ML IJ SOLN: 0.2500 mg | INTRAMUSCULAR | Status: DC | PRN

## 2020-02-24 MED ORDER — IOHEXOL 300 MG/ML  SOLN
INTRAMUSCULAR | Status: DC | PRN
  Administered 2020-02-24: 13:00:00 10 mL via URETHRAL

## 2020-02-24 MED ORDER — FENTANYL CITRATE (PF) 100 MCG/2ML IJ SOLN
INTRAMUSCULAR | Status: AC
  Filled 2020-02-24: qty 2

## 2020-02-24 MED ORDER — PHENYLEPHRINE 40 MCG/ML (10ML) SYRINGE FOR IV PUSH (FOR BLOOD PRESSURE SUPPORT)
PREFILLED_SYRINGE | INTRAVENOUS | Status: AC
  Filled 2020-02-24: qty 10

## 2020-02-24 MED ORDER — 0.9 % SODIUM CHLORIDE (POUR BTL) OPTIME
TOPICAL | Status: DC | PRN
  Administered 2020-02-24: 13:00:00 1000 mL

## 2020-02-24 MED ORDER — ACETAMINOPHEN 500 MG PO TABS
1000.0000 mg | ORAL_TABLET | Freq: Once | ORAL | Status: AC
  Administered 2020-02-24: 1000 mg via ORAL
  Filled 2020-02-24: qty 2

## 2020-02-24 MED ORDER — PHENYLEPHRINE 40 MCG/ML (10ML) SYRINGE FOR IV PUSH (FOR BLOOD PRESSURE SUPPORT)
PREFILLED_SYRINGE | INTRAVENOUS | Status: AC
  Filled 2020-02-24: qty 20

## 2020-02-24 MED ORDER — PROMETHAZINE HCL 25 MG/ML IJ SOLN: 6.2500 mg | INTRAMUSCULAR | Status: DC | PRN

## 2020-02-24 MED ORDER — FENTANYL CITRATE (PF) 100 MCG/2ML IJ SOLN
INTRAMUSCULAR | Status: DC | PRN
  Administered 2020-02-24 (×2): 50 ug via INTRAVENOUS
  Administered 2020-02-24: 100 ug via INTRAVENOUS

## 2020-02-24 MED ORDER — PHENYLEPHRINE HCL (PRESSORS) 10 MG/ML IV SOLN
INTRAVENOUS | Status: DC | PRN
  Administered 2020-02-24: 80 ug via INTRAVENOUS
  Administered 2020-02-24: 120 ug via INTRAVENOUS
  Administered 2020-02-24: 80 ug via INTRAVENOUS
  Administered 2020-02-24 (×2): 120 ug via INTRAVENOUS

## 2020-02-24 MED ORDER — EPHEDRINE SULFATE-NACL 50-0.9 MG/10ML-% IV SOSY
PREFILLED_SYRINGE | INTRAVENOUS | Status: DC | PRN
  Administered 2020-02-24 (×4): 10 mg via INTRAVENOUS

## 2020-02-24 MED ORDER — SODIUM CHLORIDE 0.9 % IR SOLN
Status: DC | PRN
  Administered 2020-02-24: 3000 mL via INTRAVESICAL

## 2020-02-24 MED ORDER — MIDAZOLAM HCL 5 MG/5ML IJ SOLN
INTRAMUSCULAR | Status: DC | PRN
  Administered 2020-02-24: 2 mg via INTRAVENOUS

## 2020-02-24 MED ORDER — LIDOCAINE 2% (20 MG/ML) 5 ML SYRINGE
INTRAMUSCULAR | Status: AC
  Filled 2020-02-24: qty 5

## 2020-02-24 MED ORDER — ONDANSETRON HCL 4 MG/2ML IJ SOLN
INTRAMUSCULAR | Status: AC
  Filled 2020-02-24: qty 2

## 2020-02-24 SURGICAL SUPPLY — 24 items
BAG URO CATCHER STRL LF (MISCELLANEOUS) ×2 IMPLANT
CATH INTERMIT  6FR 70CM (CATHETERS) IMPLANT
CLOTH BEACON ORANGE TIMEOUT ST (SAFETY) ×2 IMPLANT
COVER SURGICAL LIGHT HANDLE (MISCELLANEOUS) ×2 IMPLANT
COVER WAND RF STERILE (DRAPES) IMPLANT
EXTRACTOR STONE NITINOL NGAGE (UROLOGICAL SUPPLIES) ×2 IMPLANT
FIBER LASER FLEXIVA 365 (UROLOGICAL SUPPLIES) IMPLANT
FIBER LASER TRAC TIP (UROLOGICAL SUPPLIES) ×2 IMPLANT
GLOVE BIOGEL M 8.0 STRL (GLOVE) ×2 IMPLANT
GOWN STRL REUS W/TWL XL LVL3 (GOWN DISPOSABLE) ×2 IMPLANT
GUIDEWIRE ANG ZIPWIRE 038X150 (WIRE) IMPLANT
GUIDEWIRE STR DUAL SENSOR (WIRE) ×4 IMPLANT
IV NS 1000ML (IV SOLUTION) ×2
IV NS 1000ML BAXH (IV SOLUTION) ×1 IMPLANT
KIT TURNOVER KIT A (KITS) IMPLANT
MANIFOLD NEPTUNE II (INSTRUMENTS) ×2 IMPLANT
PENCIL SMOKE EVACUATOR (MISCELLANEOUS) IMPLANT
SHEATH URETERAL 12FRX28CM (UROLOGICAL SUPPLIES) ×2 IMPLANT
SHEATH URETERAL 12FRX35CM (MISCELLANEOUS) IMPLANT
SHEATH URETERAL 12FRX55CM (UROLOGICAL SUPPLIES) IMPLANT
STENT URET 6FRX26 CONTOUR (STENTS) ×2 IMPLANT
TRAY CYSTO PACK (CUSTOM PROCEDURE TRAY) ×2 IMPLANT
TUBING CONNECTING 10 (TUBING) ×2 IMPLANT
TUBING UROLOGY SET (TUBING) ×2 IMPLANT

## 2020-02-24 NOTE — Op Note (Addendum)
Preoperative Diagnosis: Right nephrolithiasis  Postoperative Diagnosis:  Same  Procedure(s) Performed:   - Cystourethroscopy - Right ureteral stent removal  - Right ureteroscopic stone extraction with laser lithotripsy, dusting and basket extraction - Right retrograde pyelogram - Right ureteral stent placement - Intraoperative fluoroscopy with interpretation <1hr.   Surgeon:  Dr. Franchot Gallo, MD  Resident Surgeon:  Sharlot Gowda, MD  Assistant(s):  None  Anesthesia:  General  Fluids:  See anesthesia record  Estimated blood loss:  0cc  Specimens:  Stone for analysis  Drains:  Right 6Fr JJ ureteral stent with dangler  Complications:  None  Indications: 60 y.o. patient with a history of pT2 prostate cancer and nephrolithiasis; recently presented with right flank pain and concern for infection; right JJ ureteral stent placed at that time. Now presents for ureteroscopic stone extraction. Risks & benefits of the procedure discussed with the patient, who wishes to proceed.  Findings:   - Normal cystourethroscopy with right ureteral stent emanating from ureteral orifice with minimal encrustation - Uncomplicated right ureteral stent removal - Use of 12/14Fr ureteral access sheath - Soft appearing renal pelvis stone - Stone completely dusted down to two small fragments which were basket extracted - Uncomplicated right ureteral stent replacement; with string  Radiologic Interpretation of Retrograde Pyelogram: Right retrograde pyelogram performed with 10cc 50% Omnipaque from the UPJ which demonstrated no contrast extravasation, filling defects  Description:  The patient was correctly identified in the preop holding area where written informed consent as well potential risk and complication reviewed. The patient agreed. They were brought back to the operative suite where a preinduction timeout was performed. Once correct information was verified, general anesthesia was induced. They  were then gently placed into dorsal lithotomy position with SCDs in place for VTE prophylaxis. They were prepped and draped in the usual sterile fashion and given appropriate preoperative antibiotics. A second timeout was then performed.   We inserted a 58F rigid cystoscope per urethra with copious lubrication and normal saline irrigation running. This demonstrated findings as described above. We turned our attention to the right ureteral orifice with a ureteral stent emanating with minimal encrustation. We grasped the distal end of the ureteral stent and brought it to the ureteral orifice with the flexible graspers. A sensor wire was then advanced through the ureteral stent under fluoroscopic guidance and an appropriate curl was noted in the expected location of the renal pelvis. We then removed the cystoscope leaving our sensor wire in place.  A 12/14Fr x 35cm ureteral access sheath was placed over the wire, inner canula removed, a second sensor wire was placed under fluoroscopic guidance through the outer cannula.  The outer cannula was subsequently removed and then the entire ureteral access sheath was placed over one of the wires under direct fluoroscopic guidance.  The inner cannula and sensor wire was removed leaving 1 wire externalized from the ureteral access sheath to serve as a safety wire.  Pan pyeloscopy was performed with flexible dual channel ureteroscope which demonstrated a mildly dilated collecting system with a soft appearing stone in the renal pelvis near an interpolar calyx.  The stone was positioned into the interpolar calyx. Wethen obtained a 200 micron holmium laser fiber and performed laser lithotripsy until all of the stone burden was fragmented into dust/sand-like particles except for 2 fragments which were basket extracted without complication. To ensure adequate specimen sample, additional dust was aspirated using a emptied 10 cc syringe. Stone fragments were sent for chemical  analysis. We then  re-explored the location of stone treatment which demonstrated only stone dust and no residual fragments. Retrograde pyelogram was performed from the level of the UPJ through the ureteroscope with findings as demonstrated above, no contrast extravasation or filling defects.  Pullback ureteroscopy demonstrated no injury to the ureter from the ureteral access sheath and no stone fragments within the ureter. At this point we elected to leave a ureteral stent and withdrew our instruments leaving a sensor wire in place. We then advanced a 6Fr JJ ureteral stent with dangler with the assistance of a stent pusher under direct fluoroscopic & visual guidance  without difficultly. Sensor wire removal demonstrated satisfactory stent curl proximally in the renal pelvis and distally in the bladder. The bladder was emptied and all instrumentation was removed. The stent string was then secured to the shaft. The patient was woken up from anesthesia and taken to the recovery unit for routine postoperative care.   Post Op Plan:   1. Stent on string. Remove stent on 02/28/20 as instructed.   Attestation:  Dr. Diona Fanti was present and scrubbed for the entirety of the procedure.

## 2020-02-24 NOTE — Interval H&P Note (Signed)
History and Physical Interval Note:  02/24/2020 12:16 PM  Anthony Anderson  has presented today for surgery, with the diagnosis of RIGHT RENAL CALCULI.  The various methods of treatment have been discussed with the patient and family. After consideration of risks, benefits and other options for treatment, the patient has consented to  Procedure(s) with comments: CYSTOSCOPY WITH STENT REMOVAL (Right) - 1 HR CYSTOSCOPY WITH RETROGRADE PYELOGRAM, URETEROSCOPY AND STENT PLACEMENT (Right) HOLMIUM LASER APPLICATION (Right) as a surgical intervention.  The patient's history has been reviewed, patient examined, no change in status, stable for surgery.  I have reviewed the patient's chart and labs.  Questions were answered to the patient's satisfaction.     Lillette Boxer [name]

## 2020-02-24 NOTE — Anesthesia Postprocedure Evaluation (Signed)
Anesthesia Post Note  Patient: Anthony Anderson  Procedure(s) Performed: CYSTOSCOPY WITH STENT REMOVAL (Right Ureter) CYSTOSCOPY WITH RETROGRADE PYELOGRAM, URETEROSCOPY AND STENT PLACEMENT (Right Ureter) HOLMIUM LASER APPLICATION (Right Ureter)     Patient location during evaluation: PACU Anesthesia Type: General Level of consciousness: awake and alert, oriented and patient cooperative Pain management: pain level controlled Vital Signs Assessment: post-procedure vital signs reviewed and stable Respiratory status: spontaneous breathing, nonlabored ventilation and respiratory function stable Cardiovascular status: blood pressure returned to baseline and stable Postop Assessment: no apparent nausea or vomiting Anesthetic complications: no    Last Vitals:  Vitals:   02/24/20 1345 02/24/20 1400  BP: (!) 145/88 (!) 142/71  Pulse: (!) 103 99  Resp: (!) 21 18  Temp:  36.6 C  SpO2: 98% 94%    Last Pain:  Vitals:   02/24/20 1400  TempSrc:   PainSc: [place]

## 2020-02-24 NOTE — Anesthesia Procedure Notes (Signed)
Procedure Name: LMA Insertion Date/Time: 02/24/2020 12:25 PM Performed by: Maxwell Caul, CRNA Pre-anesthesia Checklist: Patient identified, Emergency Drugs available, Suction available and Patient being monitored Patient Re-evaluated:Patient Re-evaluated prior to induction Oxygen Delivery Method: Circle system utilized Preoxygenation: Pre-oxygenation with 100% oxygen Induction Type: IV induction LMA: LMA inserted LMA Size: 4.0 Number of attempts: 1 Placement Confirmation: positive ETCO2 and breath sounds checked- equal and bilateral Tube secured with: Tape Dental Injury: Teeth and Oropharynx as per pre-operative assessment

## 2020-02-24 NOTE — Transfer of Care (Signed)
Immediate Anesthesia Transfer of Care Note  Patient: Anthony Anderson  Procedure(s) Performed: CYSTOSCOPY WITH STENT REMOVAL (Right Ureter) CYSTOSCOPY WITH RETROGRADE PYELOGRAM, URETEROSCOPY AND STENT PLACEMENT (Right Ureter) HOLMIUM LASER APPLICATION (Right Ureter)  Patient Location: PACU  Anesthesia Type:General  Level of Consciousness: awake, alert  and oriented  Airway & Oxygen Therapy: Patient Spontanous Breathing and Patient connected to face mask oxygen  Post-op Assessment: Report given to RN and Post -op Vital signs reviewed and stable  Post vital signs: Reviewed and stable  Last Vitals:  Vitals Value Taken Time  BP 137/71 02/24/20 1330  Temp    Pulse 112 02/24/20 1330  Resp 22 02/24/20 1330  SpO2 96 % 02/24/20 1330  Vitals shown include unvalidated device data.  Last Pain:  Vitals:   02/24/20 1057  TempSrc:   PainSc: 2       Patients Stated Pain Goal: 1 ([id number])  Complications: No apparent anesthesia complications

## 2020-02-24 NOTE — Anesthesia Preprocedure Evaluation (Addendum)
Anesthesia Evaluation  Patient identified by MRN, date of birth, ID band Patient awake    Reviewed: Allergy & Precautions, NPO status , Patient's Chart, lab work & pertinent test results, reviewed documented beta blocker date and time   History of Anesthesia Complications Negative for: history of anesthetic complications  Airway Mallampati: III  TM Distance: >3 FB Neck ROM: Full    Dental  (+) Partial Upper, Dental Advisory Given,    Pulmonary neg shortness of breath, sleep apnea and Continuous Positive Airway Pressure Ventilation , COPD,  COPD inhaler, former smoker,  Quit smoking 1983, 10 pack year history  hasn't used albuterol in >1 year   breath sounds clear to auscultation       Cardiovascular hypertension, Pt. on medications and Pt. on home beta blockers (-) angina Rhythm:Regular Rate:Normal  Last echo 07/2019: 1. Left ventricular ejection fraction, by visual estimation, is 60 to  65%. The left ventricle has normal function. Normal left ventricular size.  There is no left ventricular hypertrophy.  2. Global right ventricle has normal systolic function.The right  ventricular size is normal. No increase in right ventricular wall  thickness.  3. Left atrial size was normal.  4. Right atrial size was normal.  5. The mitral valve is normal in structure. No evidence of mitral valve  regurgitation. No evidence of mitral stenosis.  6. The tricuspid valve is normal in structure. Tricuspid valve  regurgitation was not visualized by color flow Doppler.  7. The aortic valve is normal in structure. Aortic valve regurgitation  was not visualized by color flow Doppler. Structurally normal aortic  valve, with no evidence of sclerosis or stenosis.  8. The pulmonic valve was normal in structure. Pulmonic valve  regurgitation is not visualized by color flow Doppler.  9. The inferior vena cava is normal in size with greater than  50%  respiratory variability, suggesting right atrial pressure of 3 mmHg.   HLD   Neuro/Psych negative neurological ROS  negative psych ROS   GI/Hepatic Neg liver ROS, hiatal hernia, GERD  Medicated and Controlled,  Endo/Other  Obesity BMI 37  Renal/GU Renal InsufficiencyRenal disease (creat 2.12)H/o stones, right renal calculus    Prostate cancer    Musculoskeletal negative musculoskeletal ROS (+)   Abdominal (+) + obese,   Peds  Hematology negative hematology ROS (+)   Anesthesia Other Findings   Reproductive/Obstetrics negative OB ROS                            Anesthesia Physical Anesthesia Plan  ASA: III  Anesthesia Plan: General   Post-op Pain Management:    Induction: Intravenous  PONV Risk Score and Plan: 2 and Ondansetron, Dexamethasone and Treatment may vary due to age or medical condition  Airway Management Planned: LMA  Additional Equipment: None  Intra-op Plan:   Post-operative Plan: Extubation in OR  Informed Consent: I have reviewed the patients History and Physical, chart, labs and discussed the procedure including the risks, benefits and alternatives for the proposed anesthesia with the patient or authorized representative who has indicated his/her understanding and acceptance.     Dental advisory given  Plan Discussed with: CRNA  Anesthesia Plan Comments:         Anesthesia Quick Evaluation

## 2020-02-24 NOTE — H&P (Signed)
H&P  Chief Complaint: Kidney stone  History of Present Illness: 59 year old male presents at this time for definitive management of a right renal pelvic stone, 12 mm in size.  Initial presentation on 02/07/2020-right flank pain, pyuria, shakes and chills.  He underwent urgent stenting.  He was treated appropriately for ESBL producing E. coli.  He presents at this time, with his infection having been treated, for definitive ureteroscopic management.  Past Medical History:  Diagnosis Date  . Cancer [facility])    prostate, currently watching for any changes  . Cholelithiasis   . Dyspnea 2015  . Dysrhythmia   . GERD (gastroesophageal reflux disease)   . History of hiatal hernia   . History of kidney stones   . Hyperlipidemia   . Hypertension   . Kidney stones   . PSA elevation   . Sleep apnea    uses CPAP    Past Surgical History:  Procedure Laterality Date  . CARDIAC CATHETERIZATION    . CYSTOSCOPY WITH STENT PLACEMENT Right 02/07/2020   Procedure: CYSTOSCOPY RIGHT RETROGRADE PYLEOGRAM WITH RIGHT STENT PLACEMENT;  Surgeon: Robley Fries, MD;  Location: [facility] ORS;  Service: Urology;  Laterality: Right;  . LEFT HEART CATHETERIZATION WITH CORONARY ANGIOGRAM N/A 10/26/2014   Procedure: LEFT HEART CATHETERIZATION WITH CORONARY ANGIOGRAM;  Surgeon: Sinclair Grooms, MD;  Location: [facility] CATH LAB;  Service: Cardiovascular;  Laterality: N/A;  . LITHOTRIPSY    . MASS EXCISION Left 01/09/2018   Procedure: EXCISION SUBCUTANEOUS MASS, CHEST WALL;  Surgeon: Aviva Signs, MD;  Location: [facility] ORS;  Service: General;  Laterality: Left;  . NASAL SINUS SURGERY    . PELVIC LYMPH NODE DISSECTION Bilateral 09/11/2018   Procedure: PELVIC LYMPH NODE DISSECTION;  Surgeon: Ardis Hughs, MD;  Location: [facility]S;  Service: Urology;  Laterality: Bilateral;  . ROBOT ASSISTED LAPAROSCOPIC RADICAL PROSTATECTOMY N/A 09/11/2018   Procedure: XI ROBOTIC ASSISTED LAPAROSCOPIC RADICAL PROSTATECTOMY;  Surgeon: Ardis Hughs, MD;  Location: [facility]S;  Service: Urology;  Laterality: N/A;    Home Medications:  Allergies as of 02/24/2020   No Known Allergies     Medication List    Notice   Cannot display discharge medications because the patient has not yet been admitted.     Allergies: No Known Allergies  Family History  Problem Relation Age of Onset  . CAD Mother   . CAD Father   . Prostate cancer Father     Social History:  reports that he quit smoking about 37 years ago. His smoking use included cigarettes. He has a 10.00 pack-year smoking history. He has never used smokeless tobacco. He reports that he does not drink alcohol or use drugs.  ROS: A complete review of systems was performed.  All systems are negative except for pertinent findings as noted.  Physical Exam:  Vital signs in last 24 hours: There were no vitals taken for this visit. Constitutional:  Alert and oriented, No acute distress Cardiovascular: Regular rate  Respiratory: Normal respiratory effort GI: Abdomen is soft, nontender, nondistended, no abdominal masses. No CVAT.  Genitourinary: Normal male phallus, testes are descended bilaterally and non-tender and without masses, scrotum is normal in appearance without lesions or masses, perineum is normal on inspection. Lymphatic: No lymphadenopathy Neurologic: Grossly intact, no focal deficits Psychiatric: Normal mood and affect  Laboratory Data:  No results for input(s): WBC, HGB, HCT, PLT in the last 72 hours.  No results for input(s): NA, K, CL, GLUCOSE, BUN, CALCIUM, CREATININE in  the last 72 hours.  Invalid input(s): CO3   No results found for this or any previous visit (from the past 24 hour(s)). Recent Results (from the past 240 hour(s))  SARS CORONAVIRUS 2 (TAT 6-24 HRS) Nasopharyngeal Nasopharyngeal Swab     Status: None   Collection Time: 02/21/20 10:00 AM   Specimen: Nasopharyngeal Swab  Result Value Ref Range Status   SARS Coronavirus 2 NEGATIVE  NEGATIVE Final    Comment: (NOTE) SARS-CoV-2 target nucleic acids are NOT DETECTED. The SARS-CoV-2 RNA is generally detectable in upper and lower respiratory specimens during the acute phase of infection. Negative results do not preclude SARS-CoV-2 infection, do not rule out co-infections with other pathogens, and should not be used as the sole basis for treatment or other patient management decisions. Negative results must be combined with clinical observations, patient history, and epidemiological information. The expected result is Negative. Fact Sheet for Patients: SugarRoll.be Fact Sheet for Healthcare Providers: https://www.woods-mathews.com/ This test is not yet approved or cleared by the [place] FDA and  has been authorized for detection and/or diagnosis of SARS-CoV-2 by FDA under an Emergency Use Authorization (EUA). This EUA will remain  in effect (meaning this test can be used) for the duration of the COVID-19 declaration under Section 56 4(b)(1) of the Act, 21 U.S.C. section 360bbb-3(b)(1), unless the authorization is terminated or revoked sooner. Performed at [facility], [place] [address] [place], [place] [address]     Renal Function: Recent Labs    02/21/20 [id number]  CREATININE 1.41*   Estimated Creatinine Clearance: 71.8 mL/min (A) (by C-G formula based on SCr of 1.41 mg/dL (H)).  Radiologic Imaging: No results found.  Impression/Assessment:  Right renal pelvic stone, status post antibiotic management of infection and stenting  Plan:  Cystoscopy, double-J stent extraction, right ureteroscopy, holmium laser lithotripsy and extraction of right renal pelvic stone, stent replacement

## 2020-02-24 NOTE — Discharge Instructions (Signed)
1. You may see some blood in the urine and may have some burning with urination for 48-72 hours. You also may notice that you have to urinate more frequently or urgently after your procedure which is normal.  2. You should call should you develop an inability urinate, fever > 101, persistent nausea and vomiting that prevents you from eating or drinking to stay hydrated.  3. If you have a stent, you will likely urinate more frequently and urgently until the stent is removed and you may experience some discomfort/pain in the lower abdomen and flank especially when urinating. You may take pain medication prescribed to you if needed for pain. You may also intermittently have blood in the urine until the stent is removed. OK to pull string to remove stent on Monday morning

## 2020-03-02 DIAGNOSIS — N200 Calculus of kidney: Secondary | ICD-10-CM | POA: Diagnosis not present

## 2020-03-02 DIAGNOSIS — C61 Malignant neoplasm of prostate: Secondary | ICD-10-CM | POA: Diagnosis not present

## 2020-04-19 DIAGNOSIS — N5231 Erectile dysfunction following radical prostatectomy: Secondary | ICD-10-CM | POA: Diagnosis not present

## 2020-04-19 DIAGNOSIS — N281 Cyst of kidney, acquired: Secondary | ICD-10-CM | POA: Diagnosis not present

## 2020-04-19 DIAGNOSIS — N200 Calculus of kidney: Secondary | ICD-10-CM | POA: Diagnosis not present

## 2020-04-19 DIAGNOSIS — C61 Malignant neoplasm of prostate: Secondary | ICD-10-CM | POA: Diagnosis not present

## 2020-04-20 ENCOUNTER — Other Ambulatory Visit: Payer: Self-pay | Admitting: Urology

## 2020-04-20 DIAGNOSIS — N281 Cyst of kidney, acquired: Secondary | ICD-10-CM

## 2020-05-15 ENCOUNTER — Other Ambulatory Visit: Payer: Self-pay

## 2020-05-15 ENCOUNTER — Ambulatory Visit
Admission: RE | Admit: 2020-05-15 | Discharge: 2020-05-15 | Disposition: A | Discharge status: Home / Self Care | Payer: BC Managed Care – PPO | Source: Ambulatory Visit | Attending: Urology | Admitting: Urology

## 2020-05-15 DIAGNOSIS — K760 Fatty (change of) liver, not elsewhere classified: Secondary | ICD-10-CM | POA: Diagnosis not present

## 2020-05-15 DIAGNOSIS — N2889 Other specified disorders of kidney and ureter: Secondary | ICD-10-CM | POA: Diagnosis not present

## 2020-05-15 DIAGNOSIS — K828 Other specified diseases of gallbladder: Secondary | ICD-10-CM | POA: Diagnosis not present

## 2020-05-15 DIAGNOSIS — N281 Cyst of kidney, acquired: Secondary | ICD-10-CM

## 2020-05-15 DIAGNOSIS — K449 Diaphragmatic hernia without obstruction or gangrene: Secondary | ICD-10-CM | POA: Diagnosis not present

## 2020-05-15 MED ORDER — GADOBENATE DIMEGLUMINE 529 MG/ML IV SOLN
20.0000 mL | Freq: Once | INTRAVENOUS | Status: AC | PRN
  Administered 2020-05-15: 20 mL via INTRAVENOUS

## 2020-05-22 DIAGNOSIS — N5231 Erectile dysfunction following radical prostatectomy: Secondary | ICD-10-CM | POA: Diagnosis not present

## 2020-08-07 DIAGNOSIS — C61 Malignant neoplasm of prostate: Secondary | ICD-10-CM | POA: Diagnosis not present

## 2020-08-07 DIAGNOSIS — E662 Morbid (severe) obesity with alveolar hypoventilation: Secondary | ICD-10-CM | POA: Diagnosis not present

## 2020-08-07 DIAGNOSIS — E782 Mixed hyperlipidemia: Secondary | ICD-10-CM | POA: Diagnosis not present

## 2020-08-07 DIAGNOSIS — E6609 Other obesity due to excess calories: Secondary | ICD-10-CM | POA: Diagnosis not present

## 2020-08-07 DIAGNOSIS — I129 Hypertensive chronic kidney disease with stage 1 through stage 4 chronic kidney disease, or unspecified chronic kidney disease: Secondary | ICD-10-CM | POA: Diagnosis not present

## 2020-08-14 DIAGNOSIS — Z0001 Encounter for general adult medical examination with abnormal findings: Secondary | ICD-10-CM | POA: Diagnosis not present

## 2020-08-21 DIAGNOSIS — N5201 Erectile dysfunction due to arterial insufficiency: Secondary | ICD-10-CM | POA: Diagnosis not present

## 2020-08-21 DIAGNOSIS — C61 Malignant neoplasm of prostate: Secondary | ICD-10-CM | POA: Diagnosis not present

## 2020-08-21 DIAGNOSIS — N200 Calculus of kidney: Secondary | ICD-10-CM | POA: Diagnosis not present

## 2020-09-20 DIAGNOSIS — G4733 Obstructive sleep apnea (adult) (pediatric): Secondary | ICD-10-CM | POA: Diagnosis not present

## 2020-11-22 DIAGNOSIS — M10071 Idiopathic gout, right ankle and foot: Secondary | ICD-10-CM | POA: Diagnosis not present

## 2021-02-19 DIAGNOSIS — D485 Neoplasm of uncertain behavior of skin: Secondary | ICD-10-CM | POA: Diagnosis not present

## 2021-02-19 DIAGNOSIS — Z1283 Encounter for screening for malignant neoplasm of skin: Secondary | ICD-10-CM | POA: Diagnosis not present

## 2021-02-19 DIAGNOSIS — D2272 Melanocytic nevi of left lower limb, including hip: Secondary | ICD-10-CM | POA: Diagnosis not present

## 2021-02-19 DIAGNOSIS — D225 Melanocytic nevi of trunk: Secondary | ICD-10-CM | POA: Diagnosis not present

## 2021-03-05 DIAGNOSIS — C61 Malignant neoplasm of prostate: Secondary | ICD-10-CM | POA: Diagnosis not present

## 2021-03-12 DIAGNOSIS — N209 Urinary calculus, unspecified: Secondary | ICD-10-CM | POA: Diagnosis not present

## 2021-03-12 DIAGNOSIS — C61 Malignant neoplasm of prostate: Secondary | ICD-10-CM | POA: Diagnosis not present

## 2021-03-12 DIAGNOSIS — N5231 Erectile dysfunction following radical prostatectomy: Secondary | ICD-10-CM | POA: Diagnosis not present

## 2021-04-30 ENCOUNTER — Ambulatory Visit
Admission: EM | Admit: 2021-04-30 | Discharge: 2021-04-30 | Disposition: A | Discharge status: Home / Self Care | Payer: BC Managed Care – PPO | Attending: Family Medicine | Admitting: Family Medicine

## 2021-04-30 ENCOUNTER — Encounter: Payer: Self-pay | Admitting: Emergency Medicine

## 2021-04-30 ENCOUNTER — Other Ambulatory Visit: Payer: Self-pay

## 2021-04-30 DIAGNOSIS — S40862A Insect bite (nonvenomous) of left upper arm, initial encounter: Secondary | ICD-10-CM

## 2021-04-30 DIAGNOSIS — W57XXXA Bitten or stung by nonvenomous insect and other nonvenomous arthropods, initial encounter: Secondary | ICD-10-CM

## 2021-04-30 DIAGNOSIS — L03114 Cellulitis of left upper limb: Secondary | ICD-10-CM

## 2021-04-30 MED ORDER — DEXAMETHASONE 1 MG/ML PO CONC
10.0000 mg | Freq: Once | ORAL | Status: AC
  Administered 2021-04-30: 10 mg via ORAL

## 2021-04-30 MED ORDER — DOXYCYCLINE HYCLATE 100 MG PO CAPS
100.0000 mg | ORAL_CAPSULE | Freq: Two times a day (BID) | ORAL | 0 refills | Status: DC
Start: 2021-04-30 — End: 2022-01-31

## 2021-04-30 MED ORDER — DEXAMETHASONE 1 MG/ML PO CONC: 10.0000 mg | Freq: Once | ORAL | Status: DC

## 2021-04-30 MED ORDER — PREDNISONE 20 MG PO TABS: 20.0000 mg | ORAL_TABLET | Freq: Every day | ORAL | 0 refills | Status: AC

## 2021-04-30 MED ORDER — TRIAMCINOLONE ACETONIDE 0.025 % EX OINT: 1.0000 "application " | TOPICAL_OINTMENT | Freq: Two times a day (BID) | CUTANEOUS | 0 refills | Status: DC

## 2021-04-30 NOTE — ED Provider Notes (Signed)
[facility]    CSN: [id number] Arrival date & time: 04/30/21  1641      History   Chief Complaint No chief complaint on file.   HPI Anthony Anderson is a 60 y.o. male.   HPI Patient here for evaluation of a skin rash which has expanded since being bitten by a tick one week ago today. Patient personally removed the tick intact and initially had a small reddened area which is now developed into a rash.  He has been attempting to treat the rash with over-the-counter medication without any relief.  He grew concerned as the rash has worsened and he has increased redness at the forearm of his left extremity.   Past Medical History:  Diagnosis Date   Cancer [facility])    prostate, currently watching for any changes   Cholelithiasis    Dyspnea 2015   Dysrhythmia    GERD (gastroesophageal reflux disease)    History of hiatal hernia    History of kidney stones    Hyperlipidemia    Hypertension    Kidney stones    PSA elevation    Sleep apnea    uses CPAP    Patient Active Problem List   Diagnosis Date Noted   Ureteral calculus 02/07/2020   Prostate cancer ([place]) 09/11/2018   Lipoma of torso    Essential hypertension 10/26/2014   Hyperlipidemia 10/26/2014   GERD (gastroesophageal reflux disease) 10/26/2014   Chest pain    Shortness of breath 10/24/2014   Near syncope 10/24/2014   Dyspnea 10/24/2014    Past Surgical History:  Procedure Laterality Date   CARDIAC CATHETERIZATION     CYSTOSCOPY W/ URETERAL STENT REMOVAL Right 02/24/2020   Procedure: CYSTOSCOPY WITH STENT REMOVAL;  Surgeon: Franchot Gallo, MD;  Location: [facility];  Service: Urology;  Laterality: Right;  1 HR   CYSTOSCOPY WITH RETROGRADE PYELOGRAM, URETEROSCOPY AND STENT PLACEMENT Right 02/24/2020   Procedure: CYSTOSCOPY WITH RETROGRADE PYELOGRAM, URETEROSCOPY AND STENT PLACEMENT;  Surgeon: Franchot Gallo, MD;  Location: [facility];  Service: Urology;  Laterality: Right;   CYSTOSCOPY WITH STENT PLACEMENT  Right 02/07/2020   Procedure: CYSTOSCOPY RIGHT RETROGRADE PYLEOGRAM WITH RIGHT STENT PLACEMENT;  Surgeon: Robley Fries, MD;  Location: [facility];  Service: Urology;  Laterality: Right;   HOLMIUM LASER APPLICATION Right 1/44/8185   Procedure: HOLMIUM LASER APPLICATION;  Surgeon: Franchot Gallo, MD;  Location: [facility];  Service: Urology;  Laterality: Right;   LEFT HEART CATHETERIZATION WITH CORONARY ANGIOGRAM N/A 10/26/2014   Procedure: LEFT HEART CATHETERIZATION WITH CORONARY ANGIOGRAM;  Surgeon: Sinclair Grooms, MD;  Location: [facility] CATH LAB;  Service: Cardiovascular;  Laterality: N/A;   LITHOTRIPSY     MASS EXCISION Left 01/09/2018   Procedure: EXCISION SUBCUTANEOUS MASS, CHEST WALL;  Surgeon: Aviva Signs, MD;  Location: [facility];  Service: General;  Laterality: Left;   NASAL SINUS SURGERY     PELVIC LYMPH NODE DISSECTION Bilateral 09/11/2018   Procedure: PELVIC LYMPH NODE DISSECTION;  Surgeon: Ardis Hughs, MD;  Location: [facility];  Service: Urology;  Laterality: Bilateral;   ROBOT ASSISTED LAPAROSCOPIC RADICAL PROSTATECTOMY N/A 09/11/2018   Procedure: XI ROBOTIC ASSISTED LAPAROSCOPIC RADICAL PROSTATECTOMY;  Surgeon: Ardis Hughs, MD;  Location: [facility];  Service: Urology;  Laterality: N/A;       Home Medications    Prior to Admission medications   Medication Sig Start Date End Date Taking? Authorizing Provider  albuterol (VENTOLIN HFA) 108 (90 Base) MCG/ACT inhaler Inhale 1-2 puffs  into the lungs every 6 (six) hours as needed for wheezing or shortness of breath.  09/30/19   [provider]  Artificial Tear Solution (SOOTHE XP OP) Place 1 drop into both eyes at bedtime as needed (for dry eyes).    [provider]  aspirin EC 81 MG tablet Take 81 mg by mouth every other day.    [provider]  losartan (COZAAR) 100 MG tablet Take 100 mg by mouth daily.    [provider]  metoprolol succinate (TOPROL-XL) 25 MG 24 hr tablet Take 25 mg by  mouth at bedtime.  06/28/19   [provider]  Multiple Vitamin (MULTIVITAMIN WITH MINERALS) TABS tablet Take 1 tablet by mouth daily.    [provider]  olmesartan (BENICAR) 40 MG tablet Take 40 mg by mouth daily. 11/29/19   [provider]  ondansetron (ZOFRAN) 4 MG tablet Take 1 tablet (4 mg total) by mouth every 6 (six) hours. 02/06/20   Triplett, Tammy, PA-C  oxyCODONE-acetaminophen (PERCOCET/ROXICET) 5-325 MG tablet Take 1 tablet by mouth every 4 (four) hours as needed. 02/06/20   Triplett, Tammy, PA-C  pantoprazole (PROTONIX) 40 MG tablet Take 40 mg by mouth daily.    [provider]  rosuvastatin (CRESTOR) 10 MG tablet Take 10 mg by mouth daily.     [provider]  tamsulosin (FLOMAX) 0.4 MG CAPS capsule Take 1 capsule (0.4 mg total) by mouth daily. 02/08/20   Robley Fries, MD    Family History Family History  Problem Relation Age of Onset   CAD Mother    CAD Father    Prostate cancer Father     Social History Social History   Tobacco Use   Smoking status: Former    Packs/day: 1.00    Years: 10.00    Pack years: 10.00    Types: Cigarettes    Quit date: 09/20/1982    Years since quitting: 38.6   Smokeless tobacco: Never  Vaping Use   Vaping Use: Never used  Substance Use Topics   Alcohol use: No   Drug use: No     Allergies   Patient has no known allergies.   Review of Systems Review of Systems Pertinent negatives listed in HPI  Physical Exam Triage Vital Signs ED Triage Vitals [04/30/21 1648]  Enc Vitals Group     BP (!) 152/91     Pulse Rate 91     Resp 18     Temp 98.9 F (37.2 C)     Temp Source Oral     SpO2 95 %     Weight      Height      Head Circumference      Peak Flow      Pain Score 0     Pain Loc      Pain Edu?      Excl. in [place]?    No data found.  Updated Vital Signs BP (!) 152/91 (BP Location: Right Arm)   Pulse 91   Temp 98.9 F (37.2 C) (Oral)   Resp 18   SpO2 95%   Visual  Acuity Right Eye Distance:   Left Eye Distance:   Bilateral Distance:    Right Eye Near:   Left Eye Near:    Bilateral Near:     Physical Exam General appearance: alert, well developed, well nourished, cooperative  Head: Normocephalic, without obvious abnormality, atraumatic Respiratory: Respirations even and unlabored, normal respiratory rate Heart: Rate  and rhythm normal. No gallop or murmurs noted on exam  Extremities: left lower forearm macular papula rash with expanding erythema  Skin: Skin color, texture, turgor normal. No rashes seen  Psych: Appropriate mood and affect. Neurologic: GCS 15, normal coordination, normal gait  UC Treatments / Results  Labs (all labs ordered are listed, but only abnormal results are displayed) Labs Reviewed - No data to display  EKG   Radiology No results found.  Procedures Procedures (including critical care time)  Medications Ordered in UC Medications - No data to display  Initial Impression / Assessment and Plan / UC Course  I have reviewed the triage vital signs and the nursing notes.  Pertinent labs & imaging results that were available during my care of the patient were reviewed by me and considered in my medical decision making (see chart for details).  Cellulitis involving the left upper extremity secondary to a tick bite reaction. Patient given Decadron 10 mg here in clinic.  Patient will start prednisone 20 mg at home take daily for 5 days along with doxycycline 100 mg twice daily for 10 days and topical triamcinolone cream to apply to rash to resolve itching.  Monitor for signs of worsening infection. Return precautions given.  Final Clinical Impressions(s) / UC Diagnoses   Final diagnoses:  Cellulitis of left upper extremity  Tick bite of left upper arm, initial encounter  Insect bite of left forearm with local reaction, initial encounter   Discharge Instructions   None    ED Prescriptions     Medication Sig  Dispense Auth. Provider   doxycycline (VIBRAMYCIN) 100 MG capsule Take 1 capsule (100 mg total) by mouth 2 (two) times daily. 20 capsule Scot Jun, FNP   triamcinolone (KENALOG) 0.025 % ointment Apply 1 application topically 2 (two) times daily. 80 g Scot Jun, FNP   predniSONE (DELTASONE) 20 MG tablet Take 1 tablet (20 mg total) by mouth daily with breakfast for 5 days. 5 tablet Scot Jun, FNP      PDMP not reviewed this encounter.   Scot Jun, FNP 04/30/21 1719

## 2021-04-30 NOTE — Discharge Instructions (Addendum)
If you experience any fever, generalized body aches, or the rash worsens or doesn't  improve return for further evaluation and work-up.Anthony Anderson

## 2021-04-30 NOTE — ED Triage Notes (Signed)
Tick bite to LT arm that has redness that has spread

## 2021-06-28 DIAGNOSIS — N184 Chronic kidney disease, stage 4 (severe): Secondary | ICD-10-CM | POA: Insufficient documentation

## 2021-09-10 DIAGNOSIS — C61 Malignant neoplasm of prostate: Secondary | ICD-10-CM | POA: Diagnosis not present

## 2021-12-03 DIAGNOSIS — R059 Cough, unspecified: Secondary | ICD-10-CM | POA: Diagnosis not present

## 2021-12-03 DIAGNOSIS — R509 Fever, unspecified: Secondary | ICD-10-CM | POA: Diagnosis not present

## 2021-12-03 DIAGNOSIS — U071 COVID-19: Secondary | ICD-10-CM | POA: Diagnosis not present

## 2021-12-17 DIAGNOSIS — Z0001 Encounter for general adult medical examination with abnormal findings: Secondary | ICD-10-CM | POA: Diagnosis not present

## 2021-12-17 DIAGNOSIS — Z0000 Encounter for general adult medical examination without abnormal findings: Secondary | ICD-10-CM | POA: Diagnosis not present

## 2021-12-17 DIAGNOSIS — H6123 Impacted cerumen, bilateral: Secondary | ICD-10-CM | POA: Diagnosis not present

## 2021-12-18 ENCOUNTER — Encounter (INDEPENDENT_AMBULATORY_CARE_PROVIDER_SITE_OTHER): Payer: Self-pay | Admitting: *Deleted

## 2021-12-31 DIAGNOSIS — M109 Gout, unspecified: Secondary | ICD-10-CM | POA: Diagnosis not present

## 2021-12-31 DIAGNOSIS — I10 Essential (primary) hypertension: Secondary | ICD-10-CM | POA: Diagnosis not present

## 2021-12-31 DIAGNOSIS — K219 Gastro-esophageal reflux disease without esophagitis: Secondary | ICD-10-CM | POA: Diagnosis not present

## 2021-12-31 DIAGNOSIS — E785 Hyperlipidemia, unspecified: Secondary | ICD-10-CM | POA: Diagnosis not present

## 2022-01-09 ENCOUNTER — Encounter (INDEPENDENT_AMBULATORY_CARE_PROVIDER_SITE_OTHER): Payer: Self-pay

## 2022-01-09 ENCOUNTER — Other Ambulatory Visit (INDEPENDENT_AMBULATORY_CARE_PROVIDER_SITE_OTHER): Payer: Self-pay

## 2022-01-09 ENCOUNTER — Telehealth (INDEPENDENT_AMBULATORY_CARE_PROVIDER_SITE_OTHER): Payer: Self-pay

## 2022-01-09 DIAGNOSIS — Z1211 Encounter for screening for malignant neoplasm of colon: Secondary | ICD-10-CM

## 2022-01-09 MED ORDER — CLENPIQ 10-3.5-12 MG-GM -GM/160ML PO SOLN: 1.0000 | Freq: Once | ORAL | 0 refills | Status: AC

## 2022-01-09 NOTE — Telephone Encounter (Signed)
Anthony Anderson Ann Jeromiah Ohalloran, CMA  ?

## 2022-01-09 NOTE — Telephone Encounter (Signed)
Referring MD/PCP: [place] Crane ? ?Procedure: Tcs ? ?Reason/Indication:  Screening ? ?Has patient had this procedure before?  Yes  ? If so, when, by whom and where?  11 years ago ? ?Is there a family history of colon cancer?  no ? Who?  What age when diagnosed?   ? ?Is patient diabetic? If yes, Type 1 or Type 2   no ?     ?Does patient have prosthetic heart valve or mechanical valve?  no ? ?Do you have a pacemaker/defibrillator?  no ? ?Has patient ever had endocarditis/atrial fibrillation? no ? ?Does patient use oxygen? no ? ?Has patient had joint replacement within last 12 months?  no ? ?Is patient constipated or do they take laxatives? no ? ?Does patient have a history of alcohol/drug use?  no ? ?Have you had a stroke/heart attack last 6 mths? no ? ?Do you take medicine for weight loss?  no ? ?For male patients,: have you had a hysterectomy N/A ?                     are you post menopausal N/A ?                     do you still have your menstrual cycle N/A ? ?Is patient on blood thinner such as Coumadin, Plavix and/or Aspirin? no ? ?Medications: Olmesartan 40 mg daily, pantoprazole 40 mg daily, metoprolol 25 mg daily. Rosuvastatin 10 mg daily ? ?Allergies: nkda ? ?Medication Adjustment per Dr Jenetta Downer none ? ?Procedure date & time: 02/06/22 at 10:00 ? ? ?

## 2022-01-09 NOTE — Telephone Encounter (Signed)
Ok to schedule.  Thanks,  Demian Maisel Castaneda Mayorga, MD Gastroenterology and Hepatology [facility]  

## 2022-02-06 ENCOUNTER — Other Ambulatory Visit: Payer: Self-pay

## 2022-02-06 ENCOUNTER — Encounter (HOSPITAL_COMMUNITY): Payer: Self-pay | Admitting: Gastroenterology

## 2022-02-06 ENCOUNTER — Ambulatory Visit (HOSPITAL_COMMUNITY): Payer: BC Managed Care – PPO | Admitting: Anesthesiology

## 2022-02-06 ENCOUNTER — Encounter (INDEPENDENT_AMBULATORY_CARE_PROVIDER_SITE_OTHER): Payer: Self-pay | Admitting: *Deleted

## 2022-02-06 ENCOUNTER — Encounter (HOSPITAL_COMMUNITY)
Admission: RE | Disposition: A | Discharge status: Home / Self Care | Payer: Self-pay | Source: Home / Self Care | Attending: Gastroenterology

## 2022-02-06 ENCOUNTER — Ambulatory Visit (HOSPITAL_COMMUNITY)
Admission: RE | Admit: 2022-02-06 | Discharge: 2022-02-06 | Disposition: A | Discharge status: Home / Self Care | Payer: BC Managed Care – PPO | Attending: Gastroenterology | Admitting: Gastroenterology

## 2022-02-06 DIAGNOSIS — Z1211 Encounter for screening for malignant neoplasm of colon: Secondary | ICD-10-CM

## 2022-02-06 DIAGNOSIS — D120 Benign neoplasm of cecum: Secondary | ICD-10-CM | POA: Insufficient documentation

## 2022-02-06 DIAGNOSIS — Z8546 Personal history of malignant neoplasm of prostate: Secondary | ICD-10-CM | POA: Insufficient documentation

## 2022-02-06 DIAGNOSIS — K635 Polyp of colon: Secondary | ICD-10-CM

## 2022-02-06 DIAGNOSIS — D122 Benign neoplasm of ascending colon: Secondary | ICD-10-CM | POA: Insufficient documentation

## 2022-02-06 DIAGNOSIS — K219 Gastro-esophageal reflux disease without esophagitis: Secondary | ICD-10-CM | POA: Insufficient documentation

## 2022-02-06 DIAGNOSIS — E785 Hyperlipidemia, unspecified: Secondary | ICD-10-CM | POA: Diagnosis not present

## 2022-02-06 DIAGNOSIS — Z86010 Personal history of colonic polyps: Secondary | ICD-10-CM

## 2022-02-06 DIAGNOSIS — Z87891 Personal history of nicotine dependence: Secondary | ICD-10-CM | POA: Insufficient documentation

## 2022-02-06 DIAGNOSIS — Z87442 Personal history of urinary calculi: Secondary | ICD-10-CM | POA: Insufficient documentation

## 2022-02-06 DIAGNOSIS — I10 Essential (primary) hypertension: Secondary | ICD-10-CM | POA: Insufficient documentation

## 2022-02-06 DIAGNOSIS — G4733 Obstructive sleep apnea (adult) (pediatric): Secondary | ICD-10-CM | POA: Insufficient documentation

## 2022-02-06 DIAGNOSIS — G4730 Sleep apnea, unspecified: Secondary | ICD-10-CM | POA: Diagnosis not present

## 2022-02-06 HISTORY — PX: POLYPECTOMY: SHX5525

## 2022-02-06 HISTORY — PX: COLONOSCOPY WITH PROPOFOL: SHX5780

## 2022-02-06 LAB — HM COLONOSCOPY

## 2022-02-06 SURGERY — COLONOSCOPY WITH PROPOFOL
Anesthesia: General

## 2022-02-06 MED ORDER — PROPOFOL 1000 MG/100ML IV EMUL
INTRAVENOUS | Status: AC
  Filled 2022-02-06: qty 100

## 2022-02-06 MED ORDER — PROPOFOL 500 MG/50ML IV EMUL
INTRAVENOUS | Status: DC | PRN
Start: 2022-02-06 — End: 2022-02-06
  Administered 2022-02-06: 150 ug/kg/min via INTRAVENOUS

## 2022-02-06 MED ORDER — LACTATED RINGERS IV SOLN
INTRAVENOUS | Status: DC
  Administered 2022-02-06: 1000 mL via INTRAVENOUS

## 2022-02-06 MED ORDER — PROPOFOL 10 MG/ML IV BOLUS
INTRAVENOUS | Status: DC | PRN
  Administered 2022-02-06: 100 mg via INTRAVENOUS

## 2022-02-06 MED ORDER — LACTATED RINGERS IV SOLN: INTRAVENOUS | Status: DC | PRN

## 2022-02-06 NOTE — Transfer of Care (Signed)
Immediate Anesthesia Transfer of Care Note ? ?Patient: Anthony Anderson ? ?Procedure(s) Performed: COLONOSCOPY WITH PROPOFOL ?POLYPECTOMY ? ?Patient Location: PACU ? ?Anesthesia Type:General ? ?Level of Consciousness: awake, alert , oriented and patient cooperative ? ?Airway & Oxygen Therapy: Patient Spontanous Breathing ? ?Post-op Assessment: Report given to RN, Post -op Vital signs reviewed and stable and Patient moving all extremities X 4 ? ?Post vital signs: Reviewed and stable ? ?Last Vitals:  ?Vitals Value Taken Time  ?BP    ?Temp    ?Pulse    ?Resp    ?SpO2    ? ? ?Last Pain:  ?Vitals:  ? 02/06/22 1005  ?TempSrc:   ?PainSc: 0-No pain  ?   ? ?Patients Stated Pain Goal: 5 (02/06/22 0855) ? ?Complications: No notable events documented. ?

## 2022-02-06 NOTE — Discharge Instructions (Signed)
You are being discharged to home.  Resume your previous diet.  We are waiting for your pathology results.  Your physician has recommended a repeat colonoscopy for surveillance based on pathology results.  

## 2022-02-06 NOTE — Op Note (Signed)
[facility] ?Patient Name: Anthony Anderson ?Procedure Date: 02/06/2022 9:51 AM ?MRN: [id number] ?Date of Birth: April 18, 1961 ?Attending MD: Maylon Peppers ,  ?CSN: [id number] ?Age: 61 ?Admit Type: Outpatient ?Procedure:                Colonoscopy ?Indications:              Surveillance: Personal history of colonic polyps  ?                          (unknown histology) on last colonoscopy more than 3  ?                          years ago ?Providers:                Maylon Peppers, Caprice Kluver, Aram Candela ?Referring MD:              ?Medicines:                Monitored Anesthesia Care ?Complications:            No immediate complications. ?Estimated Blood Loss:     Estimated blood loss: none. ?Procedure:                Pre-Anesthesia Assessment: ?                          - Prior to the procedure, a History and Physical  ?                          was performed, and patient medications, allergies  ?                          and sensitivities were reviewed. The patient's  ?                          tolerance of previous anesthesia was reviewed. ?                          - The risks and benefits of the procedure and the  ?                          sedation options and risks were discussed with the  ?                          patient. All questions were answered and informed  ?                          consent was obtained. ?                          - ASA Grade Assessment: II - A patient with mild  ?                          systemic disease. ?                          After obtaining informed consent, the colonoscope  ?  was passed under direct vision. Throughout the  ?                          procedure, the patient's blood pressure, pulse, and  ?                          oxygen saturations were monitored continuously. The  ?                          PCF-HQ190L ([id number]) scope was introduced through  ?                          the anus and advanced to the the cecum, identified  ?                           by appendiceal orifice and ileocecal valve. The  ?                          colonoscopy was performed without difficulty. The  ?                          patient tolerated the procedure well. The quality  ?                          of the bowel preparation was good. ?Scope In: 10:07:58 AM ?Scope Out: 10:38:21 AM ?Scope Withdrawal Time: 0 hours 21 minutes 39 seconds  ?Total Procedure Duration: 0 hours 30 minutes 23 seconds  ?Findings: ?     The perianal and digital rectal examinations were normal. ?     A 1 mm polyp was found in the cecum. The polyp was sessile. The polyp  ?     was removed with a cold biopsy forceps. Resection and retrieval were  ?     complete. ?     Four sessile polyps were found in the ascending colon and cecum. The  ?     polyps were 2 to 5 mm in size. These polyps were removed with a cold  ?     snare. Resection and retrieval were complete. ?     The retroflexed view of the distal rectum and anal verge was normal and  ?     showed no anal or rectal abnormalities. ?Impression:               - One 1 mm polyp in the cecum, removed with a cold  ?                          biopsy forceps. Resected and retrieved. ?                          - Four 2 to 5 mm polyps in the ascending colon and  ?                          in the cecum, removed with a cold snare. Resected  ?  and retrieved. ?                          - The distal rectum and anal verge are normal on  ?                          retroflexion view. ?Moderate Sedation: ?     Per Anesthesia Care ?Recommendation:           - Discharge patient to home (ambulatory). ?                          - Resume previous diet. ?                          - Await pathology results. ?                          - Repeat colonoscopy for surveillance based on  ?                          pathology results. ?Procedure Code(s):        --- Professional --- ?                          [phone], Colonoscopy, flexible; with removal of  ?                           tumor(s), polyp(s), or other lesion(s) by snare  ?                          technique ?                          45380, 59, Colonoscopy, flexible; with biopsy,  ?                          single or multiple ?Diagnosis Code(s):        --- Professional --- ?                          K63.5, Polyp of colon ?                          Z86.010, Personal history of colonic polyps ?CPT copyright 2019 [employer]. All rights reserved. ?The codes documented in this report are preliminary and upon coder review may  ?be revised to meet current compliance requirements. ?Maylon Peppers, MD ?Maylon Peppers,  ?02/06/2022 10:43:19 AM ?This report has been signed electronically. ?Number of Addenda: 0 ?

## 2022-02-06 NOTE — Anesthesia Preprocedure Evaluation (Addendum)
Anesthesia Evaluation  ?Patient identified by MRN, date of birth, ID band ?Patient awake ? ? ? ?Reviewed: ?Allergy & Precautions, NPO status , Patient's Chart, lab work & pertinent test results, reviewed documented beta blocker date and time  ? ?History of Anesthesia Complications ?Negative for: history of anesthetic complications ? ?Airway ?Mallampati: III ? ?TM Distance: >3 FB ?Neck ROM: Full ? ? ? Dental ? ?(+) Dental Advisory Given, Partial Upper ?  ?Pulmonary ?shortness of breath and with exertion, sleep apnea and Continuous Positive Airway Pressure Ventilation , former smoker,  ?  ?Pulmonary exam normal ?breath sounds clear to auscultation ? ? ? ? ? ? Cardiovascular ?hypertension, Pt. on medications and Pt. on home beta blockers ?+ DOE  ?Normal cardiovascular exam+ dysrhythmias  ?Rhythm:Regular Rate:Normal ? ?Stress test 08/09/19 ?Blood pressure demonstrated a normal response to exercise. ?There was no ST segment deviation noted during stress. ?The study is normal. ?This is a low risk study. ?The left ventricular ejection fraction is normal (55-65%). ?  ?Normal resting and stress perfusion. No ischemia or infarction EF ?63% ?  ?Neuro/Psych ?negative neurological ROS ? negative psych ROS  ? GI/Hepatic ?Neg liver ROS, hiatal hernia, GERD  Medicated and Controlled,  ?Endo/Other  ?negative endocrine ROS ? Renal/GU ?Renal disease  ?negative genitourinary ?  ?Musculoskeletal ?negative musculoskeletal ROS ?(+)  ? Abdominal ?  ?Peds ?negative pediatric ROS ?(+)  Hematology ?negative hematology ROS ?(+)   ?Anesthesia Other Findings ? ? Reproductive/Obstetrics ?negative OB ROS ? ?  ? ? ? ? ? ? ? ? ? ? ? ? ? ?  ?  ? ? ? ? ? ? ? ?Anesthesia Physical ?Anesthesia Plan ? ?ASA: 3 ? ?Anesthesia Plan: General  ? ?Post-op Pain Management: Minimal or no pain anticipated  ? ?Induction: Intravenous ? ?PONV Risk Score and Plan: TIVA ? ?Airway Management Planned: Nasal Cannula, Natural Airway and  Simple Face Mask ? ?Additional Equipment:  ? ?Intra-op Plan:  ? ?Post-operative Plan:  ? ?Informed Consent: I have reviewed the patients History and Physical, chart, labs and discussed the procedure including the risks, benefits and alternatives for the proposed anesthesia with the patient or authorized representative who has indicated his/her understanding and acceptance.  ? ? ? ?Dental advisory given ? ?Plan Discussed with: CRNA and Surgeon ? ?Anesthesia Plan Comments:   ? ? ? ? ? ?Anesthesia Quick Evaluation ? ?

## 2022-02-06 NOTE — Anesthesia Postprocedure Evaluation (Signed)
Anesthesia Post Note ? ?Patient: Anthony Anderson ? ?Procedure(s) Performed: COLONOSCOPY WITH PROPOFOL ?POLYPECTOMY ? ?Patient location during evaluation: Endoscopy ?Anesthesia Type: General ?Level of consciousness: awake and alert and oriented ?Pain management: pain level controlled ?Vital Signs Assessment: post-procedure vital signs reviewed and stable ?Respiratory status: spontaneous breathing, nonlabored ventilation and respiratory function stable ?Cardiovascular status: blood pressure returned to baseline and stable ?Postop Assessment: no apparent nausea or vomiting ?Anesthetic complications: no ? ? ?No notable events documented. ? ? ?Last Vitals:  ?Vitals:  ? 02/06/22 0855 02/06/22 1042  ?BP: 132/87 109/77  ?Pulse: 68 68  ?Resp: 17 16  ?Temp: 36.8 ?C 36.4 ?C  ?SpO2: 98% 96%  ?  ?Last Pain:  ?Vitals:  ? 02/06/22 1042  ?TempSrc: Oral  ?PainSc: 0-No pain  ? ? ?  ?  ?  ?  ?  ?  ? ?Derico Mitton C Lauri Till ? ? ? ? ?

## 2022-02-06 NOTE — H&P (Signed)
Anthony Anderson is an 61 y.o. male.   ?Chief Complaint: Colorectal cancer screening ?HPI: 61 year old male with past medical history of prostate cancer, GERD, hyperlipidemia, hypertension, kidney stones and OSA, coming for screening colonoscopy.  Last colonoscopy was performed in 2012 which was within normal limits.  The patient denies having any complaints such as melena, hematochezia, abdominal pain or distention, change in her bowel movement consistency or frequency, no changes in her weight recently.  No family history of colorectal cancer. ? ? ?Past Medical History:  ?Diagnosis Date  ? Cancer [facility])   ? prostate, currently watching for any changes  ? Cholelithiasis   ? Dyspnea 2015  ? Dysrhythmia   ? GERD (gastroesophageal reflux disease)   ? History of hiatal hernia   ? History of kidney stones   ? Hyperlipidemia   ? Hypertension   ? Kidney stones   ? PSA elevation   ? Sleep apnea   ? uses CPAP  ? ? ?Past Surgical History:  ?Procedure Laterality Date  ? CARDIAC CATHETERIZATION    ? CYSTOSCOPY W/ URETERAL STENT REMOVAL Right 02/24/2020  ? Procedure: CYSTOSCOPY WITH STENT REMOVAL;  Surgeon: Franchot Gallo, MD;  [facility];  Service: Urology;  Laterality: Right;  1 HR  ? CYSTOSCOPY WITH RETROGRADE PYELOGRAM, URETEROSCOPY AND STENT PLACEMENT Right 02/24/2020  ? Procedure: CYSTOSCOPY WITH RETROGRADE PYELOGRAM, URETEROSCOPY AND STENT PLACEMENT;  Surgeon: Franchot Gallo, MD;  [facility];  Service: Urology;  Laterality: Right;  ? CYSTOSCOPY WITH STENT PLACEMENT Right 02/07/2020  ? Procedure: CYSTOSCOPY RIGHT RETROGRADE PYLEOGRAM WITH RIGHT STENT PLACEMENT;  Surgeon: Robley Fries, MD;  [facility];  Service: Urology;  Laterality: Right;  ? HOLMIUM LASER APPLICATION Right 2/33/0076  ? Procedure: HOLMIUM LASER APPLICATION;  Surgeon: Franchot Gallo, MD;  [facility];  Service: Urology;  Laterality: Right;  ? LEFT HEART CATHETERIZATION WITH CORONARY ANGIOGRAM N/A 10/26/2014  ? Procedure:  LEFT HEART CATHETERIZATION WITH CORONARY ANGIOGRAM;  Surgeon: Sinclair Grooms, MD;  Location: [facility] CATH LAB;  Service: Cardiovascular;  Laterality: N/A;  ? LITHOTRIPSY    ? MASS EXCISION Left 01/09/2018  ? Procedure: EXCISION SUBCUTANEOUS MASS, CHEST WALL;  Surgeon: Aviva Signs, MD;  [facility];  Service: General;  Laterality: Left;  ? NASAL SINUS SURGERY    ? PELVIC LYMPH NODE DISSECTION Bilateral 09/11/2018  ? Procedure: PELVIC LYMPH NODE DISSECTION;  Surgeon: Ardis Hughs, MD;  [facility];  Service: Urology;  Laterality: Bilateral;  ? ROBOT ASSISTED LAPAROSCOPIC RADICAL PROSTATECTOMY N/A 09/11/2018  ? Procedure: XI ROBOTIC ASSISTED LAPAROSCOPIC RADICAL PROSTATECTOMY;  Surgeon: Ardis Hughs, MD;  [facility];  Service: Urology;  Laterality: N/A;  ? ? ?Family History  ?Problem Relation Age of Onset  ? CAD Mother   ? CAD Father   ? Prostate cancer Father   ? ?Social History:  reports that he quit smoking about 39 years ago. His smoking use included cigarettes. He has a 10.00 pack-year smoking history. He has never used smokeless tobacco. He reports that he does not drink alcohol and does not use drugs. ? ?Allergies: No Known Allergies ? ?Medications Prior to Admission  ?Medication Sig Dispense Refill  ? Artificial Tear Solution (SOOTHE XP OP) Place 1 drop into both eyes at bedtime as needed (for dry eyes).    ? colchicine 0.6 MG tablet Take 0.6 mg by mouth 2 (two) times daily as needed (gout).    ? metoprolol succinate (TOPROL-XL) 25 MG 24 hr tablet  Take 25 mg by mouth at bedtime.     ? Multiple Vitamin (MULTIVITAMIN WITH MINERALS) TABS tablet Take 1 tablet by mouth daily.    ? olmesartan (BENICAR) 40 MG tablet Take 40 mg by mouth daily.    ? Omega-3 Fatty Acids (FISH OIL) 1200 MG CAPS Take 1,200 mg by mouth daily.    ? pantoprazole (PROTONIX) 40 MG tablet Take 40 mg by mouth daily.    ? rosuvastatin (CRESTOR) 20 MG tablet Take 20 mg by mouth daily.    ? sildenafil (REVATIO) 20 MG  tablet Take 60 mg by mouth daily as needed (ED).    ? sodium chloride (OCEAN) 0.65 % SOLN nasal spray Place 1 spray into both nostrils as needed for congestion.    ? albuterol (VENTOLIN HFA) 108 (90 Base) MCG/ACT inhaler Inhale 1-2 puffs into the lungs every 6 (six) hours as needed for wheezing or shortness of breath.     ? ? ?No results found for this or any previous visit (from the past 48 hour(s)). ?No results found. ? ?Review of Systems  ?Constitutional: Negative.   ?HENT: Negative.    ?Eyes: Negative.   ?Respiratory: Negative.    ?Cardiovascular: Negative.   ?Gastrointestinal: Negative.   ?Endocrine: Negative.   ?Genitourinary: Negative.   ?Musculoskeletal: Negative.   ?Skin: Negative.   ?Allergic/Immunologic: Negative.   ?Neurological: Negative.   ?Hematological: Negative.   ?Psychiatric/Behavioral: Negative.    ? ?Blood pressure 132/87, pulse 68, temperature 98.2 ?F (36.8 ?C), temperature source Oral, resp. rate 17, height '5\' 10"'$  (1.778 m), weight 113.4 kg, SpO2 98 %. ?Physical Exam  ?GENERAL: The patient is AO x3, in no acute distress. ?HEENT: Head is normocephalic and atraumatic. EOMI are intact. Mouth is well hydrated and without lesions. ?NECK: Supple. No masses ?LUNGS: Clear to auscultation. No presence of rhonchi/wheezing/rales. Adequate chest expansion ?HEART: RRR, normal s1 and s2. ?ABDOMEN: Soft, nontender, no guarding, no peritoneal signs, and nondistended. BS +. No masses. ?EXTREMITIES: Without any cyanosis, clubbing, rash, lesions or edema. ?NEUROLOGIC: AOx3, no focal motor deficit. ?SKIN: no jaundice, no rashes ? ?Assessment/Plan ?61 year old male with past medical history of prostate cancer, GERD, hyperlipidemia, hypertension, kidney stones and OSA, coming for screening colonoscopy. The patient is at average risk for colorectal cancer.  We will proceed with colonoscopy today. ? ? ?Harvel Quale, MD ?02/06/2022, 9:19 AM ? ? ? ?

## 2022-02-07 LAB — SURGICAL PATHOLOGY

## 2022-02-08 ENCOUNTER — Encounter (HOSPITAL_COMMUNITY): Payer: Self-pay | Admitting: Gastroenterology

## 2022-03-04 DIAGNOSIS — C61 Malignant neoplasm of prostate: Secondary | ICD-10-CM | POA: Diagnosis not present

## 2022-03-05 DIAGNOSIS — G4733 Obstructive sleep apnea (adult) (pediatric): Secondary | ICD-10-CM | POA: Diagnosis not present

## 2022-03-11 DIAGNOSIS — N5231 Erectile dysfunction following radical prostatectomy: Secondary | ICD-10-CM | POA: Diagnosis not present

## 2022-04-05 DIAGNOSIS — I10 Essential (primary) hypertension: Secondary | ICD-10-CM | POA: Diagnosis not present

## 2022-04-13 DIAGNOSIS — M109 Gout, unspecified: Secondary | ICD-10-CM | POA: Diagnosis not present

## 2022-04-13 DIAGNOSIS — K219 Gastro-esophageal reflux disease without esophagitis: Secondary | ICD-10-CM | POA: Diagnosis not present

## 2022-04-13 DIAGNOSIS — E785 Hyperlipidemia, unspecified: Secondary | ICD-10-CM | POA: Diagnosis not present

## 2022-04-13 DIAGNOSIS — I10 Essential (primary) hypertension: Secondary | ICD-10-CM | POA: Diagnosis not present

## 2022-06-17 ENCOUNTER — Ambulatory Visit: Payer: Self-pay | Admitting: *Deleted

## 2022-06-17 ENCOUNTER — Encounter: Payer: Self-pay | Admitting: *Deleted

## 2022-06-17 NOTE — Patient Outreach (Signed)
  Care Coordination   Initial Visit Note   06/17/2022  Name: Anthony Anderson MRN: [id number] DOB: 01-29-1961  Anthony Anderson is a 61 y.o. year old male who sees Nevada Crane, Edwinna Areola, MD for primary care. I spoke with Susy Manor by phone today.  What matters to the patients health and wellness today?  No Interventions Indicated.   Goals Addressed   None     SDOH assessments and interventions completed:  Yes.    SDOH Interventions Today    Flowsheet Row Most Recent Value  SDOH Interventions   Food Insecurity Interventions Intervention Not Indicated  Financial Strain Interventions Intervention Not Indicated  Housing Interventions Intervention Not Indicated  Physical Activity Interventions Patient Refused  Stress Interventions Intervention Not Indicated  Social Connections Interventions Intervention Not Indicated  Transportation Interventions Intervention Not Indicated        Care Coordination Interventions Activated:  Yes.   Care Coordination Interventions:  Yes, provided.   Follow up plan: No further intervention required.   Encounter Outcome:  Pt. Visit Completed.   Nat Christen, BSW, MSW, LCSW  Licensed Education officer, [facility]  Mailing [place] [address] [place], [place] [address] Physical Address-[address], [place], [place] [address] Toll Free Main # [phone] Fax # [phone] Cell # [phone] [email or web address]

## 2022-06-17 NOTE — Patient Instructions (Signed)
Visit Information  Thank you for taking time to visit with me today. Please don't hesitate to contact me if I can be of assistance to you.   Please call the care guide team at [phone] if you need to cancel or reschedule your appointment.   If you are experiencing a Mental Health or Behavioral Health Crisis or need someone to talk to, please call the Suicide and Crisis Lifeline: 988 call the [place] National Suicide Prevention Lifeline: 1-800-273-8255 or TTY: 1-800-799-4 TTY (1-800-799-4889) to talk to a trained counselor call 1-800-273-TALK (toll free, 24 hour hotline) go to [facility] [address], [place] ([phone]) call the [place] Crisis Line: 800-939-9988 call 911  Patient verbalizes understanding of instructions and care plan provided today and agrees to view in MyChart. Active MyChart status and patient understanding of how to access instructions and care plan via MyChart confirmed with patient.     No further follow up required.  Katja Blue, BSW, MSW, LCSW  Licensed Clinical Social Worker  [facility] Management [place] [facility]  Mailing Address-[address] [place], [place], [place] [address] Physical Address-[address], [place], [place] [address] Toll Free Main # [phone] Fax # [phone] Cell # [phone] [email or web address]            

## 2022-07-10 DIAGNOSIS — Z125 Encounter for screening for malignant neoplasm of prostate: Secondary | ICD-10-CM | POA: Diagnosis not present

## 2022-07-10 DIAGNOSIS — I10 Essential (primary) hypertension: Secondary | ICD-10-CM | POA: Diagnosis not present

## 2022-08-08 DIAGNOSIS — E785 Hyperlipidemia, unspecified: Secondary | ICD-10-CM | POA: Diagnosis not present

## 2022-08-08 DIAGNOSIS — I10 Essential (primary) hypertension: Secondary | ICD-10-CM | POA: Diagnosis not present

## 2022-08-08 DIAGNOSIS — M109 Gout, unspecified: Secondary | ICD-10-CM | POA: Diagnosis not present

## 2022-08-08 DIAGNOSIS — K219 Gastro-esophageal reflux disease without esophagitis: Secondary | ICD-10-CM | POA: Diagnosis not present

## 2022-09-12 DIAGNOSIS — G4733 Obstructive sleep apnea (adult) (pediatric): Secondary | ICD-10-CM | POA: Diagnosis not present

## 2022-10-12 DIAGNOSIS — G4733 Obstructive sleep apnea (adult) (pediatric): Secondary | ICD-10-CM | POA: Diagnosis not present

## 2022-10-15 DIAGNOSIS — G4733 Obstructive sleep apnea (adult) (pediatric): Secondary | ICD-10-CM | POA: Diagnosis not present

## 2022-10-29 DIAGNOSIS — J019 Acute sinusitis, unspecified: Secondary | ICD-10-CM | POA: Diagnosis not present

## 2022-11-12 DIAGNOSIS — G4733 Obstructive sleep apnea (adult) (pediatric): Secondary | ICD-10-CM | POA: Diagnosis not present

## 2022-11-15 DIAGNOSIS — G4733 Obstructive sleep apnea (adult) (pediatric): Secondary | ICD-10-CM | POA: Diagnosis not present

## 2022-12-13 DIAGNOSIS — G4733 Obstructive sleep apnea (adult) (pediatric): Secondary | ICD-10-CM | POA: Diagnosis not present

## 2022-12-16 DIAGNOSIS — G4733 Obstructive sleep apnea (adult) (pediatric): Secondary | ICD-10-CM | POA: Diagnosis not present

## 2023-01-11 DIAGNOSIS — G4733 Obstructive sleep apnea (adult) (pediatric): Secondary | ICD-10-CM | POA: Diagnosis not present

## 2023-01-13 DIAGNOSIS — G4733 Obstructive sleep apnea (adult) (pediatric): Secondary | ICD-10-CM | POA: Diagnosis not present

## 2023-01-14 DIAGNOSIS — G4733 Obstructive sleep apnea (adult) (pediatric): Secondary | ICD-10-CM | POA: Diagnosis not present

## 2023-02-04 DIAGNOSIS — Z125 Encounter for screening for malignant neoplasm of prostate: Secondary | ICD-10-CM | POA: Diagnosis not present

## 2023-02-04 DIAGNOSIS — M109 Gout, unspecified: Secondary | ICD-10-CM | POA: Diagnosis not present

## 2023-02-04 DIAGNOSIS — I10 Essential (primary) hypertension: Secondary | ICD-10-CM | POA: Diagnosis not present

## 2023-02-10 DIAGNOSIS — M109 Gout, unspecified: Secondary | ICD-10-CM | POA: Diagnosis not present

## 2023-02-10 DIAGNOSIS — T50995A Adverse effect of other drugs, medicaments and biological substances, initial encounter: Secondary | ICD-10-CM | POA: Diagnosis not present

## 2023-02-10 DIAGNOSIS — E785 Hyperlipidemia, unspecified: Secondary | ICD-10-CM | POA: Diagnosis not present

## 2023-02-10 DIAGNOSIS — Z0001 Encounter for general adult medical examination with abnormal findings: Secondary | ICD-10-CM | POA: Diagnosis not present

## 2023-02-10 DIAGNOSIS — M791 Myalgia, unspecified site: Secondary | ICD-10-CM | POA: Diagnosis not present

## 2023-02-10 DIAGNOSIS — I10 Essential (primary) hypertension: Secondary | ICD-10-CM | POA: Diagnosis not present

## 2023-02-10 DIAGNOSIS — K219 Gastro-esophageal reflux disease without esophagitis: Secondary | ICD-10-CM | POA: Diagnosis not present

## 2023-02-11 DIAGNOSIS — G4733 Obstructive sleep apnea (adult) (pediatric): Secondary | ICD-10-CM | POA: Diagnosis not present

## 2023-02-13 DIAGNOSIS — G4733 Obstructive sleep apnea (adult) (pediatric): Secondary | ICD-10-CM | POA: Diagnosis not present

## 2023-03-12 DIAGNOSIS — N5231 Erectile dysfunction following radical prostatectomy: Secondary | ICD-10-CM | POA: Diagnosis not present

## 2023-03-13 DIAGNOSIS — G4733 Obstructive sleep apnea (adult) (pediatric): Secondary | ICD-10-CM | POA: Diagnosis not present

## 2023-03-15 DIAGNOSIS — G4733 Obstructive sleep apnea (adult) (pediatric): Secondary | ICD-10-CM | POA: Diagnosis not present

## 2023-03-19 DIAGNOSIS — N393 Stress incontinence (female) (male): Secondary | ICD-10-CM | POA: Diagnosis not present

## 2023-03-19 DIAGNOSIS — Z8546 Personal history of malignant neoplasm of prostate: Secondary | ICD-10-CM | POA: Diagnosis not present

## 2023-04-13 ENCOUNTER — Emergency Department (HOSPITAL_COMMUNITY): Payer: BC Managed Care – PPO

## 2023-04-13 ENCOUNTER — Other Ambulatory Visit: Payer: Self-pay

## 2023-04-13 ENCOUNTER — Encounter (HOSPITAL_COMMUNITY): Payer: Self-pay

## 2023-04-13 ENCOUNTER — Observation Stay (HOSPITAL_COMMUNITY)
Admission: EM | Admit: 2023-04-13 | Discharge: 2023-04-14 | Disposition: A | Discharge status: Home / Self Care | Payer: BC Managed Care – PPO | Attending: Internal Medicine | Admitting: Internal Medicine

## 2023-04-13 DIAGNOSIS — E66812 Obesity, class 2: Secondary | ICD-10-CM | POA: Insufficient documentation

## 2023-04-13 DIAGNOSIS — I10 Essential (primary) hypertension: Secondary | ICD-10-CM | POA: Diagnosis not present

## 2023-04-13 DIAGNOSIS — Z6837 Body mass index (BMI) 37.0-37.9, adult: Secondary | ICD-10-CM | POA: Insufficient documentation

## 2023-04-13 DIAGNOSIS — E785 Hyperlipidemia, unspecified: Secondary | ICD-10-CM | POA: Diagnosis present

## 2023-04-13 DIAGNOSIS — R29818 Other symptoms and signs involving the nervous system: Secondary | ICD-10-CM | POA: Diagnosis not present

## 2023-04-13 DIAGNOSIS — R079 Chest pain, unspecified: Principal | ICD-10-CM | POA: Diagnosis present

## 2023-04-13 DIAGNOSIS — G4733 Obstructive sleep apnea (adult) (pediatric): Secondary | ICD-10-CM | POA: Diagnosis not present

## 2023-04-13 DIAGNOSIS — E669 Obesity, unspecified: Secondary | ICD-10-CM | POA: Insufficient documentation

## 2023-04-13 DIAGNOSIS — R0789 Other chest pain: Secondary | ICD-10-CM

## 2023-04-13 DIAGNOSIS — Z7982 Long term (current) use of aspirin: Secondary | ICD-10-CM | POA: Diagnosis not present

## 2023-04-13 DIAGNOSIS — R0602 Shortness of breath: Secondary | ICD-10-CM | POA: Diagnosis not present

## 2023-04-13 DIAGNOSIS — K449 Diaphragmatic hernia without obstruction or gangrene: Secondary | ICD-10-CM | POA: Diagnosis not present

## 2023-04-13 DIAGNOSIS — Z87891 Personal history of nicotine dependence: Secondary | ICD-10-CM | POA: Insufficient documentation

## 2023-04-13 DIAGNOSIS — Z8546 Personal history of malignant neoplasm of prostate: Secondary | ICD-10-CM | POA: Diagnosis not present

## 2023-04-13 DIAGNOSIS — Z79899 Other long term (current) drug therapy: Secondary | ICD-10-CM | POA: Insufficient documentation

## 2023-04-13 LAB — BASIC METABOLIC PANEL
Anion gap: 9 (ref 5–15)
BUN: 17 mg/dL (ref 8–23)
CO2: 22 mmol/L (ref 22–32)
Calcium: 9.3 mg/dL (ref 8.9–10.3)
Chloride: 107 mmol/L (ref 98–111)
Creatinine, Ser: 1.22 mg/dL (ref 0.61–1.24)
GFR, Estimated: >60 mL/min (ref >60)
Glucose, Bld: 101 mg/dL — ABNORMAL HIGH (ref 70–99)
Potassium: 3.8 mmol/L (ref 3.5–5.1)
Sodium: 138 mmol/L (ref 135–145)

## 2023-04-13 LAB — CBC
HCT: 50.8 % (ref 39.0–52.0)
Hemoglobin: 17.2 g/dL — ABNORMAL HIGH (ref 13.0–17.0)
MCH: 29.6 pg (ref 26.0–34.0)
MCHC: 33.9 g/dL (ref 30.0–36.0)
MCV: 87.3 fL (ref 80.0–100.0)
Platelets: 186 10*3/uL (ref 150–400)
RBC: 5.82 MIL/uL — ABNORMAL HIGH (ref 4.22–5.81)
RDW: 12.9 % (ref 11.5–15.5)
WBC: 4.9 10*3/uL (ref 4.0–10.5)
nRBC: 0 % (ref 0.0–0.2)

## 2023-04-13 LAB — TROPONIN I (HIGH SENSITIVITY)
Troponin I (High Sensitivity): <2 ng/L (ref <18)
Troponin I (High Sensitivity): 2 ng/L (ref <18)

## 2023-04-13 LAB — D-DIMER, QUANTITATIVE: D-Dimer, Quant: 0.42 ug/mL-FEU (ref 0.00–0.50)

## 2023-04-13 MED ORDER — PANTOPRAZOLE SODIUM 40 MG PO TBEC
40.0000 mg | DELAYED_RELEASE_TABLET | Freq: Every day | ORAL | Status: DC
  Administered 2023-04-14: 40 mg via ORAL
  Filled 2023-04-13: qty 1

## 2023-04-13 MED ORDER — ONDANSETRON HCL 4 MG PO TABS: 4.0000 mg | ORAL_TABLET | Freq: Four times a day (QID) | ORAL | Status: DC | PRN

## 2023-04-13 MED ORDER — IRBESARTAN 150 MG PO TABS
300.0000 mg | ORAL_TABLET | Freq: Every day | ORAL | Status: DC
  Administered 2023-04-14: 300 mg via ORAL
  Filled 2023-04-13: qty 2

## 2023-04-13 MED ORDER — ALLOPURINOL 100 MG PO TABS
100.0000 mg | ORAL_TABLET | Freq: Every day | ORAL | Status: DC
  Administered 2023-04-14: 100 mg via ORAL
  Filled 2023-04-13: qty 1

## 2023-04-13 MED ORDER — ACETAMINOPHEN 325 MG PO TABS: 650.0000 mg | ORAL_TABLET | Freq: Four times a day (QID) | ORAL | Status: DC | PRN

## 2023-04-13 MED ORDER — ENOXAPARIN SODIUM 60 MG/0.6ML IJ SOSY
60.0000 mg | PREFILLED_SYRINGE | Freq: Every day | INTRAMUSCULAR | Status: DC
  Administered 2023-04-13: 60 mg via SUBCUTANEOUS
  Filled 2023-04-13: qty 0.6

## 2023-04-13 MED ORDER — ONDANSETRON HCL 4 MG/2ML IJ SOLN: 4.0000 mg | Freq: Four times a day (QID) | INTRAMUSCULAR | Status: DC | PRN

## 2023-04-13 MED ORDER — ASPIRIN 81 MG PO TBEC
81.0000 mg | DELAYED_RELEASE_TABLET | Freq: Every day | ORAL | Status: DC
  Administered 2023-04-14: 81 mg via ORAL
  Filled 2023-04-13: qty 1

## 2023-04-13 MED ORDER — METOPROLOL SUCCINATE ER 25 MG PO TB24
25.0000 mg | ORAL_TABLET | Freq: Every day | ORAL | Status: DC
  Administered 2023-04-13: 25 mg via ORAL
  Filled 2023-04-13: qty 1

## 2023-04-13 MED ORDER — POLYETHYLENE GLYCOL 3350 17 G PO PACK: 17.0000 g | PACK | Freq: Every day | ORAL | Status: DC | PRN

## 2023-04-13 MED ORDER — ASPIRIN 325 MG PO TABS
325.0000 mg | ORAL_TABLET | Freq: Once | ORAL | Status: AC
  Administered 2023-04-13: 325 mg via ORAL
  Filled 2023-04-13: qty 1

## 2023-04-13 MED ORDER — NITROGLYCERIN 0.4 MG SL SUBL: 0.4000 mg | SUBLINGUAL_TABLET | SUBLINGUAL | Status: DC | PRN

## 2023-04-13 MED ORDER — ACETAMINOPHEN 650 MG RE SUPP: 650.0000 mg | Freq: Four times a day (QID) | RECTAL | Status: DC | PRN

## 2023-04-13 MED ORDER — ATORVASTATIN CALCIUM 40 MG PO TABS
40.0000 mg | ORAL_TABLET | Freq: Every day | ORAL | Status: DC
  Administered 2023-04-14: 40 mg via ORAL
  Filled 2023-04-13: qty 1

## 2023-04-13 NOTE — ED Notes (Signed)
Pt allowed to eat . Boost given to pt.

## 2023-04-13 NOTE — Assessment & Plan Note (Addendum)
Stable. -Resume metoprolol, Olmesartan

## 2023-04-13 NOTE — ED Notes (Signed)
ED TO INPATIENT HANDOFF REPORT  ED Nurse Name and Phone #:  Bonita Quin (first shift)  S Name/Age/Gender Anthony Anderson 62 y.o. male Room/Bed: APA14/APA14  Code Status   Code Status: Prior  Home/SNF/Other Home Patient oriented to: self, place, time, and situation Is this baseline? Yes   Triage Complete: Triage complete  Chief Complaint Chest pain [R07.9]  Triage Note Pt comes in with complaints with central CP with intermittent pressure, burning sensation, that intermittently radiates down lt arm for the past week. Pt states hx of acid reflux but states this "feels different."   Allergies No Known Allergies  Level of Care/Admitting Diagnosis ED Disposition     ED Disposition  Admit   Condition  --   Comment  Hospital Area: [facility] [[id number]]  Level of Care: Telemetry [5]  Covid Evaluation: Asymptomatic - no recent exposure (last 10 days) testing not required  Diagnosis: Chest pain [161096]  Admitting Physician: Onnie Boer [[id number]]  Attending Physician: Onnie Boer [phone]          B Medical/Surgery History Past Medical History:  Diagnosis Date   Cancer (HCC)    prostate, currently watching for any changes   Cholelithiasis    Dyspnea 2015   Dysrhythmia    GERD (gastroesophageal reflux disease)    History of hiatal hernia    History of kidney stones    Hyperlipidemia    Hypertension    Kidney stones    PSA elevation    Sleep apnea    uses CPAP   Past Surgical History:  Procedure Laterality Date   CARDIAC CATHETERIZATION     COLONOSCOPY WITH PROPOFOL N/A 02/06/2022   Procedure: COLONOSCOPY WITH PROPOFOL;  Surgeon: Dolores Frame, MD;  [facility];  Service: Gastroenterology;  Laterality: N/A;  10:00   CYSTOSCOPY W/ URETERAL STENT REMOVAL Right 02/24/2020   Procedure: CYSTOSCOPY WITH STENT REMOVAL;  Surgeon: Marcine Matar, MD;  [facility];  Service: Urology;  Laterality: Right;  1 HR    CYSTOSCOPY WITH RETROGRADE PYELOGRAM, URETEROSCOPY AND STENT PLACEMENT Right 02/24/2020   Procedure: CYSTOSCOPY WITH RETROGRADE PYELOGRAM, URETEROSCOPY AND STENT PLACEMENT;  Surgeon: Marcine Matar, MD;  [facility];  Service: Urology;  Laterality: Right;   CYSTOSCOPY WITH STENT PLACEMENT Right 02/07/2020   Procedure: CYSTOSCOPY RIGHT RETROGRADE PYLEOGRAM WITH RIGHT STENT PLACEMENT;  Surgeon: Noel Christmas, MD;  [facility];  Service: Urology;  Laterality: Right;   HOLMIUM LASER APPLICATION Right 02/24/2020   Procedure: HOLMIUM LASER APPLICATION;  Surgeon: Marcine Matar, MD;  [facility];  Service: Urology;  Laterality: Right;   LEFT HEART CATHETERIZATION WITH CORONARY ANGIOGRAM N/A 10/26/2014   Procedure: LEFT HEART CATHETERIZATION WITH CORONARY ANGIOGRAM;  Surgeon: Lesleigh Noe, MD;  Location: [facility] CATH LAB;  Service: Cardiovascular;  Laterality: N/A;   LITHOTRIPSY     MASS EXCISION Left 01/09/2018   Procedure: EXCISION SUBCUTANEOUS MASS, CHEST WALL;  Surgeon: Franky Macho, MD;  [facility];  Service: General;  Laterality: Left;   NASAL SINUS SURGERY     PELVIC LYMPH NODE DISSECTION Bilateral 09/11/2018   Procedure: PELVIC LYMPH NODE DISSECTION;  Surgeon: Crist Fat, MD;  [facility];  Service: Urology;  Laterality: Bilateral;   POLYPECTOMY  02/06/2022   Procedure: POLYPECTOMY;  Surgeon: Dolores Frame, MD;  [facility];  Service: Gastroenterology;;   ROBOT ASSISTED LAPAROSCOPIC RADICAL PROSTATECTOMY N/A 09/11/2018   Procedure: XI ROBOTIC ASSISTED LAPAROSCOPIC RADICAL PROSTATECTOMY;  Surgeon:  Crist Fat, MD;  [facility];  Service: Urology;  Laterality: N/A;     A IV Location/Drains/Wounds Patient Lines/Drains/Airways Status     Active Line/Drains/Airways     Name Placement date Placement time Site Days   Peripheral IV 04/13/23 20 G 1" Right Antecubital 04/13/23  1323  Antecubital  less than 1   Ureteral  Drain/Stent Right ureter 6 Fr. 02/24/20  1315  Right ureter  1144   Incision (Closed) 01/09/18 Chest Left 01/09/18  0751  -- 1920   Incision - 6 Ports Abdomen 1: Lateral;Lower;Left 2: Left;Lateral;Upper 3: Umbilicus 4: Right;Lower;Lateral 5: Right;Medial;Upper 6: Right;Lateral;Upper 09/11/18  0752  -- 1675            Intake/Output Last 24 hours No intake or output data in the 24 hours ending 04/13/23 1917  Labs/Imaging Results for orders placed or performed during the hospital encounter of 04/13/23 (from the past 48 hour(s))  Basic metabolic panel     Status: Abnormal   Collection Time: 04/13/23  1:34 PM  Result Value Ref Range   Sodium 138 135 - 145 mmol/L   Potassium 3.8 3.5 - 5.1 mmol/L   Chloride 107 98 - 111 mmol/L   CO2 22 22 - 32 mmol/L   Glucose, Bld 101 (H) 70 - 99 mg/dL    Comment: Glucose reference range applies only to samples taken after fasting for at least 8 hours.   BUN 17 8 - 23 mg/dL   Creatinine, Ser 1.61 0.61 - 1.24 mg/dL   Calcium 9.3 8.9 - 09.6 mg/dL   GFR, Estimated >04 >54 mL/min    Comment: (NOTE) Calculated using the CKD-EPI Creatinine Equation (2021)    Anion gap 9 5 - 15    Comment: Performed at [facility], [address] [place], [place] [address]  CBC     Status: Abnormal   Collection Time: 04/13/23  1:34 PM  Result Value Ref Range   WBC 4.9 4.0 - 10.5 K/uL   RBC 5.82 (H) 4.22 - 5.81 MIL/uL   Hemoglobin 17.2 (H) 13.0 - 17.0 g/dL   HCT 91.4 78.2 - 95.6 %   MCV 87.3 80.0 - 100.0 fL   MCH 29.6 26.0 - 34.0 pg   MCHC 33.9 30.0 - 36.0 g/dL   RDW 21.3 08.6 - 57.8 %   Platelets 186 150 - 400 K/uL   nRBC 0.0 0.0 - 0.2 %    Comment: Performed at [facility], [address], [place], [place] [address]  Troponin I (High Sensitivity)     Status: None   Collection Time: 04/13/23  1:34 PM  Result Value Ref Range   Troponin I (High Sensitivity) <2 <18 ng/L    Comment: (NOTE) Elevated high sensitivity troponin I (hsTnI) values and significant   changes across serial measurements may suggest ACS but many other  chronic and acute conditions are known to elevate hsTnI results.  Refer to the "Links" section for chest pain algorithms and additional  guidance. Performed at [facility], [address] [place], [place] [address]   Troponin I (High Sensitivity)     Status: None   Collection Time: 04/13/23  3:34 PM  Result Value Ref Range   Troponin I (High Sensitivity) 2 <18 ng/L    Comment: (NOTE) Elevated high sensitivity troponin I (hsTnI) values and significant  changes across serial measurements may suggest ACS but many other  chronic and acute conditions are known to elevate hsTnI results.  Refer to the "  Links" section for chest pain algorithms and additional  guidance. Performed at [facility], [address] [place], [place] [address]   D-dimer, quantitative     Status: None   Collection Time: 04/13/23  6:07 PM  Result Value Ref Range   D-Dimer, Quant 0.42 0.00 - 0.50 ug/mL-FEU    Comment: (NOTE) At the manufacturer cut-off value of 0.5 g/mL FEU, this assay has a negative predictive value of 95-100%.This assay is intended for use in conjunction with a clinical pretest probability (PTP) assessment model to exclude pulmonary embolism (PE) and deep venous thrombosis (DVT) in outpatients suspected of PE or DVT. Results should be correlated with clinical presentation. Performed at [facility], [address], [place], [place] [address]    CT Head Wo Contrast  Result Date: 04/13/2023 CLINICAL DATA:  Acute neurologic deficit.  Suspected stroke. EXAM: CT HEAD WITHOUT CONTRAST TECHNIQUE: Contiguous axial images were obtained from the base of the skull through the vertex without intravenous contrast. RADIATION DOSE REDUCTION: This exam was performed according to the departmental dose-optimization program which includes automated exposure control, adjustment of the mA and/or kV according to patient size and/or use of iterative  reconstruction technique. COMPARISON:  11/13/2014 FINDINGS: Brain: No evidence of intracranial hemorrhage, acute infarction, hydrocephalus, extra-axial collection, or mass lesion/mass effect. Vascular:  No hyperdense vessel or other acute findings. Skull: No evidence of fracture or other significant bone abnormality. Sinuses/Orbits: No acute findings. Diffuse mucosal thickening is seen throughout the paranasal sinuses bilaterally. No air-fluid levels. Other: None. IMPRESSION: No acute intracranial abnormality. Chronic pansinusitis. Electronically Signed   By: Danae Orleans M.D.   On: 04/13/2023 14:57   DG Chest 2 View  Result Date: 04/13/2023 CLINICAL DATA:  Chest pain EXAM: CHEST - 2 VIEW COMPARISON:  Previous chest radiographs done on 08/31/2016 and CT done on 11/21/2017 FINDINGS: Transverse diameter of heart is increased. There is soft tissue fullness in retrocardiac region suggesting moderate sized fixed hiatal hernia. There are no signs of pulmonary edema or focal pulmonary consolidation. There is no pleural effusion or pneumothorax. IMPRESSION: Cardiomegaly. There are no signs of pulmonary edema or focal pulmonary consolidation. Fixed hiatal hernia. Electronically Signed   By: Ernie Avena M.D.   On: 04/13/2023 14:19    Pending Labs Unresulted Labs (From admission, onward)    None       Vitals/Pain Today's Vitals   04/13/23 1700 04/13/23 1745 04/13/23 1800 04/13/23 1815  BP: (!) 140/98 (!) 138/95 (!) 143/80 (!) 145/93  Pulse: 81 71 73 73  Resp: (!) 23 (!) 22 19 (!) 23  Temp:      TempSrc:      SpO2: 95% 94% 95% 95%  Weight:      Height:      PainSc:        Isolation Precautions No active isolations  Medications Medications - No data to display  Mobility walks     Focused Assessments Cardiac Assessment Handoff:    Lab Results  Component Value Date   TROPONINI <0.03 10/24/2014   Lab Results  Component Value Date   DDIMER 0.42 04/13/2023   Does the Patient  currently have chest pain? Yes (slight burn to left chest and arm)   R Recommendations: See Admitting Provider Note  Report given to:   Additional Notes:  Uses urinal

## 2023-04-13 NOTE — ED Triage Notes (Signed)
Pt comes in with complaints with central CP with intermittent pressure, burning sensation, that intermittently radiates down lt arm for the past week. Pt states hx of acid reflux but states this "feels different."

## 2023-04-13 NOTE — Assessment & Plan Note (Addendum)
To rule out cardiac etiology.  Troponin unremarkable.  EKG unchanged.  Remote history of smoking over 40 years ago.  Positive family history of cardiac disease in father in his 68s.  Cardiac cath 2015, no angiographically significant coronary disease noted. -Echo -N.p.o. midnight -Consult in a.m. -Aspirin 325 x 1, continue 81 mg daily -Resume Lipitor 40 mg daily - PRN Nitro

## 2023-04-13 NOTE — ED Provider Notes (Signed)
[place] [facility]ERGENCY DEPARTMENT AT [facility] Provider Note   CSN: [id number] Arrival date & time: 04/13/23  1245     History  No chief complaint on file.  HPI Anthony Anderson is a 62 y.o. male with history of hypertension, hyperlipidemia, remote history of prostate cancer status prostatectomy 5 years ago presenting for chest pain.  States a week ago his left arm started to feel numb and tingly.  2 days ago he started to have a burning sensation in his chest.  The pain is in the left chest and he does feel it radiates to his left arm.  Chest pain is constant but does wax and wane in intensity.  Unsure if it is exertional.  Associated some shortness of breath with his symptoms as well.  Denies calf tenderness and recent immobilization.  Takes Benicar daily for his hypertension.  HPI     Home Medications Prior to Admission medications   Medication Sig Start Date End Date Taking? Authorizing Provider  albuterol (VENTOLIN HFA) 108 (90 Base) MCG/ACT inhaler Inhale 1-2 puffs into the lungs every 6 (six) hours as needed for wheezing or shortness of breath.  09/30/19  Yes [provider]  allopurinol (ZYLOPRIM) 100 MG tablet Take 1 tablet by mouth daily.   Yes [provider]  Artificial Tear Solution (SOOTHE XP OP) Place 1 drop into both eyes at bedtime as needed (for dry eyes).   Yes [provider]  aspirin EC 81 MG tablet Take 162 mg by mouth as needed (for chest pain). Swallow whole.   Yes [provider]  atorvastatin (LIPITOR) 40 MG tablet Take 1 tablet by mouth daily.   Yes [provider]  colchicine 0.6 MG tablet Take 0.6 mg by mouth 2 (two) times daily as needed (gout). 10/02/21  Yes [provider]  ibuprofen (IBU) 400 MG tablet Take 400 mg by mouth every 8 (eight) hours as needed for moderate pain.   Yes [provider]  metoprolol succinate (TOPROL-XL) 25 MG 24 hr tablet Take 25 mg by mouth at bedtime.  06/28/19   Yes [provider]  Multiple Vitamin (MULTIVITAMIN WITH MINERALS) TABS tablet Take 1 tablet by mouth daily.   Yes [provider]  olmesartan (BENICAR) 40 MG tablet Take 40 mg by mouth daily. 11/29/19  Yes [provider]  Omega-3 Fatty Acids (FISH OIL) 1200 MG CAPS Take 1,200 mg by mouth daily.   Yes [provider]  pantoprazole (PROTONIX) 40 MG tablet Take 40 mg by mouth daily.   Yes [provider]  sildenafil (REVATIO) 20 MG tablet Take 60 mg by mouth daily as needed (ED).   Yes [provider]  sodium chloride (OCEAN) 0.65 % SOLN nasal spray Place 1 spray into both nostrils as needed for congestion.   Yes [provider]      Allergies    Patient has no known allergies.    Review of Systems   Chest pain and left arm numbness and tingling.   Physical Exam Updated Vital Signs BP (!) 145/93   Pulse 73   Temp 99.5 F (37.5 C) (Oral)   Resp (!) 23   Ht 5\' 10"  (1.778 m)   Wt 117.9 kg   SpO2 95%   BMI 37.31 kg/m  Physical Exam Vitals and nursing note reviewed.  HENT:     Head: Normocephalic and atraumatic.     Mouth/Throat:     Mouth: Mucous membranes are moist.  Eyes:     General:        Right eye: No discharge.        Left eye: No discharge.     Conjunctiva/sclera: Conjunctivae normal.  Cardiovascular:     Rate and Rhythm: Normal rate and regular rhythm.     Pulses: Normal pulses.     Heart sounds: Normal heart sounds.  Pulmonary:     Effort: Pulmonary effort is normal.     Breath sounds: Normal breath sounds.  Abdominal:     General: Abdomen is flat.     Palpations: Abdomen is soft.  Skin:    General: Skin is warm and dry.  Neurological:     General: No focal deficit present.     Comments: GCS 15. Speech is goal oriented. No deficits appreciated to CN III-XII; symmetric eyebrow raise, no facial drooping, tongue midline. Patient has equal grip strength bilaterally with 5/5 strength against resistance in  all major muscle groups bilaterally. Sensation to light touch intact. Patient moves extremities without ataxia. Normal finger-nose-finger. Patient ambulatory with steady gait.  Psychiatric:        Mood and Affect: Mood normal.     ED Results / Procedures / Treatments   Labs (all labs ordered are listed, but only abnormal results are displayed) Labs Reviewed  BASIC METABOLIC PANEL - Abnormal; Notable for the following components:      Result Value   Glucose, Bld 101 (*)    All other components within normal limits  CBC - Abnormal; Notable for the following components:   RBC 5.82 (*)    Hemoglobin 17.2 (*)    All other components within normal limits  D-DIMER, QUANTITATIVE  TROPONIN I (HIGH SENSITIVITY)  TROPONIN I (HIGH SENSITIVITY)    EKG EKG Interpretation  Date/Time:  Sunday April 13 2023 12:57:32 EDT Ventricular Rate:  80 PR Interval:  172 QRS Duration: 94 QT Interval:  380 QTC Calculation: 438 R Axis:   13 Text Interpretation: Normal sinus rhythm Normal ECG When compared with ECG of 05-Jan-2018 08:13, No significant change was found No significant change since last tracing Confirmed by Jacalyn Lefevre [phone]) on 04/13/2023 1:39:23 PM  Radiology CT Head Wo Contrast  Result Date: 04/13/2023 CLINICAL DATA:  Acute neurologic deficit.  Suspected stroke. EXAM: CT HEAD WITHOUT CONTRAST TECHNIQUE: Contiguous axial images were obtained from the base of the skull through the vertex without intravenous contrast. RADIATION DOSE REDUCTION: This exam was performed according to the departmental dose-optimization program which includes automated exposure control, adjustment of the mA and/or kV according to patient size and/or use of iterative reconstruction technique. COMPARISON:  11/13/2014 FINDINGS: Brain: No evidence of intracranial hemorrhage, acute infarction, hydrocephalus, extra-axial collection, or mass lesion/mass effect. Vascular:  No hyperdense vessel or other acute findings.  Skull: No evidence of fracture or other significant bone abnormality. Sinuses/Orbits: No acute findings. Diffuse mucosal thickening is seen throughout the paranasal sinuses bilaterally. No air-fluid levels. Other: None. IMPRESSION: No acute intracranial abnormality. Chronic pansinusitis. Electronically Signed   By: Danae Orleans M.D.   On: 04/13/2023 14:57   DG Chest 2 View  Result Date: 04/13/2023 CLINICAL DATA:  Chest pain EXAM: CHEST - 2 VIEW COMPARISON:  Previous chest radiographs done on 08/31/2016 and CT done on 11/21/2017 FINDINGS: Transverse diameter of heart is increased. There is soft tissue fullness in retrocardiac region suggesting moderate sized fixed hiatal hernia. There are no signs of pulmonary edema or focal pulmonary consolidation. There is no pleural effusion or pneumothorax. IMPRESSION:  Cardiomegaly. There are no signs of pulmonary edema or focal pulmonary consolidation. Fixed hiatal hernia. Electronically Signed   By: Ernie Avena M.D.   On: 04/13/2023 14:19    Procedures Procedures    Medications Ordered in ED Medications - No data to display  ED Course/ Medical Decision Making/ A&P Clinical Course as of 04/13/23 1825  Sun Apr 13, 2023  1708 CT Head Wo Contrast [JR]    Clinical Course User Index [JR] Gareth Eagle, PA-C                             Medical Decision Making Amount and/or Complexity of Data Reviewed Labs: ordered. Radiology: ordered. Decision-making details documented in ED Course.  Risk Decision regarding hospitalization.   Initial Impression and Ddx 62 year old well-appearing male presenting for chest pain.  Exam unremarkable.  DDx includes ACS, PE, stroke, pneumothorax, pneumonia and aortic dissection. Patient PMH that increases complexity of ED encounter:  history of hypertension, hyperlipidemia  Interpretation of Diagnostics I independent reviewed and interpreted the labs as followed: No acute derangement  - I independently  visualized the following imaging with scope of interpretation limited to determining acute life threatening conditions related to emergency care: CT head revealed no acute intracranial pathology, CT chest x-ray revealed cardiomegaly but otherwise nothing acute  -I personally reviewed and interpreted EKG which revealed normal sinus rhythm  Patient Reassessment and Ultimate Disposition/Management Chest pain persistent on serial assessments and with heart score 4.  Prompted consultation with cardiology.  Spoke to Dr. Orson Aloe who advised to admit for observation and to trend his problems overnight.  Did send a D-dimer to evaluate for possible PE but have low suspicion given the character of his pain.  Admitted to hospital service with Dr. Wendall Stade.  Patient management required discussion with the following services or consulting groups:  Hospitalist Service  Complexity of Problems Addressed Acute complicated illness or Injury  Additional Data Reviewed and Analyzed Further history obtained from: Further history from spouse/family member, Past medical history and medications listed in the EMR, and Prior ED visit notes  Patient Encounter Risk Assessment Consideration of hospitalization         Final Clinical Impression(s) / ED Diagnoses Final diagnoses:  Chest pain, unspecified type    Rx / DC Orders ED Discharge Orders     None         Gareth Eagle, PA-C 04/13/23 1825    Jacalyn Lefevre, MD 04/16/23 1616

## 2023-04-13 NOTE — H&P (Signed)
History and Physical    [name]:[id number] DOB: Jun 17, 1961 DOA: 04/13/2023  PCP: Benita Stabile, MD   Patient coming from: home  I have personally briefly reviewed patient's old medical records in [facility]  Chief Complaint: Chest pain  HPI: Anthony Anderson is a 62 y.o. male with medical history significant for hypertension, prostate cancer, OSA, kidney stones. Patient presented to ED with complaints of mid chest pains started about 2 days ago, constant, radiating down his left arm.  Reports chest pain has varied in from burning sensation to sharp pains.  Not related to activity.  Chest pain is improved but persistent chest pain since being in the ED today.   He reports complaints of left arm pain for the past 2 weeks.  Left arm pain is related to position.  Reports over the past several weeks, he would have some mild difficulty breathing if he overexerts himself, but no chest pain.  History of smoking over 40 years ago.  Family history of heart attacks in his father when he was in his 58s.  ED Course: Stable vitals. Troponin <2, 2.  EKG with no acute abnormalities.  Chest x-ray negative for acute abnormality.  CT also without acute abnormality.  ED provider talked to cardiologist, with heart score 5, recommend admission will see in consult in AM.  Review of Systems: As per HPI all other systems reviewed and negative.  Past Medical History:  Diagnosis Date   Cancer [facility])    prostate, currently watching for any changes   Cholelithiasis    Dyspnea 2015   Dysrhythmia    GERD (gastroesophageal reflux disease)    History of hiatal hernia    History of kidney stones    Hyperlipidemia    Hypertension    Kidney stones    PSA elevation    Sleep apnea    uses CPAP    Past Surgical History:  Procedure Laterality Date   CARDIAC CATHETERIZATION     COLONOSCOPY WITH PROPOFOL N/A 02/06/2022   Procedure: COLONOSCOPY WITH PROPOFOL;  Surgeon: Dolores Frame, MD;   Location: [facility];  Service: Gastroenterology;  Laterality: N/A;  10:00   CYSTOSCOPY W/ URETERAL STENT REMOVAL Right 02/24/2020   Procedure: CYSTOSCOPY WITH STENT REMOVAL;  Surgeon: Marcine Matar, MD;  Location: [facility];  Service: Urology;  Laterality: Right;  1 HR   CYSTOSCOPY WITH RETROGRADE PYELOGRAM, URETEROSCOPY AND STENT PLACEMENT Right 02/24/2020   Procedure: CYSTOSCOPY WITH RETROGRADE PYELOGRAM, URETEROSCOPY AND STENT PLACEMENT;  Surgeon: Marcine Matar, MD;  Location: [facility];  Service: Urology;  Laterality: Right;   CYSTOSCOPY WITH STENT PLACEMENT Right 02/07/2020   Procedure: CYSTOSCOPY RIGHT RETROGRADE PYLEOGRAM WITH RIGHT STENT PLACEMENT;  Surgeon: Noel Christmas, MD;  Location: [facility];  Service: Urology;  Laterality: Right;   HOLMIUM LASER APPLICATION Right 02/24/2020   Procedure: HOLMIUM LASER APPLICATION;  Surgeon: Marcine Matar, MD;  Location: [facility];  Service: Urology;  Laterality: Right;   LEFT HEART CATHETERIZATION WITH CORONARY ANGIOGRAM N/A 10/26/2014   Procedure: LEFT HEART CATHETERIZATION WITH CORONARY ANGIOGRAM;  Surgeon: Lesleigh Noe, MD;  Location: [facility] CATH LAB;  Service: Cardiovascular;  Laterality: N/A;   LITHOTRIPSY     MASS EXCISION Left 01/09/2018   Procedure: EXCISION SUBCUTANEOUS MASS, CHEST WALL;  Surgeon: Franky Macho, MD;  Location: [facility];  Service: General;  Laterality: Left;   NASAL SINUS SURGERY     PELVIC LYMPH NODE DISSECTION Bilateral 09/11/2018   Procedure: PELVIC LYMPH  NODE DISSECTION;  Surgeon: Crist Fat, MD;  Location: [facility];  Service: Urology;  Laterality: Bilateral;   POLYPECTOMY  02/06/2022   Procedure: POLYPECTOMY;  Surgeon: Dolores Frame, MD;  Location: [facility];  Service: Gastroenterology;;   ROBOT ASSISTED LAPAROSCOPIC RADICAL PROSTATECTOMY N/A 09/11/2018   Procedure: XI ROBOTIC ASSISTED LAPAROSCOPIC RADICAL PROSTATECTOMY;  Surgeon: Crist Fat, MD;  Location: [facility];  Service: Urology;   Laterality: N/A;     reports that he quit smoking about 40 years ago. His smoking use included cigarettes. He has a 10.00 pack-year smoking history. He has been exposed to tobacco smoke. He has never used smokeless tobacco. He reports that he does not drink alcohol and does not use drugs.  No Known Allergies  Family History  Problem Relation Age of Onset   CAD Mother    CAD Father    Prostate cancer Father     Prior to Admission medications   Medication Sig Start Date End Date Taking? Authorizing Provider  albuterol (VENTOLIN HFA) 108 (90 Base) MCG/ACT inhaler Inhale 1-2 puffs into the lungs every 6 (six) hours as needed for wheezing or shortness of breath.  09/30/19  Yes [provider]  allopurinol (ZYLOPRIM) 100 MG tablet Take 1 tablet by mouth daily.   Yes [provider]  Artificial Tear Solution (SOOTHE XP OP) Place 1 drop into both eyes at bedtime as needed (for dry eyes).   Yes [provider]  aspirin EC 81 MG tablet Take 162 mg by mouth as needed (for chest pain). Swallow whole.   Yes [provider]  atorvastatin (LIPITOR) 40 MG tablet Take 1 tablet by mouth daily.   Yes [provider]  colchicine 0.6 MG tablet Take 0.6 mg by mouth 2 (two) times daily as needed (gout). 10/02/21  Yes [provider]  ibuprofen (IBU) 400 MG tablet Take 400 mg by mouth every 8 (eight) hours as needed for moderate pain.   Yes [provider]  metoprolol succinate (TOPROL-XL) 25 MG 24 hr tablet Take 25 mg by mouth at bedtime.  06/28/19  Yes [provider]  Multiple Vitamin (MULTIVITAMIN WITH MINERALS) TABS tablet Take 1 tablet by mouth daily.   Yes [provider]  olmesartan (BENICAR) 40 MG tablet Take 40 mg by mouth daily. 11/29/19  Yes [provider]  Omega-3 Fatty Acids (FISH OIL) 1200 MG CAPS Take 1,200 mg by mouth daily.   Yes [provider]  pantoprazole (PROTONIX) 40 MG tablet Take 40 mg by mouth  daily.   Yes [provider]  sildenafil (REVATIO) 20 MG tablet Take 60 mg by mouth daily as needed (ED).   Yes [provider]  sodium chloride (OCEAN) 0.65 % SOLN nasal spray Place 1 spray into both nostrils as needed for congestion.   Yes [provider]    Physical Exam: Vitals:   04/13/23 1537 04/13/23 1545 04/13/23 1645 04/13/23 1700  BP: (!) 151/98 (!) 129/92 (!) 151/99 (!) 140/98  Pulse: 69 73 71 81  Resp: (!) 21 18 19  (!) 23  Temp:      TempSrc:      SpO2: 95% 96% 97% 95%  Weight:      Height:        Constitutional: NAD, calm, comfortable Vitals:   04/13/23 1537 04/13/23 1545 04/13/23 1645 04/13/23 1700  BP: (!) 151/98 (!) 129/92 (!) 151/99 (!) 140/98  Pulse: 69 73 71 81  Resp: (!) 21 18 19  (!)  23  Temp:      TempSrc:      SpO2: 95% 96% 97% 95%  Weight:      Height:       Eyes: PERRL, lids and conjunctivae normal ENMT: Mucous membranes are moist.  Neck: normal, supple, no masses, no thyromegaly Respiratory: clear to auscultation bilaterally, no wheezing, no crackles. Normal respiratory effort. No accessory muscle use.  Cardiovascular: Regular rate and rhythm, no murmurs / rubs / gallops. No extremity edema. 2+ pedal pulses. No carotid bruits.  Abdomen: no tenderness, no masses palpated. No hepatosplenomegaly. Bowel sounds positive.  Musculoskeletal: no clubbing / cyanosis. No joint deformity upper and lower extremities.  Skin: no rashes, lesions, ulcers. No induration Neurologic: No facial asymmetry, speech fluent, no aphasia, moving extremities spontaneously Psychiatric: Normal judgment and insight. Alert and oriented x 3. Normal mood.   Labs on Admission: I have personally reviewed following labs and imaging studies  CBC: Recent Labs  Lab 04/13/23 1334  WBC 4.9  HGB 17.2*  HCT 50.8  MCV 87.3  PLT 186   Basic Metabolic Panel: Recent Labs  Lab 04/13/23 1334  NA 138  K 3.8  CL 107  CO2 22  GLUCOSE 101*  BUN 17   CREATININE 1.22  CALCIUM 9.3   Radiological Exams on Admission: CT Head Wo Contrast  Result Date: 04/13/2023 CLINICAL DATA:  Acute neurologic deficit.  Suspected stroke. EXAM: CT HEAD WITHOUT CONTRAST TECHNIQUE: Contiguous axial images were obtained from the base of the skull through the vertex without intravenous contrast. RADIATION DOSE REDUCTION: This exam was performed according to the departmental dose-optimization program which includes automated exposure control, adjustment of the mA and/or kV according to patient size and/or use of iterative reconstruction technique. COMPARISON:  11/13/2014 FINDINGS: Brain: No evidence of intracranial hemorrhage, acute infarction, hydrocephalus, extra-axial collection, or mass lesion/mass effect. Vascular:  No hyperdense vessel or other acute findings. Skull: No evidence of fracture or other significant bone abnormality. Sinuses/Orbits: No acute findings. Diffuse mucosal thickening is seen throughout the paranasal sinuses bilaterally. No air-fluid levels. Other: None. IMPRESSION: No acute intracranial abnormality. Chronic pansinusitis. Electronically Signed   By: Danae Orleans M.D.   On: 04/13/2023 14:57   DG Chest 2 View  Result Date: 04/13/2023 CLINICAL DATA:  Chest pain EXAM: CHEST - 2 VIEW COMPARISON:  Previous chest radiographs done on 08/31/2016 and CT done on 11/21/2017 FINDINGS: Transverse diameter of heart is increased. There is soft tissue fullness in retrocardiac region suggesting moderate sized fixed hiatal hernia. There are no signs of pulmonary edema or focal pulmonary consolidation. There is no pleural effusion or pneumothorax. IMPRESSION: Cardiomegaly. There are no signs of pulmonary edema or focal pulmonary consolidation. Fixed hiatal hernia. Electronically Signed   By: Ernie Avena M.D.   On: 04/13/2023 14:19    EKG: Independently reviewed.  Sinus rhythm, rate 80, QTc 438.  No significant abnormalities or change from  prior.  Assessment/Plan Principal Problem:   Chest pain Active Problems:   Essential hypertension   Hyperlipidemia  Assessment and Plan: * Chest pain To rule out cardiac etiology.  Troponin unremarkable.  EKG unchanged.  Remote history of smoking over 40 years ago.  Positive family history of cardiac disease in father in his 78s.  Cardiac cath 2015, no angiographically significant coronary disease noted. -Echo -N.p.o. midnight -Consult in a.m. -Aspirin 325 x 1, continue 81 mg daily -Resume Lipitor 40 mg daily - PRN Nitro  Hyperlipidemia Resume statins.  Essential hypertension Stable. -Resume metoprolol, Olmesartan  DVT prophylaxis: Lovenox Code Status: FULL- Confirmed with patient and spouse at bedside Family Communication: Spouse at bedside  disposition Plan: ~ 2 days Consults called: Cardiology Admission status: Obs tele  I certify that at the point of admission it is my clinical judgment that the patient will require inpatient hospital care spanning beyond 2 midnights from the point of admission due to high intensity of service, high risk for further deterioration and high frequency of surveillance required.  Author: Onnie Boer, MD 04/13/2023 9:48 PM  For on call review [email or web address].

## 2023-04-13 NOTE — Assessment & Plan Note (Signed)
Resume statins 

## 2023-04-14 ENCOUNTER — Observation Stay (HOSPITAL_BASED_OUTPATIENT_CLINIC_OR_DEPARTMENT_OTHER): Payer: BC Managed Care – PPO

## 2023-04-14 ENCOUNTER — Ambulatory Visit (HOSPITAL_COMMUNITY): Admission: RE | Admit: 2023-04-14 | Payer: BC Managed Care – PPO | Source: Ambulatory Visit

## 2023-04-14 ENCOUNTER — Other Ambulatory Visit (HOSPITAL_COMMUNITY): Payer: Self-pay | Admitting: *Deleted

## 2023-04-14 DIAGNOSIS — R079 Chest pain, unspecified: Secondary | ICD-10-CM | POA: Diagnosis not present

## 2023-04-14 DIAGNOSIS — E669 Obesity, unspecified: Secondary | ICD-10-CM

## 2023-04-14 DIAGNOSIS — E66812 Obesity, class 2: Secondary | ICD-10-CM | POA: Insufficient documentation

## 2023-04-14 DIAGNOSIS — E785 Hyperlipidemia, unspecified: Secondary | ICD-10-CM | POA: Diagnosis not present

## 2023-04-14 DIAGNOSIS — I10 Essential (primary) hypertension: Secondary | ICD-10-CM | POA: Diagnosis not present

## 2023-04-14 DIAGNOSIS — R0789 Other chest pain: Secondary | ICD-10-CM | POA: Diagnosis not present

## 2023-04-14 LAB — LIPID PANEL
Cholesterol: 170 mg/dL (ref 0–200)
HDL: 33 mg/dL — ABNORMAL LOW (ref >40)
LDL Cholesterol: 95 mg/dL (ref 0–99)
Total CHOL/HDL Ratio: 5.2 RATIO
Triglycerides: 209 mg/dL — ABNORMAL HIGH (ref <150)
VLDL: 42 mg/dL — ABNORMAL HIGH (ref 0–40)

## 2023-04-14 LAB — NM MYOCAR MULTI W/SPECT W/WALL MOTION / EF
LV dias vol: 120 mL (ref 62–150)
LV sys vol: 55 mL
Nuc Stress EF: 54 %
Peak HR: 100 {beats}/min
RATE: 0.5
Rest HR: 60 {beats}/min
Rest Nuclear Isotope Dose: 10.9 mCi
SDS: 0
SRS: 0
SSS: 0
ST Depression (mm): 0 mm
Stress Nuclear Isotope Dose: 30.3 mCi
TID: 1.07

## 2023-04-14 LAB — HEMOGLOBIN A1C
Hgb A1c MFr Bld: 5.3 % (ref 4.8–5.6)
Mean Plasma Glucose: 105.41 mg/dL

## 2023-04-14 LAB — ECHOCARDIOGRAM COMPLETE
AR max vel: 2.21 cm2
AV Area VTI: 1.88 cm2
AV Area mean vel: 2.07 cm2
AV Mean grad: 7 mmHg
AV Peak grad: 12.5 mmHg
Ao pk vel: 1.77 m/s
Area-P 1/2: 3.21 cm2
Height: 70 in
S' Lateral: 3 cm
Weight: 4172.87 oz

## 2023-04-14 LAB — CK: Total CK: 64 U/L (ref 49–397)

## 2023-04-14 LAB — FOLATE: Folate: 22.2 ng/mL (ref >5.9)

## 2023-04-14 LAB — VITAMIN B12: Vitamin B-12: 447 pg/mL (ref 180–914)

## 2023-04-14 LAB — HIV ANTIBODY (ROUTINE TESTING W REFLEX): HIV Screen 4th Generation wRfx: NONREACTIVE

## 2023-04-14 MED ORDER — TECHNETIUM TC 99M TETROFOSMIN IV KIT
10.9000 | PACK | Freq: Once | INTRAVENOUS | Status: AC | PRN
  Administered 2023-04-14: 10.9 via INTRAVENOUS

## 2023-04-14 MED ORDER — SODIUM CHLORIDE FLUSH 0.9 % IV SOLN
INTRAVENOUS | Status: AC
  Administered 2023-04-14: 10 mL via INTRAVENOUS
  Filled 2023-04-14: qty 10

## 2023-04-14 MED ORDER — PERFLUTREN LIPID MICROSPHERE
1.0000 mL | INTRAVENOUS | Status: DC | PRN
  Administered 2023-04-14: 4 mL via INTRAVENOUS

## 2023-04-14 MED ORDER — REGADENOSON 0.4 MG/5ML IV SOLN
INTRAVENOUS | Status: AC
  Administered 2023-04-14: 0.4 mg via INTRAVENOUS
  Filled 2023-04-14: qty 5

## 2023-04-14 MED ORDER — EZETIMIBE 10 MG PO TABS
10.0000 mg | ORAL_TABLET | Freq: Every day | ORAL | Status: DC
  Administered 2023-04-14: 10 mg via ORAL
  Filled 2023-04-14: qty 1

## 2023-04-14 MED ORDER — TECHNETIUM TC 99M TETROFOSMIN IV KIT
30.3000 | PACK | Freq: Once | INTRAVENOUS | Status: AC | PRN
  Administered 2023-04-14: 30.3 via INTRAVENOUS

## 2023-04-14 MED ORDER — REGADENOSON 0.4 MG/5ML IV SOLN
0.4000 mg | Freq: Once | INTRAVENOUS | Status: AC
  Filled 2023-04-14: qty 5

## 2023-04-14 NOTE — TOC CM/SW Note (Signed)
Transition of Care [facility]) - Inpatient Brief Assessment   Patient Details  Name: Anthony Anderson MRN: [id number] Date of Birth: [birth year]  Transition of Care [facility]) CM/SW Contact:    Villa Herb, LCSWA Phone Number: 04/14/2023, 10:44 AM   Clinical Narrative:  Transition of Care Department [facility]) has reviewed patient and no TOC needs have been identified at this time. We will continue to monitor patient advancement through interdisciplinary progression rounds. If new patient transition needs arise, please place a TOC consult.  Transition of Care Asessment: Insurance and Status: Insurance coverage has been reviewed Patient has primary care physician: Yes Home environment has been reviewed: from home Prior level of function:: independent Prior/Current Home Services: No current home services Social Determinants of Health Reivew: SDOH reviewed no interventions necessary Readmission risk has been reviewed: Yes Transition of care needs: no transition of care needs at this time

## 2023-04-14 NOTE — Progress Notes (Signed)
PROGRESS NOTE  Anthony Anderson ZOX:[id number] DOB: [birth year] DOA: 04/13/2023 PCP: Benita Stabile, MD  Brief History:  62 year old male with a history of hypertension, prostate cancer, OSA, nephrolithiasis presenting with substernal chest pain that began on 04/10/2023.  The patient also has been complaining of left arm pain around his bicep and tricep area and antecubital fossa for about a week.  He states that his chest pain has been off and on lasting anywhere from 5 to 30 minutes.  It occurs with exertion as well as at rest.  He describes it as a burning sensation as well as occasional sharp pain.  He had some mild shortness of breath but denies any fevers, chills, cough, hemoptysis.  He has not had any nausea, vomiting, direct abdominal pain, dysuria, hematuria.  He denies any recent injuries or falls.  He denies any heavy lifting. In addition, the patient states that he has been having left arm pain as described above even without the chest discomfort.  He has had some intermittent neck pain as well. In the ED, the patient had low-grade temperature of 99.5 F.  He was hemodynamically stable.  Oxygen saturation was 1% room air.  WBC 4.9, hemoglobin 17.2, platelets 282,000.  Sodium 138, potassium 3.8, bicarbonate 22, serum creatinine 1.22.  D-dimer 0.42.  EKG shows sinus rhythm without ST ST wave changes.  Troponin <2 then 2.  Chest x-ray was negative.  Cardiology was consulted.   Assessment/Plan:  Chest pain -Troponins unremarkable -Very low suspicion of cardiac etiology -Personally reviewed EKG--sinus rhythm, no ST-T wave change -Cardiac catheterization 10/26/2014 which demonstrated a 30% mid LAD stenosis.  -Nuclear medicine stress test 09/09/2019--low risk, EF 55-65% -Echocardiogram  Left arm pain -MRI cervical spine -CPK -B12  Essential hypertension -Continue amlodipine and irbesartan  Mixed hyperlipidemia -continue statin  Class II obesity -BMI 37.42 -Lifestyle  modification        Family Communication:  no Family at bedside  Consultants:  cardiology  Code Status:  FULL   DVT Prophylaxis:  [place] Lovenox   Procedures: As Listed in Progress Note Above  Antibiotics: None       Subjective: Presently without any chest pain or shortness of breath.  Denies any fevers, chills, nausea, vomiting, diarrhea, abdominal pain.  Objective: Vitals:   04/13/23 2010 04/13/23 2341 04/14/23 0046 04/14/23 0411  BP: (!) 150/90 (!) 128/90  128/82  Pulse: 62 (!) 56 (!) 55 (!) 54  Resp:  18 16 18   Temp:  97.8 F (36.6 C)    TempSrc:      SpO2: 96% 95% 96% 97%  Weight:      Height:       No intake or output data in the 24 hours ending 04/14/23 0749 Weight change:  Exam:  General:  Pt is alert, follows commands appropriately, not in acute distress HEENT: No icterus, No thrush, No neck mass, [place]/AT Cardiovascular: RRR, S1/S2, no rubs, no gallops Respiratory: CTA bilaterally, no wheezing, no crackles, no rhonchi Abdomen: Soft/+BS, non tender, non distended, no guarding Extremities: No edema, No lymphangitis, No petechiae, No rashes, no synovitis Neuro:  CN II-XII intact, strength 4/5 in RUE, RLE, strength 4/5 LUE, LLE; sensation intact bilateral; no dysmetria; babinski equivocal    Data Reviewed: I have personally reviewed following labs and imaging studies Basic Metabolic Panel: Recent Labs  Lab 04/13/23 1334  NA 138  K 3.8  CL 107  CO2 22  GLUCOSE 101*  BUN 17  CREATININE 1.22  CALCIUM 9.3   Liver Function Tests: No results for input(s): "AST", "ALT", "ALKPHOS", "BILITOT", "PROT", "ALBUMIN" in the last 168 hours. No results for input(s): "LIPASE", "AMYLASE" in the last 168 hours. No results for input(s): "AMMONIA" in the last 168 hours. Coagulation Profile: No results for input(s): "INR", "PROTIME" in the last 168 hours. CBC: Recent Labs  Lab 04/13/23 1334  WBC 4.9  HGB 17.2*  HCT 50.8  MCV 87.3  PLT 186   Cardiac  Enzymes: No results for input(s): "CKTOTAL", "CKMB", "CKMBINDEX", "TROPONINI" in the last 168 hours. BNP: Invalid input(s): "POCBNP" CBG: No results for input(s): "GLUCAP" in the last 168 hours. HbA1C: No results for input(s): "HGBA1C" in the last 72 hours. Urine analysis:    Component Value Date/Time   COLORURINE YELLOW 02/06/2020 2042   APPEARANCEUR HAZY (A) 02/06/2020 2042   LABSPEC 1.021 02/06/2020 2042   PHURINE 5.0 02/06/2020 2042   GLUCOSEU NEGATIVE 02/06/2020 2042   HGBUR MODERATE (A) 02/06/2020 2042   BILIRUBINUR NEGATIVE 02/06/2020 2042   KETONESUR NEGATIVE 02/06/2020 2042   PROTEINUR 30 (A) 02/06/2020 2042   UROBILINOGEN 0.2 05/23/2012 1103   NITRITE POSITIVE (A) 02/06/2020 2042   LEUKOCYTESUR LARGE (A) 02/06/2020 2042   Sepsis Labs: @LABRCNTIP (procalcitonin:4,lacticidven:4) )No results found for this or any previous visit (from the past 240 hour(s)).   Scheduled Meds:  allopurinol  100 mg Oral Daily   aspirin EC  81 mg Oral Daily   atorvastatin  40 mg Oral Daily   enoxaparin (LOVENOX) injection  60 mg Subcutaneous QHS   irbesartan  300 mg Oral Daily   metoprolol succinate  25 mg Oral QHS   pantoprazole  40 mg Oral Daily   Continuous Infusions:  Procedures/Studies: CT Head Wo Contrast  Result Date: 04/13/2023 CLINICAL DATA:  Acute neurologic deficit.  Suspected stroke. EXAM: CT HEAD WITHOUT CONTRAST TECHNIQUE: Contiguous axial images were obtained from the base of the skull through the vertex without intravenous contrast. RADIATION DOSE REDUCTION: This exam was performed according to the departmental dose-optimization program which includes automated exposure control, adjustment of the mA and/or kV according to patient size and/or use of iterative reconstruction technique. COMPARISON:  11/13/2014 FINDINGS: Brain: No evidence of intracranial hemorrhage, acute infarction, hydrocephalus, extra-axial collection, or mass lesion/mass effect. Vascular:  No hyperdense  vessel or other acute findings. Skull: No evidence of fracture or other significant bone abnormality. Sinuses/Orbits: No acute findings. Diffuse mucosal thickening is seen throughout the paranasal sinuses bilaterally. No air-fluid levels. Other: None. IMPRESSION: No acute intracranial abnormality. Chronic pansinusitis. Electronically Signed   By: Danae Orleans M.D.   On: 04/13/2023 14:57   DG Chest 2 View  Result Date: 04/13/2023 CLINICAL DATA:  Chest pain EXAM: CHEST - 2 VIEW COMPARISON:  Previous chest radiographs done on 08/31/2016 and CT done on 11/21/2017 FINDINGS: Transverse diameter of heart is increased. There is soft tissue fullness in retrocardiac region suggesting moderate sized fixed hiatal hernia. There are no signs of pulmonary edema or focal pulmonary consolidation. There is no pleural effusion or pneumothorax. IMPRESSION: Cardiomegaly. There are no signs of pulmonary edema or focal pulmonary consolidation. Fixed hiatal hernia. Electronically Signed   By: Ernie Avena M.D.   On: 04/13/2023 14:19    Catarina Hartshorn, DO  [facility]  If 7PM-7AM, please contact night-coverage [email or web address] Password [facility] 04/14/2023, 7:49 AM   LOS: 0 days

## 2023-04-14 NOTE — Progress Notes (Signed)
Patient rested through the night, wearing CPAP, no overnight events.

## 2023-04-14 NOTE — Plan of Care (Signed)
  Problem: Health Behavior/Discharge Planning: Goal: Ability to manage health-related needs will improve Outcome: Progressing   Problem: Clinical Measurements: Goal: Ability to maintain clinical measurements within normal limits will improve Outcome: Progressing   Problem: Activity: Goal: Risk for activity intolerance will decrease Outcome: Progressing   

## 2023-04-14 NOTE — Hospital Course (Signed)
62 year old male with a history of hypertension, prostate cancer, OSA, nephrolithiasis presenting with substernal chest pain that began on 04/10/2023.  The patient also has been complaining of left arm pain around his bicep and tricep area and antecubital fossa for about a week.  He states that his chest pain has been off and on lasting anywhere from 5 to 30 minutes.  It occurs with exertion as well as at rest.  He describes it as a burning sensation as well as occasional sharp pain.  He had some mild shortness of breath but denies any fevers, chills, cough, hemoptysis.  He has not had any nausea, vomiting, direct abdominal pain, dysuria, hematuria.  He denies any recent injuries or falls.  He denies any heavy lifting. In addition, the patient states that he has been having left arm pain as described above even without the chest discomfort.  He has had some intermittent neck pain as well. In the ED, the patient had low-grade temperature of 99.5 F.  He was hemodynamically stable.  Oxygen saturation was 1% room air.  WBC 4.9, hemoglobin 17.2, platelets 282,000.  Sodium 138, potassium 3.8, bicarbonate 22, serum creatinine 1.22.  D-dimer 0.42.  EKG shows sinus rhythm without ST ST wave changes.  Troponin <2 then 2.  Chest x-ray was negative.  Cardiology was consulted.

## 2023-04-14 NOTE — Progress Notes (Signed)
Ng Discharge Note  Admit Date:  04/13/2023 Discharge date: 04/14/2023   Bur[place] Gauze to be D/C'd Home per MD order.  AVS completed. Patient/caregiver able to verbalize understanding.  Discharge Medication: Allergies as of 04/14/2023   No Known Allergies      Medication List     TAKE these medications    albuterol 108 (90 Base) MCG/ACT inhaler Commonly known as: VENTOLIN HFA Inhale 1-2 puffs into the lungs every 6 (six) hours as needed for wheezing or shortness of breath.   allopurinol 100 MG tablet Commonly known as: ZYLOPRIM Take 1 tablet by mouth daily.   aspirin EC 81 MG tablet Take 162 mg by mouth as needed (for chest pain). Swallow whole.   atorvastatin 40 MG tablet Commonly known as: LIPITOR Take 1 tablet by mouth daily.   colchicine 0.6 MG tablet Take 0.6 mg by mouth 2 (two) times daily as needed (gout).   Fish Oil 1200 MG Caps Take 1,200 mg by mouth daily.   IBU 400 MG tablet Generic drug: ibuprofen Take 400 mg by mouth every 8 (eight) hours as needed for moderate pain.   metoprolol succinate 25 MG 24 hr tablet Commonly known as: TOPROL-XL Take 25 mg by mouth at bedtime.   multivitamin with minerals Tabs tablet Take 1 tablet by mouth daily.   olmesartan 40 MG tablet Commonly known as: BENICAR Take 40 mg by mouth daily.   pantoprazole 40 MG tablet Commonly known as: PROTONIX Take 40 mg by mouth daily.   sildenafil 20 MG tablet Commonly known as: REVATIO Take 60 mg by mouth daily as needed (ED).   sodium chloride 0.65 % Soln nasal spray Commonly known as: OCEAN Place 1 spray into both nostrils as needed for congestion.   SOOTHE XP OP Place 1 drop into both eyes at bedtime as needed (for dry eyes).        Discharge Assessment: Vitals:   04/14/23 0411 04/14/23 0811  BP: 128/82 (!) 147/94  Pulse: (!) 54 (!) 53  Resp: 18 18  Temp:  97.6 F (36.4 C)  SpO2: 97% 99%   Skin clean, dry and intact without evidence of skin break down, no  evidence of skin tears noted. IV catheter discontinued intact. Site without signs and symptoms of complications - no redness or edema noted at insertion site, patient denies c/o pain - only slight tenderness at site.  Dressing with slight pressure applied.  D/c Instructions-Education: Discharge instructions given to patient/family with verbalized understanding. D/c education completed with patient/family including follow up instructions, medication list, d/c activities limitations if indicated, with other d/c instructions as indicated by MD - patient able to verbalize understanding, all questions fully answered. Patient instructed to return to ED, call 911, or call MD for any changes in condition.  Patient escorted via WC, and D/C home via private auto.  Cristal Ford, LPN 02/03/8118 1:47 PM

## 2023-04-14 NOTE — Discharge Summary (Signed)
Physician Discharge Summary   Patient: Anthony Anderson MRN: [id number] DOB: [birth year]  Admit date:     04/13/2023  Discharge date: 04/14/23  Discharge Physician: Onalee Hua Brad Mcgaughy   PCP: Benita Stabile, MD   Recommendations at discharge:   Please follow up with primary care provider within 1-2 weeks  Please repeat BMP and CBC in one week  Please  schedule MRI cervical spine for patient to rule out radiculopathy     Hospital Course: 62 year old male with a history of hypertension, prostate cancer, OSA, nephrolithiasis presenting with substernal chest pain that began on 04/10/2023.  The patient also has been complaining of left arm pain around his bicep and tricep area and antecubital fossa for about a week.  He states that his chest pain has been off and on lasting anywhere from 5 to 30 minutes.  It occurs with exertion as well as at rest.  He describes it as a burning sensation as well as occasional sharp pain.  He had some mild shortness of breath but denies any fevers, chills, cough, hemoptysis.  He has not had any nausea, vomiting, direct abdominal pain, dysuria, hematuria.  He denies any recent injuries or falls.  He denies any heavy lifting. In addition, the patient states that he has been having left arm pain as described above even without the chest discomfort.  He has had some intermittent neck pain as well. In the ED, the patient had low-grade temperature of 99.5 F.  He was hemodynamically stable.  Oxygen saturation was 1% room air.  WBC 4.9, hemoglobin 17.2, platelets 282,000.  Sodium 138, potassium 3.8, bicarbonate 22, serum creatinine 1.22.  D-dimer 0.42.  EKG shows sinus rhythm without ST ST wave changes.  Troponin <2 then 2.  Chest x-ray was negative.  Cardiology was consulted.  Assessment and Plan:  Chest pain -Troponin unremarkable.   -Personally reviewed EKG--sinus rhythm, no ST-T wave change -Cardiac catheterization 10/26/2014 which demonstrated a 30% mid LAD stenosis.  -Lexiscan  09/09/2019--low risk, EF 55-65% -04/14/23 Echo EF 65-70%, no WMA, normal RVF, trivial MR/TR -04/14/23 Lexiscan myoview--low risk, EF 54% continue 81 mg daily -Resume Lipitor 40 mg daily  Left arm pain -MRI cervical spine>>patient did not want to stay in hospital to wait for MRI to be done;  he would prefer it be done as an outpatient -CPK 64 -B12--447 -request PCP to order MR cervical spine to rule out radiculopathy   Essential hypertension -Continue amlodipine and irbesartan   Mixed hyperlipidemia -continue statin -LDL 95   Class II obesity -BMI 37.42 -Lifestyle modification           Consultants: cardiology Procedures performed: none  Disposition: Home Diet recommendation:  Cardiac diet DISCHARGE MEDICATION: Allergies as of 04/14/2023   No Known Allergies      Medication List     TAKE these medications    albuterol 108 (90 Base) MCG/ACT inhaler Commonly known as: VENTOLIN HFA Inhale 1-2 puffs into the lungs every 6 (six) hours as needed for wheezing or shortness of breath.   allopurinol 100 MG tablet Commonly known as: ZYLOPRIM Take 1 tablet by mouth daily.   aspirin EC 81 MG tablet Take 162 mg by mouth as needed (for chest pain). Swallow whole.   atorvastatin 40 MG tablet Commonly known as: LIPITOR Take 1 tablet by mouth daily.   colchicine 0.6 MG tablet Take 0.6 mg by mouth 2 (two) times daily as needed (gout).   Fish Oil 1200 MG Caps Take 1,200 mg by  mouth daily.   IBU 400 MG tablet Generic drug: ibuprofen Take 400 mg by mouth every 8 (eight) hours as needed for moderate pain.   metoprolol succinate 25 MG 24 hr tablet Commonly known as: TOPROL-XL Take 25 mg by mouth at bedtime.   multivitamin with minerals Tabs tablet Take 1 tablet by mouth daily.   olmesartan 40 MG tablet Commonly known as: BENICAR Take 40 mg by mouth daily.   pantoprazole 40 MG tablet Commonly known as: PROTONIX Take 40 mg by mouth daily.   sildenafil 20 MG  tablet Commonly known as: REVATIO Take 60 mg by mouth daily as needed (ED).   sodium chloride 0.65 % Soln nasal spray Commonly known as: OCEAN Place 1 spray into both nostrils as needed for congestion.   SOOTHE XP OP Place 1 drop into both eyes at bedtime as needed (for dry eyes).        Discharge Exam: Filed Weights   04/13/23 1254 04/13/23 1941  Weight: 117.9 kg 118.3 kg   HEENT:  [place]/AT, No thrush, no icterus CV:  RRR, no rub, no S3, no S4 Lung:  CTA, no wheeze, no rhonchi Abd:  soft/+BS, NT Ext:  No edema, no lymphangitis, no synovitis, no rash Neuro:  CN II-XII intact, strength 4/5 in RUE, RLE, strength 4/5 LUE, LLE; sensation intact bilateral; no dysmetria; babinski equivocal    Condition at discharge: stable  The results of significant diagnostics from this hospitalization (including imaging, microbiology, ancillary and laboratory) are listed below for reference.   Imaging Studies: ECHOCARDIOGRAM COMPLETE  Result Date: 04/14/2023    ECHOCARDIOGRAM REPORT   Patient Name:   Anthony Anderson Date of Exam: 04/14/2023 Medical Rec #:  [id number]      Height:       70.0 in Accession #:    [id number]     Weight:       260.8 lb Date of Birth:  [birth year]      BSA:          2.337 m Patient Age:    61 years       BP:           147/94 mmHg Patient Gender: M              HR:           53 bpm. [facility] Procedure: 2D Echo, Cardiac Doppler and Color Doppler Indications:    Chest Pain R07.9  History:        Patient has prior history of Echocardiogram examinations, most                 recent 08/09/2019. Risk Factors:Hypertension and Dyslipidemia.                 Cancer (HCC) (From Hx).  Sonographer:    Celesta Gentile RCS Referring Phys: [phone] Heloise Beecham EMOKPAE IMPRESSIONS  1. Left ventricular ejection fraction, by estimation, is 65 to 70%. The left ventricle has normal function. The left ventricle has no regional wall motion abnormalities. Left ventricular diastolic parameters  were normal.  2. Right ventricular systolic function is normal. The right ventricular size is normal.  3. Left atrial size was mildly dilated.  4. The mitral valve is normal in structure. Trivial mitral valve regurgitation.  5. The aortic valve is tricuspid. Aortic valve regurgitation is not visualized. Aortic valve sclerosis is present, with no evidence of aortic valve stenosis.  6. The inferior vena cava is normal  in size with greater than 50% respiratory variability, suggesting right atrial pressure of 3 mmHg. FINDINGS  Left Ventricle: Left ventricular ejection fraction, by estimation, is 65 to 70%. The left ventricle has normal function. The left ventricle has no regional wall motion abnormalities. The left ventricular internal cavity size was normal in size. There is  no left ventricular hypertrophy. Left ventricular diastolic parameters were normal. Right Ventricle: The right ventricular size is normal. Right vetricular wall thickness was not assessed. Right ventricular systolic function is normal. Left Atrium: Left atrial size was mildly dilated. Right Atrium: Right atrial size was normal in size. Pericardium: There is no evidence of pericardial effusion. Mitral Valve: The mitral valve is normal in structure. Trivial mitral valve regurgitation. Tricuspid Valve: The tricuspid valve is normal in structure. Tricuspid valve regurgitation is trivial. Aortic Valve: The aortic valve is tricuspid. Aortic valve regurgitation is not visualized. Aortic valve sclerosis is present, with no evidence of aortic valve stenosis. Aortic valve mean gradient measures 7.0 mmHg. Aortic valve peak gradient measures 12.5 mmHg. Aortic valve area, by VTI measures 1.88 cm. Pulmonic Valve: The pulmonic valve was normal in structure. Pulmonic valve regurgitation is not visualized. Aorta: The aortic root is normal in size and structure. Venous: The inferior vena cava is normal in size with greater than 50% respiratory variability,  suggesting right atrial pressure of 3 mmHg. IAS/Shunts: No atrial level shunt detected by color flow Doppler.  LEFT VENTRICLE PLAX 2D LVIDd:         5.00 cm   Diastology LVIDs:         3.00 cm   LV e' medial:    7.29 cm/s LV PW:         1.00 cm   LV E/e' medial:  10.8 LV IVS:        1.10 cm   LV e' lateral:   8.92 cm/s LVOT diam:     1.90 cm   LV E/e' lateral: 8.9 LV SV:         73 LV SV Index:   31 LVOT Area:     2.84 cm  RIGHT VENTRICLE TAPSE (M-mode): 2.5 cm LEFT ATRIUM             Index        RIGHT ATRIUM           Index LA diam:        3.70 cm 1.58 cm/m   RA Area:     14.70 cm LA Vol (A2C):   49.0 ml 20.97 ml/m  RA Volume:   36.00 ml  15.41 ml/m LA Vol (A4C):   91.5 ml 39.16 ml/m LA Biplane Vol: 71.8 ml 30.73 ml/m  AORTIC VALVE AV Area (Vmax):    2.21 cm AV Area (Vmean):   2.07 cm AV Area (VTI):     1.88 cm AV Vmax:           177.00 cm/s AV Vmean:          121.000 cm/s AV VTI:            0.391 m AV Peak Grad:      12.5 mmHg AV Mean Grad:      7.0 mmHg LVOT Vmax:         138.00 cm/s LVOT Vmean:        88.200 cm/s LVOT VTI:          0.259 m LVOT/AV VTI ratio: 0.66  AORTA Ao Root diam: 3.10 cm MITRAL  VALVE MV Area (PHT): 3.21 cm    SHUNTS MV Decel Time: 236 msec    Systemic VTI:  0.26 m MV E velocity: 78.95 cm/s  Systemic Diam: 1.90 cm MV A velocity: 70.55 cm/s MV E/A ratio:  1.12 Dietrich Pates MD Electronically signed by Dietrich Pates MD Signature Date/Time: 04/14/2023/3:20:20 PM    Final    NM Myocar Multi W/Spect Izetta Dakin Motion / EF  Result Date: 04/14/2023   Lexiscan stress shows no EKG changes to suggest ischemia   Myoview scan shows normal perfusion  No ischemia or scar   LVEF on gating calculated at 54%   Overall low risk study   CT Head Wo Contrast  Result Date: 04/13/2023 CLINICAL DATA:  Acute neurologic deficit.  Suspected stroke. EXAM: CT HEAD WITHOUT CONTRAST TECHNIQUE: Contiguous axial images were obtained from the base of the skull through the vertex without intravenous contrast.  RADIATION DOSE REDUCTION: This exam was performed according to the departmental dose-optimization program which includes automated exposure control, adjustment of the mA and/or kV according to patient size and/or use of iterative reconstruction technique. COMPARISON:  11/13/2014 FINDINGS: Brain: No evidence of intracranial hemorrhage, acute infarction, hydrocephalus, extra-axial collection, or mass lesion/mass effect. Vascular:  No hyperdense vessel or other acute findings. Skull: No evidence of fracture or other significant bone abnormality. Sinuses/Orbits: No acute findings. Diffuse mucosal thickening is seen throughout the paranasal sinuses bilaterally. No air-fluid levels. Other: None. IMPRESSION: No acute intracranial abnormality. Chronic pansinusitis. Electronically Signed   By: Danae Orleans M.D.   On: 04/13/2023 14:57   DG Chest 2 View  Result Date: 04/13/2023 CLINICAL DATA:  Chest pain EXAM: CHEST - 2 VIEW COMPARISON:  Previous chest radiographs done on 08/31/2016 and CT done on 11/21/2017 FINDINGS: Transverse diameter of heart is increased. There is soft tissue fullness in retrocardiac region suggesting moderate sized fixed hiatal hernia. There are no signs of pulmonary edema or focal pulmonary consolidation. There is no pleural effusion or pneumothorax. IMPRESSION: Cardiomegaly. There are no signs of pulmonary edema or focal pulmonary consolidation. Fixed hiatal hernia. Electronically Signed   By: Ernie Avena M.D.   On: 04/13/2023 14:19    Microbiology: Results for orders placed or performed during the hospital encounter of 02/21/20  SARS CORONAVIRUS 2 (Leviticus Harton 6-24 HRS) Nasopharyngeal Nasopharyngeal Swab     Status: None   Collection Time: 02/21/20 10:00 AM   Specimen: Nasopharyngeal Swab  Result Value Ref Range Status   SARS Coronavirus 2 NEGATIVE NEGATIVE Final    Comment: (NOTE) SARS-CoV-2 target nucleic acids are NOT DETECTED. The SARS-CoV-2 RNA is generally detectable in upper and  lower respiratory specimens during the acute phase of infection. Negative results do not preclude SARS-CoV-2 infection, do not rule out co-infections with other pathogens, and should not be used as the sole basis for treatment or other patient management decisions. Negative results must be combined with clinical observations, patient history, and epidemiological information. The expected result is Negative. Fact Sheet for Patients: HairSlick.no Fact Sheet for Healthcare Providers: quierodirigir.com This test is not yet approved or cleared by the [place] FDA and  has been authorized for detection and/or diagnosis of SARS-CoV-2 by FDA under an Emergency Use Authorization (EUA). This EUA will remain  in effect (meaning this test can be used) for the duration of the COVID-19 declaration under Section 56 4(b)(1) of the Act, 21 U.S.C. section 360bbb-3(b)(1), unless the authorization is terminated or revoked sooner. Performed at [facility], [address], [place],  [place] [address]     Labs: CBC: Recent Labs  Lab 04/13/23 1334  WBC 4.9  HGB 17.2*  HCT 50.8  MCV 87.3  PLT 186   Basic Metabolic Panel: Recent Labs  Lab 04/13/23 1334  NA 138  K 3.8  CL 107  CO2 22  GLUCOSE 101*  BUN 17  CREATININE 1.22  CALCIUM 9.3   Liver Function Tests: No results for input(s): "AST", "ALT", "ALKPHOS", "BILITOT", "PROT", "ALBUMIN" in the last 168 hours. CBG: No results for input(s): "GLUCAP" in the last 168 hours.  Discharge time spent: greater than 30 minutes.  Signed: Catarina Hartshorn, MD [facility] 04/14/2023

## 2023-04-14 NOTE — Progress Notes (Signed)
*  PRELIMINARY RESULTS* Echocardiogram 2D Echocardiogram has been performed with [facility].  Stacey Drain 04/14/2023, 3:13 PM

## 2023-04-14 NOTE — Consult Note (Addendum)
Cardiology Consultation   Patient ID: TEIGE GARFIAS MRN: [id number]; DOB: 1960/11/02  Admit date: 04/13/2023 Date of Consult: 04/14/2023  PCP:  Benita Stabile, MD   [facility] Providers Cardiologist:  None        Patient Profile:   Anthony Anderson is a 62 y.o. male with a hx of hypertension, sleep apnea, prostate cancer and hyperlipidemia.He underwent cardiac catheterization on 10/26/2014 which demonstrated a 30% mid LAD stenosis. who is being seen 04/14/2023 for the evaluation of chest pain at the request of Dr. Arbutus Leas.  History of Present Illness:   Mr. Stanziale presents with left arm pain for several weeks that has been constant. The past couple of days he's had chest burning off and on. Not worse with exertion but not doing much with left arm pain. No indigestion. Yest chest burning worsened and thought he'd pass out. Having MRI today. Echo ordered. Troponins negative, EKG unchanged.  Father MI 67's died of cancer in his 35's. Patient compliant with CPAP. BP controlled at home but up here.    Past Medical History:  Diagnosis Date   Cancer [facility])    prostate, currently watching for any changes   Cholelithiasis    Dyspnea 2015   Dysrhythmia    GERD (gastroesophageal reflux disease)    History of hiatal hernia    History of kidney stones    Hyperlipidemia    Hypertension    Kidney stones    PSA elevation    Sleep apnea    uses CPAP    Past Surgical History:  Procedure Laterality Date   CARDIAC CATHETERIZATION     COLONOSCOPY WITH PROPOFOL N/A 02/06/2022   Procedure: COLONOSCOPY WITH PROPOFOL;  Surgeon: Dolores Frame, MD;  [facility];  Service: Gastroenterology;  Laterality: N/A;  10:00   CYSTOSCOPY W/ URETERAL STENT REMOVAL Right 02/24/2020   Procedure: CYSTOSCOPY WITH STENT REMOVAL;  Surgeon: Marcine Matar, MD;  Location: [facility];  Service: Urology;  Laterality: Right;  1 HR   CYSTOSCOPY WITH RETROGRADE PYELOGRAM, URETEROSCOPY AND STENT  PLACEMENT Right 02/24/2020   Procedure: CYSTOSCOPY WITH RETROGRADE PYELOGRAM, URETEROSCOPY AND STENT PLACEMENT;  Surgeon: Marcine Matar, MD;  [facility]: [facility];  Service: Urology;  Laterality: Right;   CYSTOSCOPY WITH STENT PLACEMENT Right 02/07/2020   Procedure: CYSTOSCOPY RIGHT RETROGRADE PYLEOGRAM WITH RIGHT STENT PLACEMENT;  Surgeon: Noel Christmas, MD;  [facility];  Service: Urology;  Laterality: Right;   HOLMIUM LASER APPLICATION Right 02/24/2020   Procedure: HOLMIUM LASER APPLICATION;  Surgeon: Marcine Matar, MD;  [facility];  Service: Urology;  Laterality: Right;   LEFT HEART CATHETERIZATION WITH CORONARY ANGIOGRAM N/A 10/26/2014   Procedure: LEFT HEART CATHETERIZATION WITH CORONARY ANGIOGRAM;  Surgeon: Lesleigh Noe, MD;  Location: [facility] CATH LAB;  Service: Cardiovascular;  Laterality: N/A;   LITHOTRIPSY     MASS EXCISION Left 01/09/2018   Procedure: EXCISION SUBCUTANEOUS MASS, CHEST WALL;  Surgeon: Franky Macho, MD;  Location: [facility];  Service: General;  Laterality: Left;   NASAL SINUS SURGERY     PELVIC LYMPH NODE DISSECTION Bilateral 09/11/2018   Procedure: PELVIC LYMPH NODE DISSECTION;  Surgeon: Crist Fat, MD;  [facility]: [facility];  Service: Urology;  Laterality: Bilateral;   POLYPECTOMY  02/06/2022   Procedure: POLYPECTOMY;  Surgeon: Dolores Frame, MD;  [facility];  Service: Gastroenterology;;   ROBOT ASSISTED LAPAROSCOPIC RADICAL PROSTATECTOMY N/A 09/11/2018   Procedure: XI ROBOTIC ASSISTED LAPAROSCOPIC RADICAL PROSTATECTOMY;  Surgeon: Crist Fat, MD;  [facility]: [facility];  Service: Urology;  Laterality: N/A;     Home Medications:  Prior to Admission medications   Medication Sig Start Date End Date Taking? Authorizing Provider  albuterol (VENTOLIN HFA) 108 (90 Base) MCG/ACT inhaler Inhale 1-2 puffs into the lungs every 6 (six) hours as needed for wheezing or shortness of breath.  09/30/19  Yes [provider]   allopurinol (ZYLOPRIM) 100 MG tablet Take 1 tablet by mouth daily.   Yes [provider]  Artificial Tear Solution (SOOTHE XP OP) Place 1 drop into both eyes at bedtime as needed (for dry eyes).   Yes [provider]  aspirin EC 81 MG tablet Take 162 mg by mouth as needed (for chest pain). Swallow whole.   Yes [provider]  atorvastatin (LIPITOR) 40 MG tablet Take 1 tablet by mouth daily.   Yes [provider]  colchicine 0.6 MG tablet Take 0.6 mg by mouth 2 (two) times daily as needed (gout). 10/02/21  Yes [provider]  ibuprofen (IBU) 400 MG tablet Take 400 mg by mouth every 8 (eight) hours as needed for moderate pain.   Yes [provider]  metoprolol succinate (TOPROL-XL) 25 MG 24 hr tablet Take 25 mg by mouth at bedtime.  06/28/19  Yes [provider]  Multiple Vitamin (MULTIVITAMIN WITH MINERALS) TABS tablet Take 1 tablet by mouth daily.   Yes [provider]  olmesartan (BENICAR) 40 MG tablet Take 40 mg by mouth daily. 11/29/19  Yes [provider]  Omega-3 Fatty Acids (FISH OIL) 1200 MG CAPS Take 1,200 mg by mouth daily.   Yes [provider]  pantoprazole (PROTONIX) 40 MG tablet Take 40 mg by mouth daily.   Yes [provider]  sildenafil (REVATIO) 20 MG tablet Take 60 mg by mouth daily as needed (ED).   Yes [provider]  sodium chloride (OCEAN) 0.65 % SOLN nasal spray Place 1 spray into both nostrils as needed for congestion.   Yes [provider]    Inpatient Medications: Scheduled Meds:  allopurinol  100 mg Oral Daily   aspirin EC  81 mg Oral Daily   atorvastatin  40 mg Oral Daily   enoxaparin (LOVENOX) injection  60 mg Subcutaneous QHS   irbesartan  300 mg Oral Daily   metoprolol succinate  25 mg Oral QHS   pantoprazole  40 mg Oral Daily   regadenoson  0.4 mg Intravenous Once   Continuous Infusions:  PRN Meds: acetaminophen **OR** acetaminophen,  nitroGLYCERIN, ondansetron **OR** ondansetron (ZOFRAN) IV, polyethylene glycol  Allergies:   No Known Allergies  Social History:   Social History   Socioeconomic History   Marital status: Married    Spouse name: [place]orak   Number of children: Not on file   Years of education: 12   Highest education level: 12th grade  Occupational History   Not on file  Tobacco Use   Smoking status: Former    Packs/day: 1.00    Years: 10.00    Additional pack years: 0.00    Total pack years: 10.00    Types: Cigarettes    Quit date: 09/20/1982    Years since quitting: 40.5    Passive exposure: Past   Smokeless tobacco: Never  Vaping Use   Vaping Use: Never used  Substance and Sexual Activity   Alcohol use: No   Drug use: No   Sexual activity: Yes    Partners: Female  Other Topics Concern   Not on file  Social History Narrative   Not on file   Social Determinants of Health   Financial Resource Strain: Low Risk  (06/17/2022)   Overall Financial Resource Strain (CARDIA)    Difficulty of Paying Living Expenses: Not hard at all  Food Insecurity: No Food Insecurity (04/13/2023)   Hunger Vital Sign    Worried About Running Out of Food in the Last Year: Never true    Ran Out of Food in the Last Year: Never true  Transportation Needs: No Transportation Needs (04/13/2023)   PRAPARE - Administrator, Civil Service (Medical): No    Lack of Transportation (Non-Medical): No  Physical Activity: Inactive (06/17/2022)   Exercise Vital Sign    Days of Exercise per Week: 0 days    Minutes of Exercise per Session: 0 min  Stress: No Stress Concern Present (06/17/2022)   [employer] of Occupational Health - Occupational Stress Questionnaire    Feeling of Stress : Only a little  Social Connections: Socially Integrated (06/17/2022)   Social Connection and Isolation Panel [NHANES]    Frequency of Communication with Friends and Family: More than three times a week    Frequency of  Social Gatherings with Friends and Family: More than three times a week    Attends Religious Services: More than 4 times per year    Active Member of [employer] or Organizations: Yes    Attends Engineer, structural: More than 4 times per year    Marital Status: Married  Catering manager Violence: Not At Risk (04/13/2023)   Humiliation, Afraid, Rape, and Kick questionnaire    Fear of Current or Ex-Partner: No    Emotionally Abused: No    Physically Abused: No    Sexually Abused: No    Family History:     Family History  Problem Relation Age of Onset   CAD Mother    CAD Father    Prostate cancer Father      ROS:  Please see the history of present illness.  Review of Systems  Constitutional: Negative.  HENT: Negative.    Cardiovascular:  Positive for chest pain and near-syncope.  Respiratory: Negative.    Endocrine: Negative.   Hematologic/Lymphatic: Negative.   Musculoskeletal: Negative.   Gastrointestinal: Negative.   Genitourinary: Negative.   Neurological: Negative.     All other ROS reviewed and negative.     Physical Exam/Data:   Vitals:   04/13/23 2341 04/14/23 0046 04/14/23 0411 04/14/23 0811  BP: (!) 128/90  128/82 (!) 147/94  Pulse: (!) 56 (!) 55 (!) 54 (!) 53  Resp: 18 16 18 18   Temp: 97.8 F (36.6 C)   97.6 F (36.4 C)  TempSrc:      SpO2: 95% 96% 97% 99%  Weight:      Height:       No intake or output data in the 24 hours ending 04/14/23 0920    04/13/2023    7:41 PM 04/13/2023   12:54 PM 02/06/2022    8:55 AM  Last 3 Weights  Weight (lbs) 260 lb 12.9 oz 260 lb 250 lb  Weight (kg) 118.3 kg 117.935 kg 113.399 kg     Body mass index is 37.42 kg/m.  General:  Obese, in no acute distress  HEENT: normal Neck: no JVD Vascular: No carotid bruits; Distal pulses 2+ bilaterally Cardiac:  normal S1, S2; RRR; no murmur   Lungs:  clear to auscultation bilaterally,  no wheezing, rhonchi or rales  Abd: soft, nontender, no hepatomegaly  Ext: no  edema Musculoskeletal:  No deformities, BUE and BLE strength normal and equal Skin: warm and dry  Neuro:  CNs 2-12 intact, no focal abnormalities noted Psych:  Normal affect   EKG:  The EKG was personally reviewed and demonstrates:  NSR normal EKG Telemetry:  Telemetry was personally reviewed and demonstrates:  sinus bradycardia 50's  Relevant CV Studies:  NST 2020 Blood pressure demonstrated a normal response to exercise. There was no ST segment deviation noted during stress. The study is normal. This is a low risk study. The left ventricular ejection fraction is normal (55-65%).   Normal resting and stress perfusion. No ischemia or infarction EF 63%   Echo 2020 IMPRESSIONS     1. Left ventricular ejection fraction, by visual estimation, is 60 to  65%. The left ventricle has normal function. Normal left ventricular size.  There is no left ventricular hypertrophy.   2. Global right ventricle has normal systolic function.The right  ventricular size is normal. No increase in right ventricular wall  thickness.   3. Left atrial size was normal.   4. Right atrial size was normal.   5. The mitral valve is normal in structure. No evidence of mitral valve  regurgitation. No evidence of mitral stenosis.   6. The tricuspid valve is normal in structure. Tricuspid valve  regurgitation was not visualized by color flow Doppler.   7. The aortic valve is normal in structure. Aortic valve regurgitation  was not visualized by color flow Doppler. Structurally normal aortic  valve, with no evidence of sclerosis or stenosis.   8. The pulmonic valve was normal in structure. Pulmonic valve  regurgitation is not visualized by color flow Doppler.   9. The inferior vena cava is normal in size with greater than 50%  respiratory variability, suggesting right atrial pressure of 3 mmHg.   Laboratory Data:  High Sensitivity Troponin:   Recent Labs  Lab 04/13/23 1334 04/13/23 1534  TROPONINIHS <2  2     Chemistry Recent Labs  Lab 04/13/23 1334  NA 138  K 3.8  CL 107  CO2 22  GLUCOSE 101*  BUN 17  CREATININE 1.22  CALCIUM 9.3  GFRNONAA >60  ANIONGAP 9    No results for input(s): "PROT", "ALBUMIN", "AST", "ALT", "ALKPHOS", "BILITOT" in the last 168 hours. Lipids  Recent Labs  Lab 04/14/23 0818  CHOL 170  TRIG 209*  HDL 33*  LDLCALC 95  CHOLHDL 5.2    Hematology Recent Labs  Lab 04/13/23 1334  WBC 4.9  RBC 5.82*  HGB 17.2*  HCT 50.8  MCV 87.3  MCH 29.6  MCHC 33.9  RDW 12.9  PLT 186   Thyroid No results for input(s): "TSH", "FREET4" in the last 168 hours.  BNPNo results for input(s): "BNP", "PROBNP" in the last 168 hours.  DDimer  Recent Labs  Lab 04/13/23 1807  DDIMER 0.42     Radiology/Studies:  CT Head Wo Contrast  Result Date: 04/13/2023 CLINICAL DATA:  Acute neurologic deficit.  Suspected stroke. EXAM: CT HEAD WITHOUT CONTRAST TECHNIQUE: Contiguous axial images were obtained from the base of the skull through the vertex without intravenous contrast. RADIATION DOSE REDUCTION: This exam was performed according to the departmental dose-optimization program which includes automated exposure control, adjustment of the mA and/or kV according to patient size and/or use of iterative reconstruction technique. COMPARISON:  11/13/2014 FINDINGS: Brain: No evidence of intracranial hemorrhage, acute infarction,  hydrocephalus, extra-axial collection, or mass lesion/mass effect. Vascular:  No hyperdense vessel or other acute findings. Skull: No evidence of fracture or other significant bone abnormality. Sinuses/Orbits: No acute findings. Diffuse mucosal thickening is seen throughout the paranasal sinuses bilaterally. No air-fluid levels. Other: None. IMPRESSION: No acute intracranial abnormality. Chronic pansinusitis. Electronically Signed   By: Danae Orleans M.D.   On: 04/13/2023 14:57   DG Chest 2 View  Result Date: 04/13/2023 CLINICAL DATA:  Chest pain EXAM: CHEST  - 2 VIEW COMPARISON:  Previous chest radiographs done on 08/31/2016 and CT done on 11/21/2017 FINDINGS: Transverse diameter of heart is increased. There is soft tissue fullness in retrocardiac region suggesting moderate sized fixed hiatal hernia. There are no signs of pulmonary edema or focal pulmonary consolidation. There is no pleural effusion or pneumothorax. IMPRESSION: Cardiomegaly. There are no signs of pulmonary edema or focal pulmonary consolidation. Fixed hiatal hernia. Electronically Signed   By: Ernie Avena M.D.   On: 04/13/2023 14:19     Assessment and Plan:   Chest pain burning off/on for several days unrelated to left arm pain,  troponins negative. Currently NPO. Not currently having pain. Eased spontaneously.  Will try to arrange lexiscan myoview  Left arm pain constant for several weeks. For MRI today.  CAD 30% LAD on cath 2015, normal NST 2020, normal LVEF on echo 2020  HTN BP controlled at home but running higher here-on irbesartan 300 mg daily and metoprolol xl 25 mg daily.  HLD on lipitor LDL 95, trig 209  OSA compliant with cpap  Obesity-no regular exercise, needs weight loss after discharge     Risk Assessment/Risk Scores:     TIMI Risk Score for Unstable Angina or Non-ST Elevation MI:   The patient's TIMI risk score is 3, which indicates a 13% risk of all cause mortality, new or recurrent myocardial infarction or need for urgent revascularization in the next 14 days.          For questions or updates, please contact [facility] Please consult [email or web address] for contact info under    Signed, Jacolyn Reedy, PA-C  04/14/2023 9:20 AM   Patient seen and examined   I agree with findings as noted by Leda Gauze above  PT is a 62 yo with mild CAD by cath in 2015   Presents with chest burning and L arm pain   Has ruled out for MI  ON exam, pt an obese 62 yo in NAD Neck   JVP is normal Chest   Nontender Lungs are CTA Cardiac exam   RRR  No S3  No  murmurs  Abd is benign     Ext are without edema  SOme discomfort with moving L arm  Impression:  CP   SOmewhat atypical for cardiac   Given risk factors will set up for Myoview to evaluate for ischemia   Needs tighter control of lipids  LDL 95  Would add Zetia to regimen  Goal less than 70  Will continue to follow   Dietrich Pates MD

## 2023-04-14 NOTE — Progress Notes (Signed)
   Lexiscan myoview  No ischemia  Normal perfusion  Echo:  Normal LVEF/RVEF  No evid for significant coronary artery ischemia  Will sign off     Signed, Dietrich Pates, MD  04/14/2023, 3:25 PM

## 2023-04-17 ENCOUNTER — Encounter (HOSPITAL_COMMUNITY): Payer: Self-pay | Admitting: Family Medicine

## 2023-04-17 ENCOUNTER — Other Ambulatory Visit (HOSPITAL_COMMUNITY): Payer: Self-pay | Admitting: Family Medicine

## 2023-04-17 DIAGNOSIS — M79602 Pain in left arm: Secondary | ICD-10-CM

## 2023-04-17 DIAGNOSIS — N393 Stress incontinence (female) (male): Secondary | ICD-10-CM | POA: Diagnosis not present

## 2023-04-17 DIAGNOSIS — N5231 Erectile dysfunction following radical prostatectomy: Secondary | ICD-10-CM | POA: Diagnosis not present

## 2023-04-24 ENCOUNTER — Ambulatory Visit (HOSPITAL_COMMUNITY)
Admission: RE | Admit: 2023-04-24 | Discharge: 2023-04-24 | Disposition: A | Discharge status: Home / Self Care | Payer: BC Managed Care – PPO | Source: Ambulatory Visit | Attending: Family Medicine | Admitting: Family Medicine

## 2023-04-24 DIAGNOSIS — M5031 Other cervical disc degeneration,  high cervical region: Secondary | ICD-10-CM | POA: Diagnosis not present

## 2023-04-24 DIAGNOSIS — M50221 Other cervical disc displacement at C4-C5 level: Secondary | ICD-10-CM | POA: Diagnosis not present

## 2023-04-24 DIAGNOSIS — M4802 Spinal stenosis, cervical region: Secondary | ICD-10-CM | POA: Diagnosis not present

## 2023-04-24 DIAGNOSIS — M79602 Pain in left arm: Secondary | ICD-10-CM | POA: Diagnosis not present

## 2023-05-04 DIAGNOSIS — M51369 Other intervertebral disc degeneration, lumbar region without mention of lumbar back pain or lower extremity pain: Secondary | ICD-10-CM | POA: Insufficient documentation

## 2023-05-04 DIAGNOSIS — M5136 Other intervertebral disc degeneration, lumbar region: Secondary | ICD-10-CM | POA: Insufficient documentation

## 2023-05-05 DIAGNOSIS — I10 Essential (primary) hypertension: Secondary | ICD-10-CM | POA: Diagnosis not present

## 2023-05-05 DIAGNOSIS — M50322 Other cervical disc degeneration at C5-C6 level: Secondary | ICD-10-CM | POA: Diagnosis not present

## 2023-05-05 DIAGNOSIS — M519 Unspecified thoracic, thoracolumbar and lumbosacral intervertebral disc disorder: Secondary | ICD-10-CM | POA: Diagnosis not present

## 2023-05-05 DIAGNOSIS — M5031 Other cervical disc degeneration,  high cervical region: Secondary | ICD-10-CM | POA: Diagnosis not present

## 2023-05-05 DIAGNOSIS — R0789 Other chest pain: Secondary | ICD-10-CM | POA: Diagnosis not present

## 2023-05-13 DIAGNOSIS — G4733 Obstructive sleep apnea (adult) (pediatric): Secondary | ICD-10-CM | POA: Diagnosis not present

## 2023-05-19 DIAGNOSIS — M519 Unspecified thoracic, thoracolumbar and lumbosacral intervertebral disc disorder: Secondary | ICD-10-CM | POA: Diagnosis not present

## 2023-05-19 DIAGNOSIS — M5031 Other cervical disc degeneration,  high cervical region: Secondary | ICD-10-CM | POA: Diagnosis not present

## 2023-05-19 DIAGNOSIS — I10 Essential (primary) hypertension: Secondary | ICD-10-CM | POA: Diagnosis not present

## 2023-05-19 DIAGNOSIS — Z87891 Personal history of nicotine dependence: Secondary | ICD-10-CM | POA: Diagnosis not present

## 2023-06-09 DIAGNOSIS — Z8546 Personal history of malignant neoplasm of prostate: Secondary | ICD-10-CM | POA: Diagnosis not present

## 2023-06-09 DIAGNOSIS — M109 Gout, unspecified: Secondary | ICD-10-CM | POA: Diagnosis not present

## 2023-06-09 DIAGNOSIS — I10 Essential (primary) hypertension: Secondary | ICD-10-CM | POA: Diagnosis not present

## 2023-06-11 DIAGNOSIS — M4722 Other spondylosis with radiculopathy, cervical region: Secondary | ICD-10-CM | POA: Insufficient documentation

## 2023-06-13 DIAGNOSIS — G4733 Obstructive sleep apnea (adult) (pediatric): Secondary | ICD-10-CM | POA: Diagnosis not present

## 2023-06-17 DIAGNOSIS — M4722 Other spondylosis with radiculopathy, cervical region: Secondary | ICD-10-CM | POA: Diagnosis not present

## 2023-06-19 DIAGNOSIS — M4722 Other spondylosis with radiculopathy, cervical region: Secondary | ICD-10-CM | POA: Diagnosis not present

## 2023-06-24 DIAGNOSIS — M4722 Other spondylosis with radiculopathy, cervical region: Secondary | ICD-10-CM | POA: Diagnosis not present

## 2023-06-26 ENCOUNTER — Ambulatory Visit (HOSPITAL_COMMUNITY): Payer: BC Managed Care – PPO | Admitting: Physical Therapy

## 2023-06-26 DIAGNOSIS — M4722 Other spondylosis with radiculopathy, cervical region: Secondary | ICD-10-CM | POA: Diagnosis not present

## 2023-07-01 DIAGNOSIS — R0789 Other chest pain: Secondary | ICD-10-CM | POA: Diagnosis not present

## 2023-07-01 DIAGNOSIS — I10 Essential (primary) hypertension: Secondary | ICD-10-CM | POA: Diagnosis not present

## 2023-07-01 DIAGNOSIS — M50322 Other cervical disc degeneration at C5-C6 level: Secondary | ICD-10-CM | POA: Diagnosis not present

## 2023-07-01 DIAGNOSIS — M5137 Other intervertebral disc degeneration, lumbosacral region: Secondary | ICD-10-CM | POA: Diagnosis not present

## 2023-07-01 DIAGNOSIS — M519 Unspecified thoracic, thoracolumbar and lumbosacral intervertebral disc disorder: Secondary | ICD-10-CM | POA: Diagnosis not present

## 2023-07-01 DIAGNOSIS — M4722 Other spondylosis with radiculopathy, cervical region: Secondary | ICD-10-CM | POA: Diagnosis not present

## 2023-07-03 DIAGNOSIS — M4722 Other spondylosis with radiculopathy, cervical region: Secondary | ICD-10-CM | POA: Diagnosis not present

## 2023-07-08 DIAGNOSIS — M4722 Other spondylosis with radiculopathy, cervical region: Secondary | ICD-10-CM | POA: Diagnosis not present

## 2023-07-10 DIAGNOSIS — M4722 Other spondylosis with radiculopathy, cervical region: Secondary | ICD-10-CM | POA: Diagnosis not present

## 2023-07-22 ENCOUNTER — Encounter: Payer: Self-pay | Admitting: Orthopedic Surgery

## 2023-07-22 ENCOUNTER — Ambulatory Visit (INDEPENDENT_AMBULATORY_CARE_PROVIDER_SITE_OTHER): Payer: BC Managed Care – PPO | Admitting: Orthopedic Surgery

## 2023-07-22 VITALS — BP 160/101 | HR 62 | Ht 71.0 in | Wt 250.0 lb

## 2023-07-22 DIAGNOSIS — M4722 Other spondylosis with radiculopathy, cervical region: Secondary | ICD-10-CM

## 2023-07-22 NOTE — Progress Notes (Unsigned)
New Patient Visit  Assessment: Anthony Anderson is a 62 y.o. male with the following: 1. Radiculopathy due to cervical spondylosis at multiple levels  Plan: Anthony Anderson has pain in the left arm, that has been ongoing for the past 2-3 months.  He has an MRI demonstrating degenerative changes at multiple levels.  Since the onset of his symptoms, his pain has significantly improved.  He has taken prednisone, as well as NSAIDs.  No numbness or tingling.  Physical therapy has made a difference, but he states his improvements have plateaued.  He has excellent motion in the left shoulder, as well as excellent strength.  At this point, given his presentation, and the presence of pathology on the cervical spine MRI, I would like for him to see Dr. Christell Constant for further discussion regarding treatment.  He is in agreement with this plan.  Will place the referral.  In addition, we will place a referral for him to continue physical therapy, as this is no longer available for him at his work.  Follow-up as needed.  Follow-up: Return for referral to Dr. Christell Constant.  Subjective:  Chief Complaint  Patient presents with   Neck Pain    Down left arm since vacation in June has gone for physical therapy (helped) has had MRI done at [facility] took steroid taper helped only one day     History of Present Illness: Anthony Anderson is a 62 y.o. male who has been referred by Lupita Raider, NP for evaluation of left arm pain.  He states he started to have pain in the left arm, starting in the upper shoulder neck area, radiating into the left hand a few months ago.  The pain initially was relentless.  No numbness or tingling.  He has tried a prednisone Dosepak, which improved some of his symptoms.  He has also been working with physical therapy at work.  This has also helped.  His pain is much better than it was when it initially started, but he continues to have issues.  His improvements have plateaued while working with therapy.   He has had an MRI of the cervical spine, but has not seen a spine specialist.   Review of Systems: No fevers or chills No numbness or tingling No chest pain No shortness of breath No bowel or bladder dysfunction No GI distress No headaches   Medical History:  Past Medical History:  Diagnosis Date   Cancer (HCC)    prostate, currently watching for any changes   Cholelithiasis    Dyspnea 2015   Dysrhythmia    GERD (gastroesophageal reflux disease)    History of hiatal hernia    History of kidney stones    Hyperlipidemia    Hypertension    Kidney stones    PSA elevation    Sleep apnea    uses CPAP    Past Surgical History:  Procedure Laterality Date   CARDIAC CATHETERIZATION     COLONOSCOPY WITH PROPOFOL N/A 02/06/2022   Procedure: COLONOSCOPY WITH PROPOFOL;  Surgeon: Dolores Frame, MD;  [facility];  Service: Gastroenterology;  Laterality: N/A;  10:00   CYSTOSCOPY W/ URETERAL STENT REMOVAL Right 02/24/2020   Procedure: CYSTOSCOPY WITH STENT REMOVAL;  Surgeon: Marcine Matar, MD;  [facility];  Service: Urology;  Laterality: Right;  1 HR   CYSTOSCOPY WITH RETROGRADE PYELOGRAM, URETEROSCOPY AND STENT PLACEMENT Right 02/24/2020   Procedure: CYSTOSCOPY WITH RETROGRADE PYELOGRAM, URETEROSCOPY AND STENT PLACEMENT;  Surgeon: Retta Diones,  Jeannett Senior, MD;  [facility];  Service: Urology;  Laterality: Right;   CYSTOSCOPY WITH STENT PLACEMENT Right 02/07/2020   Procedure: CYSTOSCOPY RIGHT RETROGRADE PYLEOGRAM WITH RIGHT STENT PLACEMENT;  Surgeon: Noel Christmas, MD;  [facility];  Service: Urology;  Laterality: Right;   HOLMIUM LASER APPLICATION Right 02/24/2020   Procedure: HOLMIUM LASER APPLICATION;  Surgeon: Marcine Matar, MD;  [facility];  Service: Urology;  Laterality: Right;   LEFT HEART CATHETERIZATION WITH CORONARY ANGIOGRAM N/A 10/26/2014   Procedure: LEFT HEART CATHETERIZATION WITH CORONARY ANGIOGRAM;  Surgeon: Lesleigh Noe, MD;  Location: [facility] CATH LAB;  Service: Cardiovascular;  Laterality: N/A;   LITHOTRIPSY     MASS EXCISION Left 01/09/2018   Procedure: EXCISION SUBCUTANEOUS MASS, CHEST WALL;  Surgeon: Franky Macho, MD;  Location: [facility];  Service: General;  Laterality: Left;   NASAL SINUS SURGERY     PELVIC LYMPH NODE DISSECTION Bilateral 09/11/2018   Procedure: PELVIC LYMPH NODE DISSECTION;  Surgeon: Crist Fat, MD;  [facility];  Service: Urology;  Laterality: Bilateral;   POLYPECTOMY  02/06/2022   Procedure: POLYPECTOMY;  Surgeon: Dolores Frame, MD;  [facility];  Service: Gastroenterology;;   ROBOT ASSISTED LAPAROSCOPIC RADICAL PROSTATECTOMY N/A 09/11/2018   Procedure: XI ROBOTIC ASSISTED LAPAROSCOPIC RADICAL PROSTATECTOMY;  Surgeon: Crist Fat, MD;  [facility];  Service: Urology;  Laterality: N/A;    Family History  Problem Relation Age of Onset   CAD Mother    CAD Father    Prostate cancer Father    Social History   Tobacco Use   Smoking status: Former    Current packs/day: 0.00    Average packs/day: 1 pack/day for 10.0 years (10.0 ttl pk-yrs)    Types: Cigarettes    Start date: 09/20/1972    Quit date: 09/20/1982    Years since quitting: 40.8    Passive exposure: Past   Smokeless tobacco: Never  Vaping Use   Vaping status: Never Used  Substance Use Topics   Alcohol use: No   Drug use: No    No Known Allergies  Current Meds  Medication Sig   albuterol (VENTOLIN HFA) 108 (90 Base) MCG/ACT inhaler Inhale 1-2 puffs into the lungs every 6 (six) hours as needed for wheezing or shortness of breath.    allopurinol (ZYLOPRIM) 100 MG tablet Take 1 tablet by mouth daily.   amLODipine (NORVASC) 10 MG tablet Take 1 tablet every day by oral route for 90 days.   Artificial Tear Solution (SOOTHE XP OP) Place 1 drop into both eyes at bedtime as needed (for dry eyes).   aspirin EC 81 MG tablet Take 162 mg by mouth as needed (for chest pain).  Swallow whole.   atorvastatin (LIPITOR) 40 MG tablet Take 1 tablet by mouth daily.   colchicine 0.6 MG tablet Take 0.6 mg by mouth 2 (two) times daily as needed (gout).   ibuprofen (IBU) 400 MG tablet Take 400 mg by mouth every 8 (eight) hours as needed for moderate pain.   metoprolol succinate (TOPROL-XL) 25 MG 24 hr tablet Take 25 mg by mouth at bedtime.    Multiple Vitamin (MULTIVITAMIN WITH MINERALS) TABS tablet Take 1 tablet by mouth daily.   olmesartan (BENICAR) 40 MG tablet Take 40 mg by mouth daily.   Omega-3 Fatty Acids (FISH OIL) 1200 MG CAPS Take 1,200 mg by mouth daily.   pantoprazole (PROTONIX) 40 MG tablet Take 40 mg by mouth daily.  sildenafil (REVATIO) 20 MG tablet Take 60 mg by mouth daily as needed (ED).   sodium chloride (OCEAN) 0.65 % SOLN nasal spray Place 1 spray into both nostrils as needed for congestion.   VASCEPA 1 g capsule Take 2 capsules twice a day by oral route for 90 days.    Objective: BP (!) 160/101 Comment: high since June Dr Margo Aye working on it  Pulse 62   Ht 5\' 11"  (1.803 m)   Wt 250 lb (113.4 kg)   BMI 34.87 kg/m   Physical Exam:  General: Alert and oriented. and No acute distress. Gait: Normal gait.  Evaluation of left shoulder demonstrates no deformity.  Well-developed.  No point tenderness.  He has full range of motion of the left shoulder, with excellent strength.  Sensation is intact throughout the left upper extremity.  He does demonstrate restricted range of motion of the cervical spine, with both rotation, as well as extension.  This does not recreate any radicular symptoms.  IMAGING: I personally reviewed images previously obtained in clinic  Cervical spine MRI was previously obtained.  IMPRESSION: 1. Moderate degenerative disc disease at C3-4 with spinal and foraminal stenosis as detailed above. 2. Right-sided disc osteophyte complex at C5-6 with significant right foraminal stenosis likely affecting the right C6 nerve  root.   New Medications:  No orders of the defined types were placed in this encounter.     Oliver Barre, MD  07/22/2023 2:58 PM

## 2023-07-29 ENCOUNTER — Ambulatory Visit (HOSPITAL_COMMUNITY): Payer: BC Managed Care – PPO | Attending: Orthopedic Surgery

## 2023-07-29 DIAGNOSIS — M5412 Radiculopathy, cervical region: Secondary | ICD-10-CM | POA: Insufficient documentation

## 2023-07-29 DIAGNOSIS — R293 Abnormal posture: Secondary | ICD-10-CM | POA: Insufficient documentation

## 2023-07-29 DIAGNOSIS — M4722 Other spondylosis with radiculopathy, cervical region: Secondary | ICD-10-CM | POA: Insufficient documentation

## 2023-07-29 DIAGNOSIS — M25512 Pain in left shoulder: Secondary | ICD-10-CM | POA: Insufficient documentation

## 2023-07-29 NOTE — Therapy (Signed)
[facility] OUTPATIENT PHYSICAL THERAPY UPPER EXTREMITY EVALUATION   Patient Name: Anthony Anderson MRN: [id number] DOB:1961-03-17, 62 y.o., male Today's Date: 07/29/2023  END OF SESSION:   PT End of Session - 07/29/23 1522     Visit Number 1    Authorization Type [employer] PPO    Authorization Time Period no auth no limit    Progress Note Due on Visit 10    PT Start Time 1515    PT Stop Time 1555    PT Time Calculation (min) 40 min             Past Medical History:  Diagnosis Date   Cancer (HCC)    prostate, currently watching for any changes   Cholelithiasis    Dyspnea 2015   Dysrhythmia    GERD (gastroesophageal reflux disease)    History of hiatal hernia    History of kidney stones    Hyperlipidemia    Hypertension    Kidney stones    PSA elevation    Sleep apnea    uses CPAP   Past Surgical History:  Procedure Laterality Date   CARDIAC CATHETERIZATION     COLONOSCOPY WITH PROPOFOL N/A 02/06/2022   Procedure: COLONOSCOPY WITH PROPOFOL;  Surgeon: Dolores Frame, MD;  [facility];  Service: Gastroenterology;  Laterality: N/A;  10:00   CYSTOSCOPY W/ URETERAL STENT REMOVAL Right 02/24/2020   Procedure: CYSTOSCOPY WITH STENT REMOVAL;  Surgeon: Marcine Matar, MD;  Location: [facility];  Service: Urology;  Laterality: Right;  1 HR   CYSTOSCOPY WITH RETROGRADE PYELOGRAM, URETEROSCOPY AND STENT PLACEMENT Right 02/24/2020   Procedure: CYSTOSCOPY WITH RETROGRADE PYELOGRAM, URETEROSCOPY AND STENT PLACEMENT;  Surgeon: Marcine Matar, MD;  Location: [facility];  Service: Urology;  Laterality: Right;   CYSTOSCOPY WITH STENT PLACEMENT Right 02/07/2020   Procedure: CYSTOSCOPY RIGHT RETROGRADE PYLEOGRAM WITH RIGHT STENT PLACEMENT;  Surgeon: Noel Christmas, MD;  Location: [facility];  Service: Urology;  Laterality: Right;   HOLMIUM LASER APPLICATION Right 02/24/2020   Procedure: HOLMIUM LASER APPLICATION;  Surgeon: Marcine Matar, MD;  Location: [facility];  Service:  Urology;  Laterality: Right;   LEFT HEART CATHETERIZATION WITH CORONARY ANGIOGRAM N/A 10/26/2014   Procedure: LEFT HEART CATHETERIZATION WITH CORONARY ANGIOGRAM;  Surgeon: Lesleigh Noe, MD;  Location: [facility] CATH LAB;  Service: Cardiovascular;  Laterality: N/A;   LITHOTRIPSY     MASS EXCISION Left 01/09/2018   Procedure: EXCISION SUBCUTANEOUS MASS, CHEST WALL;  Surgeon: Franky Macho, MD;  Location: [facility];  Service: General;  Laterality: Left;   NASAL SINUS SURGERY     PELVIC LYMPH NODE DISSECTION Bilateral 09/11/2018   Procedure: PELVIC LYMPH NODE DISSECTION;  Surgeon: Crist Fat, MD;  Location: [facility];  Service: Urology;  Laterality: Bilateral;   POLYPECTOMY  02/06/2022   Procedure: POLYPECTOMY;  Surgeon: Dolores Frame, MD;  [facility];  Service: Gastroenterology;;   ROBOT ASSISTED LAPAROSCOPIC RADICAL PROSTATECTOMY N/A 09/11/2018   Procedure: XI ROBOTIC ASSISTED LAPAROSCOPIC RADICAL PROSTATECTOMY;  Surgeon: Crist Fat, MD;  Location: [facility];  Service: Urology;  Laterality: N/A;   Patient Active Problem List   Diagnosis Date Noted   Radiculopathy due to cervical spondylosis at multiple levels 06/11/2023   Degeneration of lumbar intervertebral disc 05/04/2023   Class 2 obesity 04/14/2023   Chest pressure 04/14/2023   Acute sinusitis 10/29/2022   Chronic kidney disease, stage 4 (severe) (HCC) 06/28/2021   Ureteral calculus 02/07/2020   Prostate cancer (HCC) 09/11/2018  Lipoma of torso    Essential hypertension 10/26/2014   Hyperlipidemia 10/26/2014   GERD (gastroesophageal reflux disease) 10/26/2014   Chest pain    Shortness of breath 10/24/2014   Near syncope 10/24/2014   Dyspnea 10/24/2014    PCP: Benita Stabile, MDRef Provider (PCP)   REFERRING PROVIDER: Oliver Barre, MD   REFERRING DIAG: [phone] (ICD-10-CM) - Radiculopathy due to cervical spondylosis at multiple levels   THERAPY DIAG:  Radiculopathy, cervical region  Left  shoulder pain, unspecified chronicity  Abnormal posture  Rationale for Evaluation and Treatment: Rehabilitation  ONSET DATE: June 2024  SUBJECTIVE:                                                                                                                                                                                      SUBJECTIVE STATEMENT: Patient was on vacation and felt left pain down the arm to the fingers. Patient was cleared for any stroke or TIA and MRI 03/2023 and cleared for heart attack 04/2023. Patient was sent to MD 05/2023 - 06/2023. Patient saw Dr Dallas Schimke 07/22/23 who referred to OPPT Hand dominance: Right  PERTINENT HISTORY: HTN   PAIN:  Are you having pain? Yes: NPRS scale: 1/10 Pain location: left upper arm Pain description: sore Aggravating factors: laying down/reclining  Relieving factors: upright   PRECAUTIONS: None  RED FLAGS: None   WEIGHT BEARING RESTRICTIONS: No  FALLS:  Has patient fallen in last 6 months? No  OCCUPATION: Maintenance supervisor- light duty   PLOF: Independent  PATIENT GOALS: To reduce pain to 0%   NEXT MD VISIT: 08/25/23  OBJECTIVE:   DIAGNOSTIC FINDINGS:  IMPRESSION: 1. Moderate degenerative disc disease at C3-4 with spinal and foraminal stenosis as detailed above. 2. Right-sided disc osteophyte complex at C5-6 with significant right foraminal stenosis likely affecting the right C6 nerve root.   COGNITION: Overall cognitive status: Within functional limits for tasks assessed     SENSATION: WFL  POSTURE: Decreased cervical/ lumbar lordosis   CERVICAL ROM:   Active ROM A/PROM (deg) eval  Flexion   Extension WFL  Right lateral flexion 50%  Left lateral flexion 50%  Right rotation 25%  Left rotation 50%   (Blank rows = not tested)  UPPER EXTREMITY ROM:   Active ROM Right eval Left eval  Shoulder flexion [facility]  Shoulder extension    Shoulder abduction [facility]  Shoulder adduction     Shoulder internal rotation [facility]  Shoulder external rotation [facility]  Elbow flexion    Elbow extension    Wrist flexion    Wrist extension    Wrist ulnar deviation  Wrist radial deviation    Wrist pronation    Wrist supination    (Blank rows = not tested; * = limited by pain )    UPPER EXTREMITY MMT:   MMT Right eval Left eval  Shoulder flexion 4 3+ pain  Shoulder extension    Shoulder abduction 3 3+ pain  Shoulder adduction    Shoulder internal rotation    Shoulder external rotation    Middle trapezius    Lower trapezius    Elbow flexion    Elbow extension    Wrist flexion    Wrist extension    Wrist ulnar deviation    Wrist radial deviation    Wrist pronation    Wrist supination    (Blank rows = not tested; * = limited by pain )  SHOULDER SPECIAL TESTS: Impingement tests: Neer impingement test: positive on left LE SLAP lesions:  NT Instability tests:  NT  PALPATION:  Moderate tenderness to palpation left cervical, left RTC muscles   TODAY'S TREATMENT:                                                                                                                                         DATE:   07/29/23 PT Low Eval  PATIENT EDUCATION: Education details: Posture, pain management  Person educated: Patient Education method: Explanation Education comprehension: verbalized understanding  HOME EXERCISE PROGRAM: TBD   ASSESSMENT:   CLINICAL IMPRESSION: Patient is a 62 y.o. male who was seen today for physical therapy evaluation and treatment for cervical/left shoulder pain/ abnormal posture. Patient presents to PT with the following objective impairments: decreased mobility, decreased strength, increased muscle spasms, impaired UE functional use, postural dysfunction, and pain. These impairments limit the patient in activities such as carrying, lifting, bending, sitting, and sleeping. These impairments also limit the patient in participation such as  cleaning, laundry, community activity, and yard work. The patient will benefit from PT to address the limitations/impairments listed below to return to their prior level of function in the domains of activity and participation.   PERSONAL FACTORS:  n/a  are also affecting patient's functional outcome.   REHAB POTENTIAL: Excellent  CLINICAL DECISION MAKING: Stable/uncomplicated  EVALUATION COMPLEXITY: Low  GOALS: Goals reviewed with patient? No  SHORT TERM GOALS: Target date: 08/19/2023     Patient will be independent with a basic stretching/strengthening HEP  Baseline: Goal status: INITIAL   2.  Patient will be able to lift an object of 10lbs from a hip height off of a stable surface with 1-2/10 pain to facilitate ADL completion and self-care  Baseline:  Goal status: INITIAL   LONG TERM GOALS: Target date: 09/09/2023   Patient will be independent with a comprehensive strengthening HEP  Baseline:  Goal status: INITIAL  2.   Patient will improve  pain scale  score to 0/10 to facilitate pain-free ADL completion, lifting/carrying objects, reaching, and  self-care  Baseline:  Goal status: INITIAL  3.  Patient will be able to lift an object of 10lbs overhead with 0/10 pain to facilitate ADL completion and self-care  Baseline:  Goal status: INITIAL    PLAN: PT FREQUENCY: 1-2x/week  PT DURATION: 6 weeks  PLANNED INTERVENTIONS: Therapeutic exercises, Therapeutic activity, Neuromuscular re-education, Balance training, Gait training, Patient/Family education, Self Care, Joint mobilization, Joint manipulation, Dry Needling, Spinal manipulation, Spinal mobilization, Cryotherapy, Moist heat, and Manual therapy  PLAN FOR NEXT SESSION: Initiate cervical / shoulder HEP    Seymour Bars, PT 07/29/2023, 3:25 PM

## 2023-08-07 ENCOUNTER — Ambulatory Visit (HOSPITAL_COMMUNITY): Payer: BC Managed Care – PPO

## 2023-08-07 DIAGNOSIS — M4722 Other spondylosis with radiculopathy, cervical region: Secondary | ICD-10-CM | POA: Diagnosis not present

## 2023-08-07 DIAGNOSIS — M25512 Pain in left shoulder: Secondary | ICD-10-CM | POA: Diagnosis not present

## 2023-08-07 DIAGNOSIS — R293 Abnormal posture: Secondary | ICD-10-CM

## 2023-08-07 DIAGNOSIS — M5412 Radiculopathy, cervical region: Secondary | ICD-10-CM | POA: Diagnosis not present

## 2023-08-07 NOTE — Therapy (Signed)
[facility] Anderson [facility] OUTPATIENT PHYSICAL THERAPY TREATMENT   Patient Name: Anthony Anderson MRN: [id number] DOB:08/22/1961, 62 y.o., male Today's Date: 08/07/2023  END OF SESSION:   PT End of Session - 08/07/23 1520     Visit Number 2    Number of Visits 12    Date for PT Re-Evaluation 09/09/23    Authorization Type [employer] PPO-no auth no limit    Authorization Time Period 07/29/23-09/09/23    Progress Note Due on Visit 10    PT Start Time 1515    PT Stop Time 1555    PT Time Calculation (min) 40 min    Activity Tolerance Patient tolerated treatment well;No increased pain;Patient limited by pain    Behavior During Therapy [facility] for tasks assessed/performed             Past Medical History:  Diagnosis Date   Cancer [facility])    prostate, currently watching for any changes   Cholelithiasis    Dyspnea 2015   Dysrhythmia    GERD (gastroesophageal reflux disease)    History of hiatal hernia    History of kidney stones    Hyperlipidemia    Hypertension    Kidney stones    PSA elevation    Sleep apnea    uses CPAP   Past Surgical History:  Procedure Laterality Date   CARDIAC CATHETERIZATION     COLONOSCOPY WITH PROPOFOL N/A 02/06/2022   Procedure: COLONOSCOPY WITH PROPOFOL;  Surgeon: Dolores Frame, MD;  [facility];  Service: Gastroenterology;  Laterality: N/A;  10:00   CYSTOSCOPY W/ URETERAL STENT REMOVAL Right 02/24/2020   Procedure: CYSTOSCOPY WITH STENT REMOVAL;  Surgeon: Marcine Matar, MD;  [facility]: [facility];  Service: Urology;  Laterality: Right;  1 HR   CYSTOSCOPY WITH RETROGRADE PYELOGRAM, URETEROSCOPY AND STENT PLACEMENT Right 02/24/2020   Procedure: CYSTOSCOPY WITH RETROGRADE PYELOGRAM, URETEROSCOPY AND STENT PLACEMENT;  Surgeon: Marcine Matar, MD;  [facility];  Service: Urology;  Laterality: Right;   CYSTOSCOPY WITH STENT PLACEMENT Right 02/07/2020   Procedure: CYSTOSCOPY RIGHT RETROGRADE PYLEOGRAM WITH RIGHT STENT PLACEMENT;  Surgeon: Noel Christmas, MD;  [facility];  Service: Urology;  Laterality: Right;   HOLMIUM LASER APPLICATION Right 02/24/2020   Procedure: HOLMIUM LASER APPLICATION;  Surgeon: Marcine Matar, MD;  [facility];  Service: Urology;  Laterality: Right;   LEFT HEART CATHETERIZATION WITH CORONARY ANGIOGRAM N/A 10/26/2014   Procedure: LEFT HEART CATHETERIZATION WITH CORONARY ANGIOGRAM;  Surgeon: Lesleigh Noe, MD;  Location: [facility];  Service: Cardiovascular;  Laterality: N/A;   LITHOTRIPSY     MASS EXCISION Left 01/09/2018   Procedure: EXCISION SUBCUTANEOUS MASS, CHEST WALL;  Surgeon: Franky Macho, MD;  Location: [facility];  Service: General;  Laterality: Left;   NASAL SINUS SURGERY     PELVIC LYMPH NODE DISSECTION Bilateral 09/11/2018   Procedure: PELVIC LYMPH NODE DISSECTION;  Surgeon: Crist Fat, MD;  [facility]: [facility];  Service: Urology;  Laterality: Bilateral;   POLYPECTOMY  02/06/2022   Procedure: POLYPECTOMY;  Surgeon: Dolores Frame, MD;  [facility];  Service: Gastroenterology;;   ROBOT ASSISTED LAPAROSCOPIC RADICAL PROSTATECTOMY N/A 09/11/2018   Procedure: XI ROBOTIC ASSISTED LAPAROSCOPIC RADICAL PROSTATECTOMY;  Surgeon: Crist Fat, MD;  Location: [facility];  Service: Urology;  Laterality: N/A;   Patient Active Problem List   Diagnosis Date Noted   Radiculopathy due to cervical spondylosis at multiple levels 06/11/2023   Degeneration of lumbar intervertebral disc 05/04/2023  Class 2 obesity 04/14/2023   Chest pressure 04/14/2023   Acute sinusitis 10/29/2022   Chronic kidney disease, stage 4 (severe) (HCC) 06/28/2021   Ureteral calculus 02/07/2020   Prostate cancer (HCC) 09/11/2018   Lipoma of torso    Essential hypertension 10/26/2014   Hyperlipidemia 10/26/2014   GERD (gastroesophageal reflux disease) 10/26/2014   Chest pain    Shortness of breath 10/24/2014   Near syncope 10/24/2014   Dyspnea 10/24/2014    PCP: Benita Stabile, MDRef  Provider (PCP)   REFERRING PROVIDER: Oliver Barre, MD   REFERRING DIAG: [phone] (ICD-10-CM) - Radiculopathy due to cervical spondylosis at multiple levels   THERAPY DIAG:  Left shoulder pain, unspecified chronicity  Abnormal posture  Radiculopathy, cervical region  Rationale for Evaluation and Treatment: Rehabilitation  ONSET DATE: June 2024  SUBJECTIVE:                                                                                                                                                                                      SUBJECTIVE STATEMENT: Pt reports stable symptoms since prior visit, no medical or mediation updates.   PERTINENT HISTORY: Patient was on vacation and felt left pain down the arm to the fingers. Patient was cleared for any stroke or TIA and MRI 03/2023 and cleared for heart attack 04/2023. Patient was sent to MD 05/2023 - 06/2023. Patient saw Dr Dallas Schimke 07/22/23 who referred to OPPT. PMH: HTN Hand dominance: Right   PAIN:  Are you having pain? Yes 1-2/10 Left anterolateral deltoid to biceps region, stiffness in neck but no pain.   PRECAUTIONS: None  WEIGHT BEARING RESTRICTIONS: No  FALLS:  Has patient fallen in last 6 months? No  PATIENT GOALS: To reduce pain to 0%   NEXT MD VISIT: 08/25/23 with neurosurgical [place] Dr. Christell Constant   SENSATION: Pt reports full sensation   POSTURE: Significant protracted Left shoulder both in supine and seated  UPPER EXTREMITY ROM:  Shoulder ROM screening: overhead reach, full range, unrestricted mild tenderness  Cervical Radiculopathy Test Cluster:  ULTT: median, radial, ulnar (all negative tests, normal neural stretch bilat)  Spurling's A & B: both negative  Cervical distraction: Negative  UPPER EXTREMITY MMT: MMT Right 08/07/23 Left 08/07/23  Shoulder flexion 4 3+ pain  Shoulder abduction 3 3+ pain  Shoulder internal rotation  5/5 5/5*  Shoulder external rotation  5/5  5/5*  Elbow flexion 5/5   5/5   Elbow extension 5/5   5/5  Wrist flexion 5/5  5/5   Wrist extension  5/5  5/5  Italics for previous testing date   Grip Strength: Rt: 128lbs, 130lb, 120lb; Lt: 135lb, 130lb  TODAY'S TREATMENT:                                                                                                                                         DATE: 08/07/23  -Left scapular retraction/pec minor stretch in supine 2x30sec -left pec minor manual release x60sec  -manual release to left infraspinatus x60sec -review of seated scapular retraction HEP  -multiple trails of doorway pec stretch , none successful due to recurrent pain exacerbation in forearm    PATIENT EDUCATION: Education details: HEP adjustments, role of pec minor shortening in thoracic outlet syndrome Person educated: Patient Education method: Explanation Education comprehension: verbalized understanding  HOME EXERCISE PROGRAM: -asked to resume seated scapualr retraction holds and to refrain from doorframe pec stretches at visit 2   ASSESSMENT:  CLINICAL IMPRESSION: Continued to objective tests and measures remaining after initial visit. Objective measures this date reveal no frank weakness to indicate musculotendinous injury and test cluster for radiculopathy is almost exclusively negative at this point. Most revealing in today's session, reproduced pain in volar brachium/ during attempts at pec doorway stretch, made worse by 2nd phase cervical side bending to left or cervical flexion, but unchanged with contralateral side bending. Discussed with patient significance of adaptive pec minor shortening that maintains left shoulder in a protracted position, explained how this could trigger impingement and/or tinnel phenomenon in brachial plexus here. Generally good tolerance to supine manual stretching and manual release. Familiar tenderness about the middle trap and infraspinatus left both of which improve with sustained release techniques.  At this point difficult to say etiology of current symptoms but many areas of residual dysfunction that would benefit from being addressed, many of which could certainly be the result of chronic residual impairment in the setting of a scarred over discogenic lesion with radicular manifestations from 4 months prior. The patient will benefit from PT to address the limitations/impairments listed below to return to their prior level of function in the domains of activity and participation.   PERSONAL FACTORS:  n/a  are also affecting patient's functional outcome.   REHAB POTENTIAL: Excellent  CLINICAL DECISION MAKING: Stable/uncomplicated  EVALUATION COMPLEXITY: Low  GOALS: Goals reviewed with patient? No  SHORT TERM GOALS: Target date: 08/19/2023   Patient will be independent with a basic stretching/strengthening HEP  Baseline: Goal status: INITIAL   2.  Patient will be able to lift an object of 10lbs from a hip height off of a stable surface with 1-2/10 pain to facilitate ADL completion and self-care  Baseline:  Goal status: INITIAL   LONG TERM GOALS: Target date: 09/09/2023   Patient will be independent with a comprehensive strengthening HEP  Baseline:  Goal status: INITIAL  2.   Patient will improve  pain scale  score to 0/10 to facilitate pain-free ADL completion, lifting/carrying objects, reaching, and self-care  Baseline:  Goal status: INITIAL  3.  Patient will be  able to lift an object of 10lbs overhead with 0/10 pain to facilitate ADL completion and self-care  Baseline:  Goal status: INITIAL    PLAN: PT FREQUENCY: 1-2x/week  PT DURATION: 6 weeks  PLANNED INTERVENTIONS: Therapeutic exercises, Therapeutic activity, Neuromuscular re-education, Balance training, Gait training, Patient/Family education, Self Care, Joint mobilization, Joint manipulation, Dry Needling, Spinal manipulation, Spinal mobilization, Cryotherapy, Moist heat, and Manual therapy  PLAN FOR NEXT  SESSION: Assessment of Left anterior shoulder: pec minor, elbow flexors, triceps; trial pec minor stretches for home.     Cormac Wint C, PT 08/07/2023, 4:16 PM   Rosamaria Lints, PT Physical Therapist [facility] [place]  [phone]

## 2023-08-08 ENCOUNTER — Ambulatory Visit (HOSPITAL_COMMUNITY): Payer: BC Managed Care – PPO

## 2023-08-08 DIAGNOSIS — R293 Abnormal posture: Secondary | ICD-10-CM

## 2023-08-08 DIAGNOSIS — M5412 Radiculopathy, cervical region: Secondary | ICD-10-CM | POA: Diagnosis not present

## 2023-08-08 DIAGNOSIS — M25512 Pain in left shoulder: Secondary | ICD-10-CM

## 2023-08-08 DIAGNOSIS — M4722 Other spondylosis with radiculopathy, cervical region: Secondary | ICD-10-CM | POA: Diagnosis not present

## 2023-08-08 NOTE — Therapy (Signed)
[facility] OUTPATIENT PHYSICAL THERAPY TREATMENT   Patient Name: Anthony Anderson MRN: [id number] DOB:09/23/1961, 62 y.o., male Today's Date: 08/08/2023  END OF SESSION:   PT End of Session - 08/08/23 1527     Visit Number 3    Number of Visits 12    Date for PT Re-Evaluation 09/09/23    Authorization Type [employer] PPO-no auth no limit    Authorization Time Period 07/29/23-09/09/23    Progress Note Due on Visit 10    PT Start Time 1523    PT Stop Time 1555    PT Time Calculation (min) 32 min    Activity Tolerance Patient tolerated treatment well;No increased pain;Patient limited by pain    Behavior During Therapy [facility] for tasks assessed/performed             Past Medical History:  Diagnosis Date   Cancer [facility])    prostate, currently watching for any changes   Cholelithiasis    Dyspnea 2015   Dysrhythmia    GERD (gastroesophageal reflux disease)    History of hiatal hernia    History of kidney stones    Hyperlipidemia    Hypertension    Kidney stones    PSA elevation    Sleep apnea    uses CPAP   Past Surgical History:  Procedure Laterality Date   CARDIAC CATHETERIZATION     COLONOSCOPY WITH PROPOFOL N/A 02/06/2022   Procedure: COLONOSCOPY WITH PROPOFOL;  Surgeon: Dolores Frame, MD;  [facility];  Service: Gastroenterology;  Laterality: N/A;  10:00   CYSTOSCOPY W/ URETERAL STENT REMOVAL Right 02/24/2020   Procedure: CYSTOSCOPY WITH STENT REMOVAL;  Surgeon: Marcine Matar, MD;  Location: [facility];  Service: Urology;  Laterality: Right;  1 HR   CYSTOSCOPY WITH RETROGRADE PYELOGRAM, URETEROSCOPY AND STENT PLACEMENT Right 02/24/2020   Procedure: CYSTOSCOPY WITH RETROGRADE PYELOGRAM, URETEROSCOPY AND STENT PLACEMENT;  Surgeon: Marcine Matar, MD;  [facility]: [facility];  Service: Urology;  Laterality: Right;   CYSTOSCOPY WITH STENT PLACEMENT Right 02/07/2020   Procedure: CYSTOSCOPY RIGHT RETROGRADE PYLEOGRAM WITH RIGHT STENT PLACEMENT;  Surgeon: Noel Christmas, MD;  [facility]: [facility];  Service: Urology;  Laterality: Right;   HOLMIUM LASER APPLICATION Right 02/24/2020   Procedure: HOLMIUM LASER APPLICATION;  Surgeon: Marcine Matar, MD;  [facility]: [facility];  Service: Urology;  Laterality: Right;   LEFT HEART CATHETERIZATION WITH CORONARY ANGIOGRAM N/A 10/26/2014   Procedure: LEFT HEART CATHETERIZATION WITH CORONARY ANGIOGRAM;  Surgeon: Lesleigh Noe, MD;  Location: [facility] CATH LAB;  Service: Cardiovascular;  Laterality: N/A;   LITHOTRIPSY     MASS EXCISION Left 01/09/2018   Procedure: EXCISION SUBCUTANEOUS MASS, CHEST WALL;  Surgeon: Franky Macho, MD;  Location: [facility];  Service: General;  Laterality: Left;   NASAL SINUS SURGERY     PELVIC LYMPH NODE DISSECTION Bilateral 09/11/2018   Procedure: PELVIC LYMPH NODE DISSECTION;  Surgeon: Crist Fat, MD;  [facility]: [facility];  Service: Urology;  Laterality: Bilateral;   POLYPECTOMY  02/06/2022   Procedure: POLYPECTOMY;  Surgeon: Dolores Frame, MD;  [facility];  Service: Gastroenterology;;   ROBOT ASSISTED LAPAROSCOPIC RADICAL PROSTATECTOMY N/A 09/11/2018   Procedure: XI ROBOTIC ASSISTED LAPAROSCOPIC RADICAL PROSTATECTOMY;  Surgeon: Crist Fat, MD;  Location: [facility];  Service: Urology;  Laterality: N/A;   Patient Active Problem List   Diagnosis Date Noted   Radiculopathy due to cervical spondylosis at multiple levels 06/11/2023   Degeneration of lumbar intervertebral disc 05/04/2023  Class 2 obesity 04/14/2023   Chest pressure 04/14/2023   Acute sinusitis 10/29/2022   Chronic kidney disease, stage 4 (severe) (HCC) 06/28/2021   Ureteral calculus 02/07/2020   Prostate cancer (HCC) 09/11/2018   Lipoma of torso    Essential hypertension 10/26/2014   Hyperlipidemia 10/26/2014   GERD (gastroesophageal reflux disease) 10/26/2014   Chest pain    Shortness of breath 10/24/2014   Near syncope 10/24/2014   Dyspnea 10/24/2014    PCP: Benita Stabile, MDRef  Provider (PCP)   REFERRING PROVIDER: Oliver Barre, MD   REFERRING DIAG: [phone] (ICD-10-CM) - Radiculopathy due to cervical spondylosis at multiple levels   THERAPY DIAG:  Left shoulder pain, unspecified chronicity  Abnormal posture  Radiculopathy, cervical region  Rationale for Evaluation and Treatment: Rehabilitation  ONSET DATE: June 2024  SUBJECTIVE:                                                                                                                                                                                      SUBJECTIVE STATEMENT: Pt reports mild pain exacerbation since yesterday, also some low level beck pain. No other updates. Needs to leave a little early today to make it to the bank.   PERTINENT HISTORY: Patient was on vacation and felt left pain down the arm to the fingers. Patient was cleared for any stroke or TIA and MRI 03/2023 and cleared for heart attack 04/2023. Patient was sent to MD 05/2023 - 06/2023. Patient saw Dr Dallas Schimke 07/22/23 who referred to OPPT. PMH: HTN Hand dominance: Right   PAIN:  Are you having pain? Yes 4/10 Left anterolateral deltoid to biceps region, milder pain in neck .   PRECAUTIONS: None  WEIGHT BEARING RESTRICTIONS: No  FALLS:  Has patient fallen in last 6 months? No  PATIENT GOALS: To reduce pain to 0%      TODAY'S TREATMENT:  DATE: 08/08/23  -UBE x4 minutes, alternating direction Q60sec, reports some decease in soreness around 2 minutes  -hook lying on wide table with axial towel roll, and hot pack to neck: pec minor appears less restricted today by far, less so that CL side -wand flexion in the above position x20 good reduction of pain at this point -LUE chest press 8lb (has increased) pain at very top (impingement like) asked to avoid painful zone x12  -LUE flexion (scaption) 0  to 90 degree 1x15 (supine)  -LUE flexion (scaption) 0 to 90 degree 1x15 (supine)   -standing LUe elbow flexion neutral grip 8lb free weight 1x12 -standing Left elbow extension 1x12 @ GTB -standing LUe elbow flexion neutral grip 8lb free weight 1x12 -standing Left elbow extension 1x12 @ GTB *reports improved symptoms at end of session  PATIENT EDUCATION: Education details: HEP adjustments, role of pec minor shortening in thoracic outlet syndrome Person educated: Patient Education method: Explanation Education comprehension: verbalized understanding  HOME EXERCISE PROGRAM: -asked to resume seated scapualr retraction holds and to refrain from doorframe pec stretches at visit 2   ASSESSMENT:  CLINICAL IMPRESSION: Targeted thoracic extension ROM and commenced with moderate loading to left shoulder to ascertain response. The patient will benefit from PT to address the limitations/impairments listed below to return to their prior level of function in the domains of activity and participation.   PERSONAL FACTORS:  n/a  are also affecting patient's functional outcome.   REHAB POTENTIAL: Excellent  CLINICAL DECISION MAKING: Stable/uncomplicated  EVALUATION COMPLEXITY: Low  GOALS: Goals reviewed with patient? No  SHORT TERM GOALS: Target date: 08/19/2023   Patient will be independent with a basic stretching/strengthening HEP  Baseline: Goal status: INITIAL   2.  Patient will be able to lift an object of 10lbs from a hip height off of a stable surface with 1-2/10 pain to facilitate ADL completion and self-care  Baseline:  Goal status: INITIAL   LONG TERM GOALS: Target date: 09/09/2023   Patient will be independent with a comprehensive strengthening HEP  Baseline:  Goal status: INITIAL  2.   Patient will improve  pain scale  score to 0/10 to facilitate pain-free ADL completion, lifting/carrying objects, reaching, and self-care  Baseline:  Goal status: INITIAL  3.  Patient  will be able to lift an object of 10lbs overhead with 0/10 pain to facilitate ADL completion and self-care  Baseline:  Goal status: INITIAL    PLAN: PT FREQUENCY: 1-2x/week  PT DURATION: 6 weeks  PLANNED INTERVENTIONS: Therapeutic exercises, Therapeutic activity, Neuromuscular re-education, Balance training, Gait training, Patient/Family education, Self Care, Joint mobilization, Joint manipulation, Dry Needling, Spinal manipulation, Spinal mobilization, Cryotherapy, Moist heat, and Manual therapy  PLAN FOR NEXT SESSION: start deltoid dry needling, continue with shoulder loading     Stephana Morell C, PT 08/08/2023, 3:36 PM   Rosamaria Lints, PT Physical Therapist [facility] [place]  [phone]

## 2023-08-12 ENCOUNTER — Ambulatory Visit (HOSPITAL_COMMUNITY): Payer: BC Managed Care – PPO

## 2023-08-12 DIAGNOSIS — M25512 Pain in left shoulder: Secondary | ICD-10-CM | POA: Diagnosis not present

## 2023-08-12 DIAGNOSIS — M5412 Radiculopathy, cervical region: Secondary | ICD-10-CM | POA: Diagnosis not present

## 2023-08-12 DIAGNOSIS — M4722 Other spondylosis with radiculopathy, cervical region: Secondary | ICD-10-CM | POA: Diagnosis not present

## 2023-08-12 DIAGNOSIS — R293 Abnormal posture: Secondary | ICD-10-CM

## 2023-08-12 NOTE — Therapy (Signed)
[facility] OUTPATIENT PHYSICAL THERAPY TREATMENT   Patient Name: Anthony Anderson MRN: [id number] DOB:[birth year], 62 y.o., male Today's Date: 08/12/2023  END OF SESSION:   PT End of Session - 08/12/23 1637     Visit Number 4    Number of Visits 12    Date for PT Re-Evaluation 09/09/23    Authorization Type [employer] PPO-no auth no limit    Authorization Time Period 07/29/23-09/09/23    Progress Note Due on Visit 10             Past Medical History:  Diagnosis Date   Cancer [facility])    prostate, currently watching for any changes   Cholelithiasis    Dyspnea 2015   Dysrhythmia    GERD (gastroesophageal reflux disease)    History of hiatal hernia    History of kidney stones    Hyperlipidemia    Hypertension    Kidney stones    PSA elevation    Sleep apnea    uses CPAP   Past Surgical History:  Procedure Laterality Date   CARDIAC CATHETERIZATION     COLONOSCOPY WITH PROPOFOL N/A 02/06/2022   Procedure: COLONOSCOPY WITH PROPOFOL;  Surgeon: Dolores Frame, MD;  [facility];  Service: Gastroenterology;  Laterality: N/A;  10:00   CYSTOSCOPY W/ URETERAL STENT REMOVAL Right 02/24/2020   Procedure: CYSTOSCOPY WITH STENT REMOVAL;  Surgeon: Marcine Matar, MD;  [facility];  Service: Urology;  Laterality: Right;  1 HR   CYSTOSCOPY WITH RETROGRADE PYELOGRAM, URETEROSCOPY AND STENT PLACEMENT Right 02/24/2020   Procedure: CYSTOSCOPY WITH RETROGRADE PYELOGRAM, URETEROSCOPY AND STENT PLACEMENT;  Surgeon: Marcine Matar, MD;  [facility];  Service: Urology;  Laterality: Right;   CYSTOSCOPY WITH STENT PLACEMENT Right 02/07/2020   Procedure: CYSTOSCOPY RIGHT RETROGRADE PYLEOGRAM WITH RIGHT STENT PLACEMENT;  Surgeon: Noel Christmas, MD;  [facility];  Service: Urology;  Laterality: Right;   HOLMIUM LASER APPLICATION Right 02/24/2020   Procedure: HOLMIUM LASER APPLICATION;  Surgeon: Marcine Matar, MD;  [facility];  Service: Urology;  Laterality:  Right;   LEFT HEART CATHETERIZATION WITH CORONARY ANGIOGRAM N/A 10/26/2014   Procedure: LEFT HEART CATHETERIZATION WITH CORONARY ANGIOGRAM;  Surgeon: Lesleigh Noe, MD;  Location: [facility] CATH LAB;  Service: Cardiovascular;  Laterality: N/A;   LITHOTRIPSY     MASS EXCISION Left 01/09/2018   Procedure: EXCISION SUBCUTANEOUS MASS, CHEST WALL;  Surgeon: Franky Macho, MD;  Location: [facility];  Service: General;  Laterality: Left;   NASAL SINUS SURGERY     PELVIC LYMPH NODE DISSECTION Bilateral 09/11/2018   Procedure: PELVIC LYMPH NODE DISSECTION;  Surgeon: Crist Fat, MD;  [facility];  Service: Urology;  Laterality: Bilateral;   POLYPECTOMY  02/06/2022   Procedure: POLYPECTOMY;  Surgeon: Dolores Frame, MD;  [facility];  Service: Gastroenterology;;   ROBOT ASSISTED LAPAROSCOPIC RADICAL PROSTATECTOMY N/A 09/11/2018   Procedure: XI ROBOTIC ASSISTED LAPAROSCOPIC RADICAL PROSTATECTOMY;  Surgeon: Crist Fat, MD;  [facility];  Service: Urology;  Laterality: N/A;   Patient Active Problem List   Diagnosis Date Noted   Radiculopathy due to cervical spondylosis at multiple levels 06/11/2023   Degeneration of lumbar intervertebral disc 05/04/2023   Class 2 obesity 04/14/2023   Chest pressure 04/14/2023   Acute sinusitis 10/29/2022   Chronic kidney disease, stage 4 (severe) (HCC) 06/28/2021   Ureteral calculus 02/07/2020   Prostate cancer (HCC) 09/11/2018   Lipoma of torso    Essential hypertension  10/26/2014   Hyperlipidemia 10/26/2014   GERD (gastroesophageal reflux disease) 10/26/2014   Chest pain    Shortness of breath 10/24/2014   Near syncope 10/24/2014   Dyspnea 10/24/2014    PCP: Benita Stabile, MDRef Provider (PCP)   REFERRING PROVIDER: Oliver Barre, MD   REFERRING DIAG: [phone] (ICD-10-CM) - Radiculopathy due to cervical spondylosis at multiple levels   THERAPY DIAG:  Left shoulder pain, unspecified chronicity  Abnormal  posture  Radiculopathy, cervical region  Rationale for Evaluation and Treatment: Rehabilitation  ONSET DATE: June 2024  SUBJECTIVE:                                                                                                                                                                                      SUBJECTIVE STATEMENT: Pt reports relief following prior session until next morning, was in quite a bit of pain next 2 days, but better eventually. No other updates.   PERTINENT HISTORY: Patient was on vacation and felt left pain down the arm to the fingers. Patient was cleared for any stroke or TIA and MRI 03/2023 and cleared for heart attack 04/2023. Patient was sent to MD 05/2023 - 06/2023. Patient saw Dr Dallas Schimke 07/22/23 who referred to OPPT. PMH: HTN Hand dominance: Right   PAIN:  Are you having pain? Yes 2/10 Left anterolateral deltoid to biceps region, stiffness only in neck .   PRECAUTIONS: None  WEIGHT BEARING RESTRICTIONS: No  FALLS:  Has patient fallen in last 6 months? No  PATIENT GOALS: To reduce pain to 0%      TODAY'S TREATMENT:                                                                                                                                         DATE:   08/13/23 -UBE level 2 x4 minutes, alternating directions Q60se -dry needling to Left anterior deltoid 0.30x79mm, unable to elicit twitch response -dry needling to latearl deltoid, 0.30x65mm, unable to elicit twitch response -dry needling to elbow flexors 0.30x56mm, able to elicit large twitch  response on medial head of biceps -return to lateral deltoid for sustained release techniques, does have reduction in taut bands and tenderness  -supine wand flexion x20 -Left GHJ Er/IR c 4lb FW x15 (inititally has end ER pain, but when ABDCT angle is reduced to 60 degrees, can find a pain-free range) -chest press c 5lb FW x10 (avoidance of full extension ROM with bolster)  -standing Left elbow  flexion 1x12 @ 8lb -standing left elbow extension 1x12 @ GTB resistance  -standing BUE GHJ ER with BTB 1x12  -standing LUE shoulder flexion to 90 degrees @ 4lb (painful)  -standing LUE scaption to 90 degrees @ 3lb x12 (pain free)    08/08/23 -UBE x4 minutes, alternating direction Q60sec, reports some decease in soreness around 2 minutes  -hook lying on wide table with axial towel roll, and hot pack to neck: pec minor appears less restricted today by far, less so that CL side -wand flexion in the above position x20 good reduction of pain at this point -LUE chest press 8lb (has increased) pain at very top (impingement like) asked to avoid painful zone x12  -LUE flexion (scaption) 0 to 90 degree 1x15 (supine)  -LUE flexion (scaption) 0 to 90 degree 1x15 (supine)   -standing LUe elbow flexion neutral grip 8lb free weight 1x12 -standing Left elbow extension 1x12 @ GTB -standing LUe elbow flexion neutral grip 8lb free weight 1x12 -standing Left elbow extension 1x12 @ GTB *reports improved symptoms at end of session  PATIENT EDUCATION: Education details: HEP adjustments, role of pec minor shortening in thoracic outlet syndrome Person educated: Patient Education method: Explanation Education comprehension: verbalized understanding  HOME EXERCISE PROGRAM: -asked to resume seated scapualr retraction holds and to refrain from doorframe pec stretches at visit 2   ASSESSMENT:  CLINICAL IMPRESSION: Pain remains unpredictable at home, suspect more triggering from sleeping postures, discussed with patient. Trial of dry needling today difficult to achieve response, (+) widespread spasm in deltoid, but elbow flexors also responsive. Multiple elements of symptoms response today consistent with SAIS in left shoulder more than pure left radicular phenomenon, however mild elements of neck pathology seem to remains in the background, mostly a stiffness issue at present. Cannot r/o anterior labral injury or  other intraarticular phenomenon. Will transition HEP to 4 way isometrics in shoulder next day and continue with pain-free range sub maximal loading to elicit greater long-term pain control. The patient will benefit from PT to address the limitations/impairments listed below to return to their prior level of function in the domains of activity and participation.   PERSONAL FACTORS:  n/a  are also affecting patient's functional outcome.   REHAB POTENTIAL: Excellent  CLINICAL DECISION MAKING: Stable/uncomplicated  EVALUATION COMPLEXITY: Low  GOALS: Goals reviewed with patient? No  SHORT TERM GOALS: Target date: 08/19/2023   Patient will be independent with a basic stretching/strengthening HEP  Baseline: Goal status: INITIAL   2.  Patient will be able to lift an object of 10lbs from a hip height off of a stable surface with 1-2/10 pain to facilitate ADL completion and self-care  Baseline:  Goal status: INITIAL   LONG TERM GOALS: Target date: 09/09/2023   Patient will be independent with a comprehensive strengthening HEP  Baseline:  Goal status: INITIAL  2.   Patient will improve  pain scale  score to 0/10 to facilitate pain-free ADL completion, lifting/carrying objects, reaching, and self-care  Baseline:  Goal status: INITIAL  3.  Patient will be able to lift an object  of 10lbs overhead with 0/10 pain to facilitate ADL completion and self-care  Baseline:  Goal status: INITIAL    PLAN: PT FREQUENCY: 1-2x/week  PT DURATION: 6 weeks  PLANNED INTERVENTIONS: Therapeutic exercises, Therapeutic activity, Neuromuscular re-education, Balance training, Gait training, Patient/Family education, Self Care, Joint mobilization, Joint manipulation, Dry Needling, Spinal manipulation, Spinal mobilization, Cryotherapy, Moist heat, and Manual therapy  PLAN FOR NEXT SESSION: FU on deltoid, elbow flexors dry needling from visit 4, issue 4 way shoulder isometrics for home.    Quamesha Mullet  C, PT 08/12/2023, 4:43 PM   Rosamaria Lints, PT Physical Therapist [facility] [place]  [phone]

## 2023-08-15 ENCOUNTER — Ambulatory Visit (HOSPITAL_COMMUNITY): Payer: BC Managed Care – PPO

## 2023-08-15 DIAGNOSIS — M5412 Radiculopathy, cervical region: Secondary | ICD-10-CM | POA: Diagnosis not present

## 2023-08-15 DIAGNOSIS — M4722 Other spondylosis with radiculopathy, cervical region: Secondary | ICD-10-CM | POA: Diagnosis not present

## 2023-08-15 DIAGNOSIS — R293 Abnormal posture: Secondary | ICD-10-CM

## 2023-08-15 DIAGNOSIS — M25512 Pain in left shoulder: Secondary | ICD-10-CM | POA: Diagnosis not present

## 2023-08-15 NOTE — Therapy (Signed)
[facility] OUTPATIENT PHYSICAL THERAPY TREATMENT   Patient Name: Anthony Anderson MRN: [id number] DOB:01-03-1961, 62 y.o., male Today's Date: 08/15/2023  END OF SESSION:   PT End of Session - 08/15/23 1559     Visit Number 5    Number of Visits 12    Date for PT Re-Evaluation 09/09/23    Authorization Type [employer] P[employer]-no auth no limit    Authorization Time Period 07/29/23-09/09/23    Progress Note Due on Visit 10    PT Start Time 1550    PT Stop Time 1630    PT Time Calculation (min) 40 min    Activity Tolerance Patient tolerated treatment well;No increased pain;Patient limited by pain    Behavior During Therapy [facility] for tasks assessed/performed             Past Medical History:  Diagnosis Date   Cancer [facility])    prostate, currently watching for any changes   Cholelithiasis    Dyspnea 2015   Dysrhythmia    GERD (gastroesophageal reflux disease)    History of hiatal hernia    History of kidney stones    Hyperlipidemia    Hypertension    Kidney stones    PSA elevation    Sleep apnea    uses CPAP   Past Surgical History:  Procedure Laterality Date   CARDIAC CATHETERIZATION     COLONOSCOPY WITH PROPOFOL N/A 02/06/2022   Procedure: COLONOSCOPY WITH PROPOFOL;  Surgeon: Dolores Frame, MD;  [facility]: [facility];  Service: Gastroenterology;  Laterality: N/A;  10:00   CYSTOSCOPY W/ URETERAL STENT REMOVAL Right 02/24/2020   Procedure: CYSTOSCOPY WITH STENT REMOVAL;  Surgeon: Marcine Matar, MD;  Location: [facility];  Service: Urology;  Laterality: Right;  1 HR   CYSTOSCOPY WITH RETROGRADE PYELOGRAM, URETEROSCOPY AND STENT PLACEMENT Right 02/24/2020   Procedure: CYSTOSCOPY WITH RETROGRADE PYELOGRAM, URETEROSCOPY AND STENT PLACEMENT;  Surgeon: Marcine Matar, MD;  Location: [facility];  Service: Urology;  Laterality: Right;   CYSTOSCOPY WITH STENT PLACEMENT Right 02/07/2020   Procedure: CYSTOSCOPY RIGHT RETROGRADE PYLEOGRAM WITH RIGHT STENT PLACEMENT;  Surgeon: Noel Christmas, MD;  Location: [facility];  Service: Urology;  Laterality: Right;   HOLMIUM LASER APPLICATION Right 02/24/2020   Procedure: HOLMIUM LASER APPLICATION;  Surgeon: Marcine Matar, MD;  Location: [facility];  Service: Urology;  Laterality: Right;   LEFT HEART CATHETERIZATION WITH CORONARY ANGIOGRAM N/A 10/26/2014   Procedure: LEFT HEART CATHETERIZATION WITH CORONARY ANGIOGRAM;  Surgeon: Lesleigh Noe, MD;  Location: [facility] CATH LAB;  Service: Cardiovascular;  Laterality: N/A;   LITHOTRIPSY     MASS EXCISION Left 01/09/2018   Procedure: EXCISION SUBCUTANEOUS MASS, CHEST WALL;  Surgeon: Franky Macho, MD;  Location: [facility];  Service: General;  Laterality: Left;   NASAL SINUS SURGERY     PELVIC LYMPH NODE DISSECTION Bilateral 09/11/2018   Procedure: PELVIC LYMPH NODE DISSECTION;  Surgeon: Crist Fat, MD;  Location: [facility];  Service: Urology;  Laterality: Bilateral;   POLYPECTOMY  02/06/2022   Procedure: POLYPECTOMY;  Surgeon: Dolores Frame, MD;  [facility];  Service: Gastroenterology;;   ROBOT ASSISTED LAPAROSCOPIC RADICAL PROSTATECTOMY N/A 09/11/2018   Procedure: XI ROBOTIC ASSISTED LAPAROSCOPIC RADICAL PROSTATECTOMY;  Surgeon: Crist Fat, MD;  Location: [facility];  Service: Urology;  Laterality: N/A;   Patient Active Problem List   Diagnosis Date Noted   Radiculopathy due to cervical spondylosis at multiple levels 06/11/2023   Degeneration of lumbar intervertebral disc 05/04/2023  Class 2 obesity 04/14/2023   Chest pressure 04/14/2023   Acute sinusitis 10/29/2022   Chronic kidney disease, stage 4 (severe) (HCC) 06/28/2021   Ureteral calculus 02/07/2020   Prostate cancer (HCC) 09/11/2018   Lipoma of torso    Essential hypertension 10/26/2014   Hyperlipidemia 10/26/2014   GERD (gastroesophageal reflux disease) 10/26/2014   Chest pain    Shortness of breath 10/24/2014   Near syncope 10/24/2014   Dyspnea 10/24/2014    PCP: Benita Stabile, MDRef  Provider (PCP)   REFERRING PROVIDER: Oliver Barre, MD   REFERRING DIAG: [phone] (ICD-10-CM) - Radiculopathy due to cervical spondylosis at multiple levels   THERAPY DIAG:  Left shoulder pain, unspecified chronicity  Abnormal posture  Radiculopathy, cervical region  Rationale for Evaluation and Treatment: Rehabilitation  ONSET DATE: June 2024  SUBJECTIVE:                                                                                                                                                                                      SUBJECTIVE STATEMENT: Pain has been pretty good since last session. No other update. No overt soreness attributed to dry needling.    PERTINENT HISTORY: Patient was on vacation and felt left pain down the arm to the fingers. Patient was cleared for any stroke or TIA and MRI 03/2023 and cleared for heart attack 04/2023. Patient was sent to MD 05/2023 - 06/2023. Patient saw Dr Dallas Schimke 07/22/23 who referred to OPPT. PMH: HTN Hand dominance: Right   PAIN:  Are you having pain? Yes 1/10 Left anterolateral deltoid    PRECAUTIONS: None  WEIGHT BEARING RESTRICTIONS: No  FALLS:  Has patient fallen in last 6 months? No  PATIENT GOALS: To reduce pain to 0%      TODAY'S TREATMENT:                                                                                                                                         DATE:   08/15/23 -UBE level 2 x4 minutes, alternating Q 60sec  En  supine, neck on traction towel roll -axial towel roll with wand flexion x25  -LUE GHJ isometric double towel roll squeeze 15x5secH  -isometric BUE GHJ ER 15x5sec H (gait belt resistance, bilat forearms in supination)  -isometric BUE GHJ IR in neutral 15x5secH -isometric GHJ extension into double towel roll 15x5secH   -supine LUE elbow extension/flexion 90 to 0 degrees 1x15 @ 8lb (cues for isometric scapular stabilization)  -supine LUE GHJ ER/I starting at 0 degrees  rotation and 45 degrees ABDCT, 0 degrees flexion/extension 1x15 @ 4lb (pain free range)  -supine LUE elbow extension/flexion 90 to 0 degrees 1x15 @ 8lb (cues for isometric scapular stabilization)  -supine LUE GHJ ER/I starting at 0 degrees rotation and 45 degrees ABDCT, 0 degrees flexion/extension 1x15 @ 4lb (pain free range)   Dry needling:  Supine, left anterior deltoid, 0.26mm x 40mm using 4 needle technique along taut band, low pistoning, several large twitches noted;  *greater than typical stinging sensation in some place, I can only attribute to continued neurosensory changes from left cervical radicular episode a few months back.  *pretty good tolerance overall, attests to feeling deltoid twitches  Standing -LUE standing scaption front raise to 90 degrees 1x12 @ 4lb (increased weight today by 1lb)  -LLE standing lateral raise to 90 degrees with 2lb weight 1x12 (low weight to take fatigue into account from previous)  -2 minute recovery  -LUE standing scaption front raise to 90 degrees 1x10 @ 4lb -LLE standing lateral raise to 90 degrees with 2lb weight 1x12 (low weight to take fatigue into account from previous)  Issued HEP    PATIENT EDUCATION: Education details: HEP adjustments, role of pec minor shortening in thoracic outlet syndrome Person educated: Patient Education method: Explanation Education comprehension: verbalized understanding  HOME EXERCISE PROGRAM: Access Code: [email or web address] URL: [email or web address] Date: 08/15/2023 Prepared by: Alvera Novel  Exercises - Isometric Shoulder Adduction  - 3 x daily - 7 x weekly - 1 sets - 15 reps - 5 sec hold - Shoulder external rotation, scapular retraction RED band  - 3 x daily - 7 x weekly - 1 sets - 15 reps - 5sech hold  ASSESSMENT:  CLINICAL IMPRESSION: Now that we are fully transitioned from cervical radic treatment and honed in on Left subacromial impingement, pt is experiencing decreased pain between  sessions, decreased pain during and within session, and progressive improvement in tolerated range. Used an alternative technique for DN left deltoid spasm, finally able to elicit multiple twitch responses with multi needle technique. HEP issue ofr isometric rotator cuff activation in neurtral posuring. I do feel that pt sill has some lingering Left radicular neural tension triggered by sustained cervical flexion and/or CL side bending, but we are able to be mindful of this during activity and posture selection in session. ROM assessment of LUE revealing GHJ ER to 58 degrees, setting a goal today to get to >75 for DC.  The patient will benefit from PT to address the limitations/impairments listed below to return to their prior level of function in the domains of activity and participation.   PERSONAL FACTORS:  n/a  are also affecting patient's functional outcome.   REHAB POTENTIAL: Excellent  CLINICAL DECISION MAKING: Stable/uncomplicated  EVALUATION COMPLEXITY: Low  GOALS: Goals reviewed with patient? No  SHORT TERM GOALS: Target date: 08/19/2023   Patient will be independent with a basic stretching/strengthening HEP  Baseline: Goal status: INITIAL   2.  Patient will be able to lift an object of 10lbs from a hip height  off of a stable surface with 1-2/10 pain to facilitate ADL completion and self-care  Baseline:  Goal status: INITIAL   LONG TERM GOALS: Target date: 09/09/2023   Patient will be independent with a comprehensive strengthening HEP  Baseline:  Goal status: INITIAL  2.   Patient will improve  pain scale  score to 0/10 to facilitate pain-free ADL completion, lifting/carrying objects, reaching, and self-care  Baseline:  Goal status: INITIAL  3.  Patient will be able to lift an object of 10lbs overhead with 0/10 pain to facilitate ADL completion and self-care  Baseline:  Goal status: INITIAL  4.  Patient will demonstrate Left GHJ ER >70 degrees at >80 degrees of  abduction to improve pain free performance of ADL, work duties  Baseline:  Goal status: NEW 08/15/23    PLAN: PT FREQUENCY: 1-2x/week  PT DURATION: 6 weeks  PLANNED INTERVENTIONS: Therapeutic exercises, Therapeutic activity, Neuromuscular re-education, Balance training, Gait training, Patient/Family education, Self Care, Joint mobilization, Joint manipulation, Dry Needling, Spinal manipulation, Spinal mobilization, Cryotherapy, Moist heat, and Manual therapy  PLAN FOR NEXT SESSION: FU on 2 new shoulder isometric HEP, dry needling, progress intrinsic shoulder loading in tolerated ranges.    Kawthar Ennen C, PT 08/15/2023, 4:00 PM   Rosamaria Lints, PT Physical Therapist [facility] [place]  [phone]

## 2023-08-18 ENCOUNTER — Encounter (HOSPITAL_COMMUNITY): Payer: BC Managed Care – PPO

## 2023-08-19 ENCOUNTER — Telehealth (HOSPITAL_COMMUNITY): Payer: Self-pay

## 2023-08-19 DIAGNOSIS — G4733 Obstructive sleep apnea (adult) (pediatric): Secondary | ICD-10-CM | POA: Diagnosis not present

## 2023-08-19 NOTE — Telephone Encounter (Signed)
Pt did not arrive for scheduled appointment. No telephone call nor message preceeded this absence. Author attempted to contact pt via telephone number listed in chart. Pt reports unaware of appointment, forgot about this. Pt made aware of next visit. Pt reports shoulder has been feelign very good. Pt says recent HEP additional also going well.   Rosamaria Lints, PT Physical Therapist [facility] [place]  [phone]

## 2023-08-25 ENCOUNTER — Other Ambulatory Visit (INDEPENDENT_AMBULATORY_CARE_PROVIDER_SITE_OTHER): Payer: BC Managed Care – PPO

## 2023-08-25 ENCOUNTER — Ambulatory Visit (INDEPENDENT_AMBULATORY_CARE_PROVIDER_SITE_OTHER): Payer: BC Managed Care – PPO | Admitting: Orthopedic Surgery

## 2023-08-25 ENCOUNTER — Encounter: Payer: Self-pay | Admitting: Orthopedic Surgery

## 2023-08-25 VITALS — BP 149/92 | HR 74 | Ht 71.0 in | Wt 250.0 lb

## 2023-08-25 DIAGNOSIS — M4722 Other spondylosis with radiculopathy, cervical region: Secondary | ICD-10-CM

## 2023-08-25 MED ORDER — PREGABALIN 75 MG PO CAPS: 75.0000 mg | ORAL_CAPSULE | Freq: Two times a day (BID) | ORAL | 0 refills | Status: DC

## 2023-08-25 NOTE — Progress Notes (Addendum)
Orthopedic Spine Surgery Office Note  Assessment: Patient is a 62 y.o. male with neck pain that radiates into the left shoulder. Has foraminal stenosis at C3/4   Plan: -Explained that initially conservative treatment is tried as a significant number of patients may experience relief with these treatment modalities. Discussed that the conservative treatments include:  -activity modification  -physical therapy  -over the counter pain medications  -medrol dosepak  -cervical steroid injections -Patient has tried PT, tylenol, aleve, prednisone -Prescribed lyrica to try for additional pain relief -Recommended diagnostic/therapeutic cervical ESI -Patient should return to office in 6 weeks, x-rays at next visit: none   Patient expressed understanding of the plan and all questions were answered to the patient's satisfaction.   ___________________________________________________________________________   History:  Patient is a 62 y.o. male who presents today for cervical spine. Patient has had 4 months of neck pain that radiates into the left upper extremity. He feels it goes into the lateral shoulder and lateral arm to the level of the elbow. It does not radiate past the elbow. He has no pain radiating into the left upper extremity. Patient has pain with activity and rest. There was no trauma or injury that preceded the onset of pain.   Patient rates his pain at a 7/10 at its worst  Weakness: denies Difficulty with fine motor skills (e.g., buttoning shirts, handwriting): denies Symptoms of imbalance: denies Paresthesias and numbness: denies Bowel or bladder incontinence: denies Saddle anesthesia: denies  Treatments tried: PT, tylenol, aleve, prednisone  Review of systems: Denies fevers and chills, night sweats, unexplained weight loss. Has a history of prostate cancer. Has had pain that wakes him at night.   Past medical  history: Cholelithiasis Dysrhythmia Dyspnea GERD HLD HTN OSA  Allergies: NKDA  Past surgical history:  Cardiac catheterization Cystoscopy with stent placement Lithotripsy Chest wall mass excision Nasal sinus surgery Robotic prostatectomy  Social history: Denies use of nicotine product (smoking, vaping, patches, smokeless) Alcohol use: denies Denies recreational drug use   Physical Exam:  BMI of 34.9  General: no acute distress, appears stated age Neurologic: alert, answering questions appropriately, following commands Respiratory: unlabored breathing on room air, symmetric chest rise Psychiatric: appropriate affect, normal cadence to speech   MSK (spine):  -Strength exam      Left  Right Grip strength                5/5  5/5 Interosseus   5/5   5/5 Wrist extension  5/5  5/5 Wrist flexion   5/5  5/5 Elbow flexion   5/5  5/5 Deltoid    5/5  5/5  -Sensory exam    Sensation intact to light touch in C5-T1 nerve distributions of bilateral upper extremities  -Brachioradialis DTR: 2/4 on the left, 2/4 on the right -Biceps DTR: 2/4 on the left, 2/4 on the right  -Spurling: negative bilaterally -Hoffman sign: negative bilaterally -Clonus: no beats bilaterally -Interosseous wasting: none seen -Grip and release test: negative -Romberg: negative -Gait: normal  Left shoulder exam: pain with external rotation past 80 degrees, negative jobe, negative belly press, no weakness with external rotation with arm at side Right shoulder exam: no pain through range of motion  Tinel's at wrist: negative bilaterally  Phalen's at wrist: negative bilaterally  Durkan's: negative bilaterally   Tinel's at elbow: negative bilaterally   Imaging: XRs of the cervical spine from 08/25/2023 were independently reviewed and interpreted, showing C3/4 disc height loss with anterior osteophyte formation. C5/6 disc height loss  as well. No fracture or dislocation seen. No evidence of  instability on flexion/extension.   MRI of the cervical spine from 04/24/2023 was independently reviewed and interpreted, showing bilateral foraminal stenosis at C3/4. Right sided foraminal stenosis at C5/6. No other stenosis seen. No T2 cord signal change seen.    Patient name: Anthony Anderson Patient MRN: [id number] Date of visit: 08/25/23

## 2023-08-26 ENCOUNTER — Ambulatory Visit (HOSPITAL_COMMUNITY): Payer: BC Managed Care – PPO

## 2023-08-26 DIAGNOSIS — M4722 Other spondylosis with radiculopathy, cervical region: Secondary | ICD-10-CM | POA: Diagnosis not present

## 2023-08-26 DIAGNOSIS — M25512 Pain in left shoulder: Secondary | ICD-10-CM

## 2023-08-26 DIAGNOSIS — R293 Abnormal posture: Secondary | ICD-10-CM

## 2023-08-26 DIAGNOSIS — M5412 Radiculopathy, cervical region: Secondary | ICD-10-CM | POA: Diagnosis not present

## 2023-08-26 NOTE — Therapy (Signed)
[facility] OUTPATIENT PHYSICAL THERAPY TREATMENT   Patient Name: Anthony Anderson MRN: [id number] DOB:[birth year], 62 y.o., male Today's Date: 08/26/2023  END OF SESSION:   PT End of Session - 08/26/23 1616     Visit Number 6    Number of Visits 12    Date for PT Re-Evaluation 09/09/23    Authorization Type [employer] PPO-no auth no limit    Authorization Time Period 07/29/23-09/09/23    Progress Note Due on Visit 10    PT Start Time 1600    PT Stop Time 1640    PT Time Calculation (min) 40 min    Activity Tolerance Patient tolerated treatment well;No increased pain;Patient limited by pain    Behavior During Therapy [facility] for tasks assessed/performed             Past Medical History:  Diagnosis Date   Cancer [facility])    prostate, currently watching for any changes   Cholelithiasis    Dyspnea 2015   Dysrhythmia    GERD (gastroesophageal reflux disease)    History of hiatal hernia    History of kidney stones    Hyperlipidemia    Hypertension    Kidney stones    PSA elevation    Sleep apnea    uses CPAP   Past Surgical History:  Procedure Laterality Date   CARDIAC CATHETERIZATION     COLONOSCOPY WITH PROPOFOL N/A 02/06/2022   Procedure: COLONOSCOPY WITH PROPOFOL;  Surgeon: Dolores Frame, MD;  [facility];  Service: Gastroenterology;  Laterality: N/A;  10:00   CYSTOSCOPY W/ URETERAL STENT REMOVAL Right 02/24/2020   Procedure: CYSTOSCOPY WITH STENT REMOVAL;  Surgeon: Marcine Matar, MD;  Location: [facility];  Service: Urology;  Laterality: Right;  1 HR   CYSTOSCOPY WITH RETROGRADE PYELOGRAM, URETEROSCOPY AND STENT PLACEMENT Right 02/24/2020   Procedure: CYSTOSCOPY WITH RETROGRADE PYELOGRAM, URETEROSCOPY AND STENT PLACEMENT;  Surgeon: Marcine Matar, MD;  [facility];  Service: Urology;  Laterality: Right;   CYSTOSCOPY WITH STENT PLACEMENT Right 02/07/2020   Procedure: CYSTOSCOPY RIGHT RETROGRADE PYLEOGRAM WITH RIGHT STENT PLACEMENT;  Surgeon: Noel Christmas, MD;  [facility];  Service: Urology;  Laterality: Right;   HOLMIUM LASER APPLICATION Right 02/24/2020   Procedure: HOLMIUM LASER APPLICATION;  Surgeon: Marcine Matar, MD;  [facility];  Service: Urology;  Laterality: Right;   LEFT HEART CATHETERIZATION WITH CORONARY ANGIOGRAM N/A 10/26/2014   Procedure: LEFT HEART CATHETERIZATION WITH CORONARY ANGIOGRAM;  Surgeon: Lesleigh Noe, MD;  Location: [facility] CATH LAB;  Service: Cardiovascular;  Laterality: N/A;   LITHOTRIPSY     MASS EXCISION Left 01/09/2018   Procedure: EXCISION SUBCUTANEOUS MASS, CHEST WALL;  Surgeon: Franky Macho, MD;  Location: [facility];  Service: General;  Laterality: Left;   NASAL SINUS SURGERY     PELVIC LYMPH NODE DISSECTION Bilateral 09/11/2018   Procedure: PELVIC LYMPH NODE DISSECTION;  Surgeon: Crist Fat, MD;  [facility];  Service: Urology;  Laterality: Bilateral;   POLYPECTOMY  02/06/2022   Procedure: POLYPECTOMY;  Surgeon: Dolores Frame, MD;  [facility];  Service: Gastroenterology;;   ROBOT ASSISTED LAPAROSCOPIC RADICAL PROSTATECTOMY N/A 09/11/2018   Procedure: XI ROBOTIC ASSISTED LAPAROSCOPIC RADICAL PROSTATECTOMY;  Surgeon: Crist Fat, MD;  [facility];  Service: Urology;  Laterality: N/A;   Patient Active Problem List   Diagnosis Date Noted   Radiculopathy due to cervical spondylosis at multiple levels 06/11/2023   Degeneration of lumbar intervertebral disc 05/04/2023  Class 2 obesity 04/14/2023   Chest pressure 04/14/2023   Acute sinusitis 10/29/2022   Chronic kidney disease, stage 4 (severe) (HCC) 06/28/2021   Ureteral calculus 02/07/2020   Prostate cancer (HCC) 09/11/2018   Lipoma of torso    Essential hypertension 10/26/2014   Hyperlipidemia 10/26/2014   GERD (gastroesophageal reflux disease) 10/26/2014   Chest pain    Shortness of breath 10/24/2014   Near syncope 10/24/2014   Dyspnea 10/24/2014    PCP: Benita Stabile, MDRef  Provider (PCP)   REFERRING PROVIDER: Oliver Barre, MD   REFERRING DIAG: [phone] (ICD-10-CM) - Radiculopathy due to cervical spondylosis at multiple levels   THERAPY DIAG:  Left shoulder pain, unspecified chronicity  Abnormal posture  Radiculopathy, cervical region  Rationale for Evaluation and Treatment: Rehabilitation  ONSET DATE: June 2024  SUBJECTIVE:                                                                                                                                                                                      SUBJECTIVE STATEMENT: Pain has been pretty good since last session. Saw ortho sine who does feel some radicular symptoms lingering still, has made some recommendations.     PERTINENT HISTORY: Patient was on vacation and felt left pain down the arm to the fingers. Patient was cleared for any stroke or TIA and MRI 03/2023 and cleared for heart attack 04/2023. Patient was sent to MD 05/2023 - 06/2023. Patient saw Dr Dallas Schimke 07/22/23 who referred to OPPT. PMH: HTN Hand dominance: Right   PAIN:  Are you having pain? Yes 1/10 Left scapular distribution  PRECAUTIONS: None  WEIGHT BEARING RESTRICTIONS: No  FALLS:  Has patient fallen in last 6 months? No  PATIENT GOALS: To reduce pain to 0%      TODAY'S TREATMENT:                                                                                                                                         DATE:   08/26/23:  -UBE level 3x4 minutes, alternating  Q60sec  Supine, neck on towel roll -wand flexion 1x15 bilat   -isometric shoulder ADD pillow squeeze 1x15x3secH  -BlueTB BUE ER 1x15  -Left shoulder scaption 0-90 degrees 1x15 @ 3lb weight  -isometric BUE GHJ IR 15x3secH   -isometric shoulder ADD pillow squeeze 1x15x3secH  -BlueTB BUE ER 1x15  -Left shoulder scaption 0-90 degrees 1x15 @ 3lb weight  -isometric BUE GHJ IR 15x3secH   -standing lateral raise LUE to 90 degrees 3lb 1x12 (increased  this date, feels decent)  -standing cable row 13 plates 9G29, cues for end-movement scap retraction  -seated LUE GHJ ER shoulder flexed to 65 degrees, hand starting in front of sternum, ending in front of left orbit 1x15 @ 4lb  -standing wall pushup off treadmill bar 1x12 (feet ~18 inches away) pain free range  -standing lateral raise LUE to 90 degrees 3lb 1x12  -standing cable row 13 plates 5M84, cues for end-movement scap retraction (slight downward row)  -seated LUE GHJ ER shoulder flexed to 65 degrees, hand starting in front of sternum, ending in front of left orbit 1x12 @ 5lb  -seated LUE ER from 65 degrees ABDCT 1x8 @ 5lb  -standing wall pushup off treadmill bar 1x12 (feet ~18 inches away)   PATIENT EDUCATION: Education details: HEP adjustments, role of pec minor shortening in thoracic outlet syndrome Person educated: Patient Education method: Explanation Education comprehension: verbalized understanding  HOME EXERCISE PROGRAM: Access Code: [email or web address] URL: [email or web address] Date: 08/15/2023 Prepared by: Alvera Novel  Exercises - Isometric Shoulder Adduction  - 3 x daily - 7 x weekly - 1 sets - 15 reps - 5 sec hold - Shoulder external rotation, scapular retraction RED band  - 3 x daily - 7 x weekly - 1 sets - 15 reps - 5sech hold  ASSESSMENT:  CLINICAL IMPRESSION: Left shoulder still progressively doing better with progressive load, even tolerating loaded rotation in more provocative joint positions. Discussed trying to wrap up shoulder rehab and update HEP for DC in 2-3 visits so that we can fully transition to radicular neck therapy. Pt is agreeable. The patient will benefit from PT to address the limitations/impairments listed below to return to their prior level of function in the domains of activity and participation.   PERSONAL FACTORS:  n/a  are also affecting patient's functional outcome.   REHAB POTENTIAL: Excellent  CLINICAL DECISION MAKING:  Stable/uncomplicated  EVALUATION COMPLEXITY: Low  GOALS: Goals reviewed with patient? No  SHORT TERM GOALS: Target date: 08/19/2023   Patient will be independent with a basic stretching/strengthening HEP  Baseline: Goal status: INITIAL   2.  Patient will be able to lift an object of 10lbs from a hip height off of a stable surface with 1-2/10 pain to facilitate ADL completion and self-care  Baseline:  Goal status: INITIAL   LONG TERM GOALS: Target date: 09/09/2023   Patient will be independent with a comprehensive strengthening HEP  Baseline:  Goal status: INITIAL  2.   Patient will improve  pain scale  score to 0/10 to facilitate pain-free ADL completion, lifting/carrying objects, reaching, and self-care  Baseline:  Goal status: INITIAL  3.  Patient will be able to lift an object of 10lbs overhead with 0/10 pain to facilitate ADL completion and self-care  Baseline:  Goal status: INITIAL  4.  Patient will demonstrate Left GHJ ER >70 degrees at >80 degrees of abduction to improve pain free performance of ADL, work duties  Baseline:  Goal status: NEW 08/15/23    PLAN: PT FREQUENCY:  1-2x/week  PT DURATION: 6 weeks  PLANNED INTERVENTIONS: Therapeutic exercises, Therapeutic activity, Neuromuscular re-education, Balance training, Gait training, Patient/Family education, Self Care, Joint mobilization, Joint manipulation, Dry Needling, Spinal manipulation, Spinal mobilization, Cryotherapy, Moist heat, and Manual therapy  PLAN FOR NEXT SESSION:   Repeat loading, can transition to full standing program, maintain interventions in pain free range to avoid impingement  Teyonna Plaisted C, PT 08/26/2023, 4:18 PM   Rosamaria Lints, PT Physical Therapist [facility] [place]  [phone]

## 2023-08-28 ENCOUNTER — Ambulatory Visit (HOSPITAL_COMMUNITY): Payer: BC Managed Care – PPO

## 2023-08-28 DIAGNOSIS — M25512 Pain in left shoulder: Secondary | ICD-10-CM

## 2023-08-28 DIAGNOSIS — M4722 Other spondylosis with radiculopathy, cervical region: Secondary | ICD-10-CM | POA: Diagnosis not present

## 2023-08-28 DIAGNOSIS — R293 Abnormal posture: Secondary | ICD-10-CM

## 2023-08-28 DIAGNOSIS — M5412 Radiculopathy, cervical region: Secondary | ICD-10-CM | POA: Diagnosis not present

## 2023-08-28 NOTE — Therapy (Signed)
[facility] OUTPATIENT PHYSICAL THERAPY TREATMENT   Patient Name: Anthony Anderson MRN: [id number] DOB:[birth year], 62 y.o., male Today's Date: 08/28/2023  END OF SESSION:   PT End of Session - 08/28/23 1600     Visit Number 7    Number of Visits 12    Date for PT Re-Evaluation 09/09/23    Authorization Type [employer] Comm PPO-no auth no limit    Authorization Time Period 07/29/23-09/09/23    PT Start Time 1600    PT Stop Time 1640    PT Time Calculation (min) 40 min    Activity Tolerance Patient tolerated treatment well;No increased pain;Patient limited by pain    Behavior During Therapy [facility] for tasks assessed/performed             Past Medical History:  Diagnosis Date   Cancer [facility])    prostate, currently watching for any changes   Cholelithiasis    Dyspnea 2015   Dysrhythmia    GERD (gastroesophageal reflux disease)    History of hiatal hernia    History of kidney stones    Hyperlipidemia    Hypertension    Kidney stones    PSA elevation    Sleep apnea    uses CPAP   Past Surgical History:  Procedure Laterality Date   CARDIAC CATHETERIZATION     COLONOSCOPY WITH PROPOFOL N/A 02/06/2022   Procedure: COLONOSCOPY WITH PROPOFOL;  Surgeon: Dolores Frame, MD;  [facility];  Service: Gastroenterology;  Laterality: N/A;  10:00   CYSTOSCOPY W/ URETERAL STENT REMOVAL Right 02/24/2020   Procedure: CYSTOSCOPY WITH STENT REMOVAL;  Surgeon: Marcine Matar, MD;  [facility];  Service: Urology;  Laterality: Right;  1 HR   CYSTOSCOPY WITH RETROGRADE PYELOGRAM, URETEROSCOPY AND STENT PLACEMENT Right 02/24/2020   Procedure: CYSTOSCOPY WITH RETROGRADE PYELOGRAM, URETEROSCOPY AND STENT PLACEMENT;  Surgeon: Marcine Matar, MD;  [facility];  Service: Urology;  Laterality: Right;   CYSTOSCOPY WITH STENT PLACEMENT Right 02/07/2020   Procedure: CYSTOSCOPY RIGHT RETROGRADE PYLEOGRAM WITH RIGHT STENT PLACEMENT;  Surgeon: Noel Christmas, MD;  [facility];   Service: Urology;  Laterality: Right;   HOLMIUM LASER APPLICATION Right 02/24/2020   Procedure: HOLMIUM LASER APPLICATION;  Surgeon: Marcine Matar, MD;  [facility];  Service: Urology;  Laterality: Right;   LEFT HEART CATHETERIZATION WITH CORONARY ANGIOGRAM N/A 10/26/2014   Procedure: LEFT HEART CATHETERIZATION WITH CORONARY ANGIOGRAM;  Surgeon: Lesleigh Noe, MD;  Location: [facility] CATH LAB;  Service: Cardiovascular;  Laterality: N/A;   LITHOTRIPSY     MASS EXCISION Left 01/09/2018   Procedure: EXCISION SUBCUTANEOUS MASS, CHEST WALL;  Surgeon: Franky Macho, MD;  Location: [facility];  Service: General;  Laterality: Left;   NASAL SINUS SURGERY     PELVIC LYMPH NODE DISSECTION Bilateral 09/11/2018   Procedure: PELVIC LYMPH NODE DISSECTION;  Surgeon: Crist Fat, MD;  [facility];  Service: Urology;  Laterality: Bilateral;   POLYPECTOMY  02/06/2022   Procedure: POLYPECTOMY;  Surgeon: Dolores Frame, MD;  [facility];  Service: Gastroenterology;;   ROBOT ASSISTED LAPAROSCOPIC RADICAL PROSTATECTOMY N/A 09/11/2018   Procedure: XI ROBOTIC ASSISTED LAPAROSCOPIC RADICAL PROSTATECTOMY;  Surgeon: Crist Fat, MD;  [facility];  Service: Urology;  Laterality: N/A;   Patient Active Problem List   Diagnosis Date Noted   Radiculopathy due to cervical spondylosis at multiple levels 06/11/2023   Degeneration of lumbar intervertebral disc 05/04/2023   Class 2 obesity 04/14/2023   Chest  pressure 04/14/2023   Acute sinusitis 10/29/2022   Chronic kidney disease, stage 4 (severe) (HCC) 06/28/2021   Ureteral calculus 02/07/2020   Prostate cancer (HCC) 09/11/2018   Lipoma of torso    Essential hypertension 10/26/2014   Hyperlipidemia 10/26/2014   GERD (gastroesophageal reflux disease) 10/26/2014   Chest pain    Shortness of breath 10/24/2014   Near syncope 10/24/2014   Dyspnea 10/24/2014    PCP: Benita Stabile, MDRef Provider (PCP)   REFERRING  PROVIDER: Oliver Barre, MD   REFERRING DIAG: [phone] (ICD-10-CM) - Radiculopathy due to cervical spondylosis at multiple levels   THERAPY DIAG:  Left shoulder pain, unspecified chronicity  Abnormal posture  Radiculopathy, cervical region  Rationale for Evaluation and Treatment: Rehabilitation  ONSET DATE: June 2024  SUBJECTIVE:                                                                                                                                                                                      SUBJECTIVE STATEMENT: Patient continues to feel well-controlled patient reports intermittent discomfort in the left anterior shoulder with activities and use of hands but these are few and far between.  Patient still endorses occasional shooting nerve phenomenon from clavicle through the anterior deltoid and into the mid arm periodically which he attributes to his neck.  He also endorses persistent central lower neck pain that is stable but always there.  He feels overall improvements with all of his symptoms over the last couple of sessions but the neck pain has persisted despite near resolution of shoulder pain.     PERTINENT HISTORY: Patient was on vacation and felt left pain down the arm to the fingers. Patient was cleared for any stroke or TIA and MRI 03/2023 and cleared for heart attack 04/2023. Patient was sent to MD 05/2023 - 06/2023. Patient saw Dr Dallas Schimke 07/22/23 who referred to OPPT. PMH: HTN Hand dominance: Right   PAIN:  Are you having pain? Yes 1/10 Left scapular distribution  PRECAUTIONS: None  WEIGHT BEARING RESTRICTIONS: No  FALLS:  Has patient fallen in last 6 months? No  PATIENT GOALS: To reduce pain to 0%      TODAY'S TREATMENT:  DATE:   08/28/23 -UBE level 3x 4 minutes  - Short level shoulder flexion (hand to pony tail)    - 2-3 x weekly - 2-3 sets - 12 reps - 3lb weight - Single Arm Bent Over Shoulder Horizontal Abduction with Dumbbell - Thumb Up  - 2-3 x weekly - 2-3 sets - 12 reps - 2lb weight:  - Standing Single Arm Shoulder Abduction with Dumbbell - Palm Down  - 2-3 x weekly - 2-3 sets - 12 reps - 3lb weight:  - Seated Shoulder External Rotation in Abduction Supported with Dumbbell  - 2-3 x weekly - 2-3 sets - 12-15 reps - 5lb  weight:  - Standing row with BLACK band  - 2-3 x weekly - 2-3 sets - 10 reps - 15 hold - Push Up on Table  - 2-3 x weekly - 2-3 sets - 10 reps - 15 hold  08/26/23:  -UBE level 3x4 minutes, alternating Q60sec  Supine, neck on towel roll -wand flexion 1x15 bilat   -isometric shoulder ADD pillow squeeze 1x15x3secH  -BlueTB BUE ER 1x15  -Left shoulder scaption 0-90 degrees 1x15 @ 3lb weight  -isometric BUE GHJ IR 15x3secH   -isometric shoulder ADD pillow squeeze 1x15x3secH  -BlueTB BUE ER 1x15  -Left shoulder scaption 0-90 degrees 1x15 @ 3lb weight  -isometric BUE GHJ IR 15x3secH   -standing lateral raise LUE to 90 degrees 3lb 1x12 (increased this date, feels decent)  -standing cable row 13 plates 2Z30, cues for end-movement scap retraction  -seated LUE GHJ ER shoulder flexed to 65 degrees, hand starting in front of sternum, ending in front of left orbit 1x15 @ 4lb  -standing wall pushup off treadmill bar 1x12 (feet ~18 inches away) pain free range  -standing lateral raise LUE to 90 degrees 3lb 1x12  -standing cable row 13 plates 8M57, cues for end-movement scap retraction (slight downward row)  -seated LUE GHJ ER shoulder flexed to 65 degrees, hand starting in front of sternum, ending in front of left orbit 1x12 @ 5lb  -seated LUE ER from 65 degrees ABDCT 1x8 @ 5lb  -standing wall pushup off treadmill bar 1x12 (feet ~18 inches away)   PATIENT EDUCATION: Education details: HEP adjustments, self progression Person educated: Patient Education method: Explanation Education  comprehension: verbalized understanding  HOME EXERCISE PROGRAM: Access Code: [email or web address] URL: [email or web address] Date: 08/28/2023 Prepared by: Alvera Novel  Exercises - Short level shoulder flexion (hand to pony tail)   - 2-3 x weekly - 2-3 sets - 12 reps - 3lb weight - Single Arm Bent Over Shoulder Horizontal Abduction with Dumbbell - Thumb Up  - 2-3 x weekly - 2-3 sets - 12 reps - 2lb weight:  - Standing Single Arm Shoulder Abduction with Dumbbell - Palm Down  - 2-3 x weekly - 2-3 sets - 12 reps - 3lb weight:  - Seated Shoulder External Rotation in Abduction Supported with Dumbbell  - 2-3 x weekly - 2-3 sets - 12-15 reps - 5lb  weight:  - Standing row with GREEN band  - 2-3 x weekly - 2-3 sets - 10 reps - 15 hold - Push Up on Table  - 2-3 x weekly - 2-3 sets - 10 reps - 15 hold  ASSESSMENT:  CLINICAL IMPRESSION: Patient is transitioned to a final HEP for left shoulder impingement, all exercises transitioned to free weight resistance and upright postures.  Able to perform entire program today at prescribed resistance and intensities without any limiting pain or other symptoms exacerbation.  This is the fourth consecutive session the patient has been able to increase load to his shoulder while simultaneously experiencing a decrease in shoulder pain.  Patient is agreeable to continue on for his isolated cervical radicular symptoms, we will plan on scheduling for 6 more visits over his remaining cert, patient is agreeable to this.  The patient will benefit from PT to address the limitations/impairments listed below to return to their prior level of function in the domains of activity and participation.   PERSONAL FACTORS:  n/a  are also affecting patient's functional outcome.   REHAB POTENTIAL: Excellent  CLINICAL DECISION MAKING: Stable/uncomplicated  EVALUATION COMPLEXITY: Low  GOALS: Goals reviewed with patient? No  SHORT TERM GOALS: Target date: 08/19/2023   Patient  will be independent with a basic stretching/strengthening HEP  Baseline: Goal status: Achieved   2.  Patient will be able to lift an object of 10lbs from a hip height off of a stable surface with 1-2/10 pain to facilitate ADL completion and self-care  Baseline:  Goal status: Ongoing   LONG TERM GOALS: Target date: 09/09/2023   Patient will be independent with a comprehensive strengthening HEP  Baseline:  Goal status: Achieved 08/28/2023  2.   Patient will improve  pain scale  score to 0/10 to facilitate pain-free ADL completion, lifting/carrying objects, reaching, and self-care  Baseline:  Goal status: Achieved 08/28/2023  3.  Patient will be able to lift an object of 10lbs overhead with 0/10 pain to facilitate ADL completion and self-care  Baseline:  Goal status: Ongoing 08/28/2023  4.  Patient will demonstrate Left GHJ ER >70 degrees at >80 degrees of abduction to improve pain free performance of ADL, work duties  Baseline:  Goal status: NEW 08/15/23 with    PLAN: PT FREQUENCY: 1-2x/week  PT DURATION: 6 weeks  PLANNED INTERVENTIONS: Therapeutic exercises, Therapeutic activity, Neuromuscular re-education, Balance training, Gait training, Patient/Family education, Self Care, Joint mobilization, Joint manipulation, Dry Needling, Spinal manipulation, Spinal mobilization, Cryotherapy, Moist heat, and Manual therapy  PLAN FOR NEXT SESSION:   Transition back to room to cervical radiculopathy and neck pain treatments, patient has now been discharged to an independent program for his shoulder we will continue current medical issue.  Corey Caulfield C, PT 08/28/2023, 4:44 PM   Rosamaria Lints, PT Physical Therapist [facility] [place]  [phone]

## 2023-09-04 ENCOUNTER — Ambulatory Visit: Payer: BC Managed Care – PPO | Admitting: Physical Medicine and Rehabilitation

## 2023-09-04 ENCOUNTER — Other Ambulatory Visit: Payer: Self-pay

## 2023-09-04 DIAGNOSIS — M5412 Radiculopathy, cervical region: Secondary | ICD-10-CM

## 2023-09-04 MED ORDER — METHYLPREDNISOLONE ACETATE 40 MG/ML IJ SUSP
40.0000 mg | Freq: Once | INTRAMUSCULAR | Status: AC
Start: 2023-09-04 — End: 2023-09-04
  Administered 2023-09-04: 40 mg

## 2023-09-04 NOTE — Patient Instructions (Signed)

## 2023-09-04 NOTE — Progress Notes (Signed)
Functional Pain Scale - descriptive words and definitions  Uncomfortable (3)  Pain is present but can complete all ADL's/sleep is slightly affected and passive distraction only gives marginal relief. Mild range order  Average Pain 3  131/87 +Driver, -BT, -Dye Allergies.

## 2023-09-07 NOTE — Procedures (Signed)
Cervical Epidural Steroid Injection - Interlaminar Approach with Fluoroscopic Guidance  Patient: Anthony Anderson      Date of Birth: 1961-08-31 MRN: [id number] PCP: Benita Stabile, MD      Visit Date: 09/04/2023   Universal Protocol:    Date/Time: 11/10/242:12 PM  Consent Given By: the patient  Position: PRONE  Additional Comments: Vital signs were monitored before and after the procedure. Patient was prepped and draped in the usual sterile fashion. The correct patient, procedure, and site was verified.   Injection Procedure Details:   Procedure diagnoses: Cervical radiculopathy [M54.12]    Meds Administered:  Meds ordered this encounter  Medications   methylPREDNISolone acetate (DEPO-MEDROL) injection 40 mg     Laterality: Left  Location/Site: C7-T1  Needle: 3.5 in., 20 ga. Tuohy  Needle Placement: Paramedian epidural space  Findings:  -Comments: Excellent flow of contrast into the epidural space.  Procedure Details: Using a paramedian approach from the side mentioned above, the region overlying the inferior lamina was localized under fluoroscopic visualization and the soft tissues overlying this structure were infiltrated with 4 ml. of 1% Lidocaine without Epinephrine. A # 20 gauge, Tuohy needle was inserted into the epidural space using a paramedian approach.  The epidural space was localized using loss of resistance along with contralateral oblique bi-planar fluoroscopic views.  After negative aspirate for air, blood, and CSF, a 2 ml. volume of Isovue-250 was injected into the epidural space and the flow of contrast was observed. Radiographs were obtained for documentation purposes.   The injectate was administered into the level noted above.  Additional Comments:  No complications occurred Dressing: 2 x 2 sterile gauze and Band-Aid    Post-procedure details: Patient was observed during the procedure. Post-procedure instructions were reviewed.  Patient left  the clinic in stable condition.

## 2023-09-07 NOTE — Progress Notes (Signed)
ANDYN PETRU - 62 y.o. male MRN [id number]  Date of birth: [birth year]  Office Visit Note: Visit Date: 09/04/2023 PCP: Benita Stabile, MD Referred by: London Sheer, MD  Subjective: Chief Complaint  Patient presents with   Neck - Pain   HPI:  KAWHI BLAKEMAN is a 62 y.o. male who comes in today at the request of Dr. Willia Craze for planned Left C7-T1 Cervical Interlaminar epidural steroid injection with fluoroscopic guidance.  The patient has failed conservative care including home exercise, medications, time and activity modification.  This injection will be diagnostic and hopefully therapeutic.  Please see requesting physician notes for further details and justification.   ROS Otherwise per HPI.  Assessment & Plan: Visit Diagnoses:    ICD-10-CM   1. Cervical radiculopathy  M54.12 XR C-ARM NO REPORT    Epidural Steroid injection    methylPREDNISolone acetate (DEPO-MEDROL) injection 40 mg      Plan: No additional findings.   Meds & Orders:  Meds ordered this encounter  Medications   methylPREDNISolone acetate (DEPO-MEDROL) injection 40 mg    Orders Placed This Encounter  Procedures   XR C-ARM NO REPORT   Epidural Steroid injection    Follow-up: Return for visit to requesting provider as needed.   Procedures: No procedures performed  Cervical Epidural Steroid Injection - Interlaminar Approach with Fluoroscopic Guidance  Patient: MADDIX LEBEN      Date of Birth: [birth year] MRN: [id number] PCP: Benita Stabile, MD      Visit Date: 09/04/2023   Universal Protocol:    Date/Time: 11/10/242:12 PM  Consent Given By: the patient  Position: PRONE  Additional Comments: Vital signs were monitored before and after the procedure. Patient was prepped and draped in the usual sterile fashion. The correct patient, procedure, and site was verified.   Injection Procedure Details:   Procedure diagnoses: Cervical radiculopathy [M54.12]    Meds Administered:  Meds  ordered this encounter  Medications   methylPREDNISolone acetate (DEPO-MEDROL) injection 40 mg     Laterality: Left  Location/Site: C7-T1  Needle: 3.5 in., 20 ga. Tuohy  Needle Placement: Paramedian epidural space  Findings:  -Comments: Excellent flow of contrast into the epidural space.  Procedure Details: Using a paramedian approach from the side mentioned above, the region overlying the inferior lamina was localized under fluoroscopic visualization and the soft tissues overlying this structure were infiltrated with 4 ml. of 1% Lidocaine without Epinephrine. A # 20 gauge, Tuohy needle was inserted into the epidural space using a paramedian approach.  The epidural space was localized using loss of resistance along with contralateral oblique bi-planar fluoroscopic views.  After negative aspirate for air, blood, and CSF, a 2 ml. volume of Isovue-250 was injected into the epidural space and the flow of contrast was observed. Radiographs were obtained for documentation purposes.   The injectate was administered into the level noted above.  Additional Comments:  No complications occurred Dressing: 2 x 2 sterile gauze and Band-Aid    Post-procedure details: Patient was observed during the procedure. Post-procedure instructions were reviewed.  Patient left the clinic in stable condition.   Clinical History: CLINICAL DATA:  Neck and left arm pain.   EXAM: MRI CERVICAL SPINE WITHOUT CONTRAST   TECHNIQUE: Multiplanar, multisequence MR imaging of the cervical spine was performed. No intravenous contrast was administered.   COMPARISON:  None Available.   FINDINGS: Alignment: Degenerative posterior subluxation of C3. The other cervical vertebral bodies are normally aligned.  Vertebrae: Normal marrow signal.  No bone lesions or fractures.   Cord: Normal cord signal intensity.  No cord lesions or syrinx.   Posterior Fossa, vertebral arteries, paraspinal tissues:  No significant findings.   Disc levels:   C2-3: No significant findings.   C3-4: Moderate degenerative disc disease with a broad-based disc osteophyte complex with significant flattening of the ventral thecal sac and narrowing of the ventral CSF space. Uncinate spurring changes contributing to bilateral foraminal stenosis which is moderate.   C4-5: Tiny central disc protrusion without neural compression. No spinal or foraminal stenosis.   C5-6: Right-sided disc osteophyte complex with significant right foraminal stenosis likely affecting the right C6 nerve root. No spinal or left foraminal stenosis.   C6-7: No significant findings.   C7-T1: No significant findings.   IMPRESSION: 1. Moderate degenerative disc disease at C3-4 with spinal and foraminal stenosis as detailed above. 2. Right-sided disc osteophyte complex at C5-6 with significant right foraminal stenosis likely affecting the right C6 nerve root.     Electronically Signed   By: Rudie Meyer M.D.   On: 05/01/2023 14:13     Objective:  VS:  HT:    WT:   BMI:     BP:   HR: bpm  TEMP: ( )  RESP:  Physical Exam Vitals and nursing note reviewed.  Constitutional:      General: He is not in acute distress.    Appearance: Normal appearance. He is not ill-appearing.  HENT:     Head: Normocephalic and atraumatic.     Right Ear: External ear normal.     Left Ear: External ear normal.  Eyes:     Extraocular Movements: Extraocular movements intact.  Cardiovascular:     Rate and Rhythm: Normal rate.     Pulses: Normal pulses.  Abdominal:     General: There is no distension.     Palpations: Abdomen is soft.  Musculoskeletal:        General: No signs of injury.     Cervical back: Neck supple. Tenderness present. No rigidity.     Right lower leg: No edema.     Left lower leg: No edema.     Comments: Patient has good strength in the upper extremities with 5 out of 5 strength in wrist extension long finger  flexion APB.  No intrinsic hand muscle atrophy.  Negative Hoffmann's test.  Lymphadenopathy:     Cervical: No cervical adenopathy.  Skin:    Findings: No erythema or rash.  Neurological:     General: No focal deficit present.     Mental Status: He is alert and oriented to person, place, and time.     Sensory: No sensory deficit.     Motor: No weakness or abnormal muscle tone.     Coordination: Coordination normal.  Psychiatric:        Mood and Affect: Mood normal.        Behavior: Behavior normal.      Imaging: No results found.

## 2023-09-11 ENCOUNTER — Ambulatory Visit (HOSPITAL_COMMUNITY): Payer: BC Managed Care – PPO | Attending: Orthopedic Surgery

## 2023-09-11 DIAGNOSIS — R293 Abnormal posture: Secondary | ICD-10-CM

## 2023-09-11 DIAGNOSIS — M25512 Pain in left shoulder: Secondary | ICD-10-CM | POA: Diagnosis not present

## 2023-09-11 DIAGNOSIS — M5412 Radiculopathy, cervical region: Secondary | ICD-10-CM

## 2023-09-11 NOTE — Therapy (Signed)
OUTPATIENT PHYSICAL THERAPY TREATMENT   Patient Name: Anthony Anderson MRN: [id number] DOB:1961/03/19, 62 y.o., male Today's Date: 09/11/2023    PHYSICAL THERAPY DISCHARGE SUMMARY  Visits from Start of Care: 8  Current functional level related to goals / functional outcomes: N/a   Remaining deficits: N/a   Education / Equipment: N/a   Patient agrees to discharge. Patient goals were met. Patient is being discharged due to meeting the stated rehab goals.   END OF SESSION:   PT End of Session - 09/11/23 1514     Visit Number 8    Number of Visits 12    Date for PT Re-Evaluation 09/09/23    Authorization Type [employer] PPO-no auth no limit    Authorization Time Period 07/29/23-09/09/23    PT Start Time 0315    PT Stop Time 0355    PT Time Calculation (min) 40 min    Activity Tolerance Patient tolerated treatment well;No increased pain;Patient limited by pain    Behavior During Therapy [facility] for tasks assessed/performed             Past Medical History:  Diagnosis Date   Cancer [facility])    prostate, currently watching for any changes   Cholelithiasis    Dyspnea 2015   Dysrhythmia    GERD (gastroesophageal reflux disease)    History of hiatal hernia    History of kidney stones    Hyperlipidemia    Hypertension    Kidney stones    PSA elevation    Sleep apnea    uses CPAP   Past Surgical History:  Procedure Laterality Date   CARDIAC CATHETERIZATION     COLONOSCOPY WITH PROPOFOL N/A 02/06/2022   Procedure: COLONOSCOPY WITH PROPOFOL;  Surgeon: Dolores Frame, MD;  Location: [facility];  Service: Gastroenterology;  Laterality: N/A;  10:00   CYSTOSCOPY W/ URETERAL STENT REMOVAL Right 02/24/2020   Procedure: CYSTOSCOPY WITH STENT REMOVAL;  Surgeon: Marcine Matar, MD;  Location: [facility];  Service: Urology;  Laterality: Right;  1 HR   CYSTOSCOPY WITH RETROGRADE PYELOGRAM, URETEROSCOPY AND STENT PLACEMENT Right 02/24/2020   Procedure: CYSTOSCOPY WITH  RETROGRADE PYELOGRAM, URETEROSCOPY AND STENT PLACEMENT;  Surgeon: Marcine Matar, MD;  Location: [facility];  Service: Urology;  Laterality: Right;   CYSTOSCOPY WITH STENT PLACEMENT Right 02/07/2020   Procedure: CYSTOSCOPY RIGHT RETROGRADE PYLEOGRAM WITH RIGHT STENT PLACEMENT;  Surgeon: Noel Christmas, MD;  Location: [facility];  Service: Urology;  Laterality: Right;   HOLMIUM LASER APPLICATION Right 02/24/2020   Procedure: HOLMIUM LASER APPLICATION;  Surgeon: Marcine Matar, MD;  Location: [facility];  Service: Urology;  Laterality: Right;   LEFT HEART CATHETERIZATION WITH CORONARY ANGIOGRAM N/A 10/26/2014   Procedure: LEFT HEART CATHETERIZATION WITH CORONARY ANGIOGRAM;  Surgeon: Lesleigh Noe, MD;  Location: [facility] CATH LAB;  Service: Cardiovascular;  Laterality: N/A;   LITHOTRIPSY     MASS EXCISION Left 01/09/2018   Procedure: EXCISION SUBCUTANEOUS MASS, CHEST WALL;  Surgeon: Franky Macho, MD;  Location: [facility];  Service: General;  Laterality: Left;   NASAL SINUS SURGERY     PELVIC LYMPH NODE DISSECTION Bilateral 09/11/2018   Procedure: PELVIC LYMPH NODE DISSECTION;  Surgeon: Crist Fat, MD;  Location: [facility];  Service: Urology;  Laterality: Bilateral;   POLYPECTOMY  02/06/2022   Procedure: POLYPECTOMY;  Surgeon: Dolores Frame, MD;  Location: [facility];  Service: Gastroenterology;;   ROBOT ASSISTED LAPAROSCOPIC RADICAL PROSTATECTOMY N/A 09/11/2018   Procedure: XI ROBOTIC ASSISTED LAPAROSCOPIC  RADICAL PROSTATECTOMY;  Surgeon: Crist Fat, MD;  Location: [facility];  Service: Urology;  Laterality: N/A;   Patient Active Problem List   Diagnosis Date Noted   Radiculopathy due to cervical spondylosis at multiple levels 06/11/2023   Degeneration of lumbar intervertebral disc 05/04/2023   Class 2 obesity 04/14/2023   Chest pressure 04/14/2023   Acute sinusitis 10/29/2022   Chronic kidney disease, stage 4 (severe) (HCC) 06/28/2021   Ureteral calculus 02/07/2020    Prostate cancer (HCC) 09/11/2018   Lipoma of torso    Essential hypertension 10/26/2014   Hyperlipidemia 10/26/2014   GERD (gastroesophageal reflux disease) 10/26/2014   Chest pain    Shortness of breath 10/24/2014   Near syncope 10/24/2014   Dyspnea 10/24/2014    PCP: Benita Stabile, MDRef Provider (PCP)   REFERRING PROVIDER: Oliver Barre, MD   REFERRING DIAG: [phone] (ICD-10-CM) - Radiculopathy due to cervical spondylosis at multiple levels   THERAPY DIAG:  No diagnosis found.  Rationale for Evaluation and Treatment: Rehabilitation  ONSET DATE: June 2024  SUBJECTIVE:                                                                                                                                                                                      SUBJECTIVE STATEMENT: Patient states he had recent MD appointment who performed cortisone shot to neck; patient has had decreased pain levels since the shot; 0-10 pain today. Patient is happy with current progress; will return to MD in December 2024    PERTINENT HISTORY: Patient was on vacation and felt left pain down the arm to the fingers. Patient was cleared for any stroke or TIA and MRI 03/2023 and cleared for heart attack 04/2023. Patient was sent to MD 05/2023 - 06/2023. Patient saw Dr Dallas Schimke 07/22/23 who referred to OPPT. PMH: HTN Hand dominance: Right   PAIN:  Are you having pain? Yes 1/10 Left scapular distribution  PRECAUTIONS: None  WEIGHT BEARING RESTRICTIONS: No  FALLS:  Has patient fallen in last 6 months? No  PATIENT GOALS: To reduce pain to 0%      TODAY'S TREATMENT:  DATE:  09/11/23 PT discharge & objective testing  HEP review  08/28/23 -UBE level 3x 4 minutes  - Short level shoulder flexion (hand to pony tail)   - 2-3 x weekly - 2-3 sets - 12 reps - 3lb weight - Single  Arm Bent Over Shoulder Horizontal Abduction with Dumbbell - Thumb Up  - 2-3 x weekly - 2-3 sets - 12 reps - 2lb weight:  - Standing Single Arm Shoulder Abduction with Dumbbell - Palm Down  - 2-3 x weekly - 2-3 sets - 12 reps - 3lb weight:  - Seated Shoulder External Rotation in Abduction Supported with Dumbbell  - 2-3 x weekly - 2-3 sets - 12-15 reps - 5lb  weight:  - Standing row with BLACK band  - 2-3 x weekly - 2-3 sets - 10 reps - 15 hold - Push Up on Table  - 2-3 x weekly - 2-3 sets - 10 reps - 15 hold  08/26/23:  -UBE level 3x4 minutes, alternating Q60sec  Supine, neck on towel roll -wand flexion 1x15 bilat   -isometric shoulder ADD pillow squeeze 1x15x3secH  -BlueTB BUE ER 1x15  -Left shoulder scaption 0-90 degrees 1x15 @ 3lb weight  -isometric BUE GHJ IR 15x3secH   -isometric shoulder ADD pillow squeeze 1x15x3secH  -BlueTB BUE ER 1x15  -Left shoulder scaption 0-90 degrees 1x15 @ 3lb weight  -isometric BUE GHJ IR 15x3secH   -standing lateral raise LUE to 90 degrees 3lb 1x12 (increased this date, feels decent)  -standing cable row 13 plates 1O10, cues for end-movement scap retraction  -seated LUE GHJ ER shoulder flexed to 65 degrees, hand starting in front of sternum, ending in front of left orbit 1x15 @ 4lb  -standing wall pushup off treadmill bar 1x12 (feet ~18 inches away) pain free range  -standing lateral raise LUE to 90 degrees 3lb 1x12  -standing cable row 13 plates 9U04, cues for end-movement scap retraction (slight downward row)  -seated LUE GHJ ER shoulder flexed to 65 degrees, hand starting in front of sternum, ending in front of left orbit 1x12 @ 5lb  -seated LUE ER from 65 degrees ABDCT 1x8 @ 5lb  -standing wall pushup off treadmill bar 1x12 (feet ~18 inches away)   PATIENT EDUCATION: Education details: HEP adjustments, self progression Person educated: Patient Education method: Explanation Education comprehension: verbalized understanding  HOME EXERCISE  PROGRAM: Access Code: [email or web address] URL: [email or web address] Date: 08/28/2023 Prepared by: Alvera Novel  Exercises - Short level shoulder flexion (hand to pony tail)   - 2-3 x weekly - 2-3 sets - 12 reps - 3lb weight - Single Arm Bent Over Shoulder Horizontal Abduction with Dumbbell - Thumb Up  - 2-3 x weekly - 2-3 sets - 12 reps - 2lb weight:  - Standing Single Arm Shoulder Abduction with Dumbbell - Palm Down  - 2-3 x weekly - 2-3 sets - 12 reps - 3lb weight:  - Seated Shoulder External Rotation in Abduction Supported with Dumbbell  - 2-3 x weekly - 2-3 sets - 12-15 reps - 5lb  weight:  - Standing row with GREEN band  - 2-3 x weekly - 2-3 sets - 10 reps - 15 hold - Push Up on Table  - 2-3 x weekly - 2-3 sets - 10 reps - 15 hold  ASSESSMENT:  CLINICAL IMPRESSION: Patient is transitioned to a final HEP for left shoulder impingement, all exercises transitioned to free weight resistance and upright postures.  Patient has shown overall improvements in pain levels, active range of  motion, strength. Patient has completed (8) visits since the initial evaluation and has met 6/6 stated rehab goal. At this moment, PT discharge patient to HEP due to meeting the stated rehab goals..   The patient will benefit from PT to address the limitations/impairments listed below to return to their prior level of function in the domains of activity and participation.   PERSONAL FACTORS:  n/a  are also affecting patient's functional outcome.   REHAB POTENTIAL: Excellent  CLINICAL DECISION MAKING: Stable/uncomplicated  EVALUATION COMPLEXITY: Low  GOALS: Goals reviewed with patient? No  SHORT TERM GOALS: Target date: 08/19/2023   Patient will be independent with a basic stretching/strengthening HEP  Baseline: Goal status: Achieved   2.  Patient will be able to lift an object of 10lbs from a hip height off of a stable surface with 1-2/10 pain to facilitate ADL completion and self-care   Baseline:  Goal status: goal met 09/11/23   LONG TERM GOALS: Target date: 09/09/2023   Patient will be independent with a comprehensive strengthening HEP  Baseline:  Goal status: Achieved 08/28/2023  2.   Patient will improve  pain scale  score to 0/10 to facilitate pain-free ADL completion, lifting/carrying objects, reaching, and self-care  Baseline:  Goal status: Achieved 08/28/2023  3.  Patient will be able to lift an object of 10lbs overhead with 0/10 pain to facilitate ADL completion and self-care  Baseline:  Goal status: Goal met 09/11/23  4.  Patient will demonstrate Left GHJ ER >70 degrees at >80 degrees of abduction to improve pain free performance of ADL, work duties  Baseline:  Goal status: goal met 09/11/23    PLAN: PT FREQUENCY: 1-2x/week  PT DURATION: 6 weeks  PLANNED INTERVENTIONS: Therapeutic exercises, Therapeutic activity, Neuromuscular re-education, Balance training, Gait training, Patient/Family education, Self Care, Joint mobilization, Joint manipulation, Dry Needling, Spinal manipulation, Spinal mobilization, Cryotherapy, Moist heat, and Manual therapy  PLAN FOR NEXT SESSION:   Transition back to room to cervical radiculopathy and neck pain treatments, patient has now been discharged to an independent program for his shoulder we will continue current medical issue.  Seymour Bars, PT 09/11/2023, 3:18 PM

## 2023-09-18 ENCOUNTER — Encounter (HOSPITAL_COMMUNITY): Payer: BC Managed Care – PPO

## 2023-09-19 DIAGNOSIS — G4733 Obstructive sleep apnea (adult) (pediatric): Secondary | ICD-10-CM | POA: Diagnosis not present

## 2023-10-06 ENCOUNTER — Ambulatory Visit (INDEPENDENT_AMBULATORY_CARE_PROVIDER_SITE_OTHER): Payer: BC Managed Care – PPO | Admitting: Orthopedic Surgery

## 2023-10-06 DIAGNOSIS — M5412 Radiculopathy, cervical region: Secondary | ICD-10-CM | POA: Diagnosis not present

## 2023-10-06 MED ORDER — PREGABALIN 75 MG PO CAPS: 75.0000 mg | ORAL_CAPSULE | Freq: Two times a day (BID) | ORAL | 2 refills | Status: DC

## 2023-10-06 NOTE — Progress Notes (Signed)
Orthopedic Spine Surgery Office Note   Assessment: Patient is a 62 y.o. male with neck pain that radiates into the left shoulder. Has foraminal stenosis at C3/4     Plan: -Patient has tried PT, tylenol, aleve, prednisone, lyrica, cervical ESI -Since he is getting good relief with lyrica, provided him with a new prescription today -Cervical ESI did not give him lasting relief, so would not try again his C4 radiculopathy -Told him I could prescribe a medrol dose pak for flares of pain -Patient should return to office in 6 weeks, x-rays at next visit: none     Patient expressed understanding of the plan and all questions were answered to the patient's satisfaction.    ___________________________________________________________________________     History:   Patient is a 62 y.o. male who presents today for follow up on his cervical spine.  Patient has now had a little over 5 months of neck pain that radiates in the left upper extremity.  He feels it going into the lateral shoulder and lateral arm to the level of the mid humerus.  He does not have any pain rating into the right upper extremity.  After our last visit, he got an injection with Dr. Alvester Morin.  He says that he got good relief but it only lasted for about 5 days.  He has been more happy with the Lyrica which she said provides him with enough relief that the pain is tolerable.  He has not noticed any other treatments that provide him with that level of relief.   Treatments tried:  PT, tylenol, aleve, prednisone, lyrica, cervical ESI    Physical Exam:   General: no acute distress, appears stated age Neurologic: alert, answering questions appropriately, following commands Respiratory: unlabored breathing on room air, symmetric chest rise Psychiatric: appropriate affect, normal cadence to speech     MSK (spine):   -Strength exam                                                   Left                  Right Grip strength                 5/5                  5/5 Interosseus                  5/5                  5/5 Wrist extension            5/5                  5/5 Wrist flexion                 5/5                  5/5 Elbow flexion                5/5                  5/5 Deltoid                          5/5  5/5   -Sensory exam                           Sensation intact to light touch in C5-T1 nerve distributions of bilateral upper extremities   -Brachioradialis DTR: 2/4 on the left, 2/4 on the right -Biceps DTR: 2/4 on the left, 2/4 on the right   -Spurling: negative bilaterally -Hoffman sign: negative bilaterally -Clonus: no beats bilaterally -Interosseous wasting: none seen -Grip and release test: negative -Romberg: negative -Gait: normal   Left shoulder exam: pain with external rotation past 80 degrees, negative jobe, negative belly press, no weakness with external rotation with arm at side Right shoulder exam: no pain through range of motion   Imaging: XRs of the cervical spine from 08/25/2023 were previously independently reviewed and interpreted, showing C3/4 disc height loss with anterior osteophyte formation. C5/6 disc height loss as well. No fracture or dislocation seen. No evidence of instability on flexion/extension.    MRI of the cervical spine from 04/24/2023 was previously independently reviewed and interpreted, showing bilateral foraminal stenosis at C3/4. Right sided foraminal stenosis at C5/6. No other stenosis seen. No T2 cord signal change seen.      Patient name: Anthony Anderson Patient MRN: [id number] Date of visit: 10/06/23

## 2023-10-16 DIAGNOSIS — D225 Melanocytic nevi of trunk: Secondary | ICD-10-CM | POA: Diagnosis not present

## 2023-10-16 DIAGNOSIS — Z1283 Encounter for screening for malignant neoplasm of skin: Secondary | ICD-10-CM | POA: Diagnosis not present

## 2023-10-19 DIAGNOSIS — G4733 Obstructive sleep apnea (adult) (pediatric): Secondary | ICD-10-CM | POA: Diagnosis not present

## 2023-11-06 ENCOUNTER — Other Ambulatory Visit: Payer: Self-pay | Admitting: Orthopedic Surgery

## 2023-11-14 DIAGNOSIS — N393 Stress incontinence (female) (male): Secondary | ICD-10-CM | POA: Diagnosis not present

## 2023-11-14 DIAGNOSIS — Z8546 Personal history of malignant neoplasm of prostate: Secondary | ICD-10-CM | POA: Diagnosis not present

## 2023-11-14 DIAGNOSIS — N5231 Erectile dysfunction following radical prostatectomy: Secondary | ICD-10-CM | POA: Diagnosis not present

## 2023-12-04 ENCOUNTER — Other Ambulatory Visit: Payer: Self-pay | Admitting: Urology

## 2023-12-15 ENCOUNTER — Ambulatory Visit (INDEPENDENT_AMBULATORY_CARE_PROVIDER_SITE_OTHER): Payer: BC Managed Care – PPO | Admitting: Orthopedic Surgery

## 2023-12-15 ENCOUNTER — Encounter: Payer: Self-pay | Admitting: Orthopedic Surgery

## 2023-12-15 VITALS — BP 160/102 | Ht 71.0 in | Wt 250.0 lb

## 2023-12-15 DIAGNOSIS — M5412 Radiculopathy, cervical region: Secondary | ICD-10-CM | POA: Diagnosis not present

## 2023-12-15 NOTE — Progress Notes (Signed)
Orthopedic Spine Surgery Office Note   Assessment: Patient is a 63 y.o. male with neck pain that radiates into the left shoulder. Has foraminal stenosis at C3/4     Plan: -Patient has tried PT, tylenol, aleve, prednisone, lyrica, cervical ESI -Since he is getting good relief with lyrica and tizanidine, would continue that combination -Cervical ESI did not give him lasting relief, so would not try again his C4 radiculopathy -Could use medrol dose pak in the future for flare of pain -Patient should return to office in 3 months, x-rays at next visit: none     Patient expressed understanding of the plan and all questions were answered to the patient's satisfaction.    ___________________________________________________________________________     History:   Patient is a 63 y.o. male who presents today for follow up on his cervical spine.  Patient has now had about about 6 months of neck pain that radiates into his left upper extremity. It goes into his shoulder, scapula, and lateral arm to the level of the humerus. No pain radiating into his right upper extremity. He is still getting tolerable amount of relief with tizanidine and lyrica. He is taking lyrica once a day and tizanidine once per day.    Treatments tried:  PT, tylenol, aleve, prednisone, lyrica, cervical ESI     Physical Exam:   General: no acute distress, appears stated age Neurologic: alert, answering questions appropriately, following commands Respiratory: unlabored breathing on room air, symmetric chest rise Psychiatric: appropriate affect, normal cadence to speech     MSK (spine):   -Strength exam                                                   Left                  Right Grip strength                5/5                  5/5 Interosseus                  5/5                  5/5 Wrist extension            5/5                  5/5 Wrist flexion                 5/5                  5/5 Elbow flexion                 5/5                  5/5 Deltoid                          5/5                  5/5   -Sensory exam                           Sensation intact to light touch in  C5-T1 nerve distributions of bilateral upper extremities   -Brachioradialis DTR: 2/4 on the left, 2/4 on the right -Biceps DTR: 2/4 on the left, 2/4 on the right   -Spurling: negative bilaterally -Hoffman sign: negative bilaterally -Clonus: no beats bilaterally -Interosseous wasting: none seen -Grip and release test: negative -Romberg: negative -Gait: normal   Left shoulder exam: pain with external rotation past 80 degrees, negative jobe, negative belly press, no weakness with external rotation with arm at side Right shoulder exam: no pain through range of motion   Imaging: XRs of the cervical spine from 08/25/2023 were previously independently reviewed and interpreted, showing C3/4 disc height loss with anterior osteophyte formation. C5/6 disc height loss as well. No fracture or dislocation seen. No evidence of instability on flexion/extension.    MRI of the cervical spine from 04/24/2023 was previously independently reviewed and interpreted, showing bilateral foraminal stenosis at C3/4. Right sided foraminal stenosis at C5/6. No other stenosis seen. No T2 cord signal change seen.      Patient name: Anthony Anderson Patient MRN: [id number] Date of visit: 12/15/23

## 2023-12-23 DIAGNOSIS — R0789 Other chest pain: Secondary | ICD-10-CM | POA: Diagnosis not present

## 2023-12-23 DIAGNOSIS — I10 Essential (primary) hypertension: Secondary | ICD-10-CM | POA: Diagnosis not present

## 2023-12-23 DIAGNOSIS — M109 Gout, unspecified: Secondary | ICD-10-CM | POA: Diagnosis not present

## 2023-12-23 DIAGNOSIS — Z8546 Personal history of malignant neoplasm of prostate: Secondary | ICD-10-CM | POA: Diagnosis not present

## 2023-12-29 DIAGNOSIS — N393 Stress incontinence (female) (male): Secondary | ICD-10-CM | POA: Diagnosis not present

## 2023-12-29 DIAGNOSIS — M519 Unspecified thoracic, thoracolumbar and lumbosacral intervertebral disc disorder: Secondary | ICD-10-CM | POA: Diagnosis not present

## 2023-12-29 DIAGNOSIS — I10 Essential (primary) hypertension: Secondary | ICD-10-CM | POA: Diagnosis not present

## 2023-12-29 DIAGNOSIS — R0789 Other chest pain: Secondary | ICD-10-CM | POA: Diagnosis not present

## 2023-12-29 DIAGNOSIS — E785 Hyperlipidemia, unspecified: Secondary | ICD-10-CM | POA: Diagnosis not present

## 2023-12-29 DIAGNOSIS — Z8546 Personal history of malignant neoplasm of prostate: Secondary | ICD-10-CM | POA: Diagnosis not present

## 2023-12-29 DIAGNOSIS — N5231 Erectile dysfunction following radical prostatectomy: Secondary | ICD-10-CM | POA: Diagnosis not present

## 2023-12-29 DIAGNOSIS — D414 Neoplasm of uncertain behavior of bladder: Secondary | ICD-10-CM | POA: Diagnosis not present

## 2023-12-29 DIAGNOSIS — M50322 Other cervical disc degeneration at C5-C6 level: Secondary | ICD-10-CM | POA: Diagnosis not present

## 2023-12-30 ENCOUNTER — Other Ambulatory Visit: Payer: Self-pay | Admitting: Urology

## 2024-01-10 NOTE — Patient Instructions (Signed)
 SURGICAL WAITING ROOM VISITATION Patients having surgery or a procedure may have no more than 2 support people in the waiting area - these visitors may rotate in the visitor waiting room.   Due to an increase in RSV and influenza rates and associated hospitalizations, children ages 56 and under may not visit patients in [facility] hospitals. If the patient needs to stay at the hospital during part of their recovery, the visitor guidelines for inpatient rooms apply.  PRE-OP VISITATION  Pre-op nurse will coordinate an appropriate time for 1 support person to accompany the patient in pre-op.  This support person may not rotate.  This visitor will be contacted when the time is appropriate for the visitor to come back in the pre-op area.  Please refer to the [facility] website for the visitor guidelines for Inpatients (after your surgery is over and you are in a regular room).  You are not required to quarantine at this time prior to your surgery. However, you must do this: Hand Hygiene often Do NOT share personal items Notify your provider if you are in close contact with someone who has COVID or you develop fever 100.4 or greater, new onset of sneezing, cough, sore throat, shortness of breath or body aches.  If you test positive for Covid or have been in contact with anyone that has tested positive in the last 10 days please notify you surgeon.    Your procedure is scheduled on:  [facility]  January 14, 2024  Report to [facility] Main Entrance: [facility] entrance where the [employer] is available.   Report to admitting at: 07:15    AM  Call this number if you have any questions or problems the morning of surgery [phone]  DO NOT EAT OR DRINK ANYTHING AFTER MIDNIGHT THE NIGHT PRIOR TO YOUR SURGERY / PROCEDURE.   FOLLOW  ANY ADDITIONAL PRE OP INSTRUCTIONS YOU RECEIVED FROM YOUR SURGEON'S OFFICE!!!   Oral Hygiene is also important to reduce your risk of infection.         Remember - BRUSH YOUR TEETH THE MORNING OF SURGERY WITH YOUR REGULAR TOOTHPASTE  Do NOT smoke after Midnight the night before surgery.  STOP TAKING all Vitamins, Herbs and supplements 1 week before your surgery.   Take ONLY these medicines the morning of surgery with A SIP OF WATER: Pantoprazole, allopurinol, pregabalin. You may use your Eye drops and your Albuterol inhaler if needed. Please bring your inhaler with you on the day of surgery.   If You have been diagnosed with Sleep Apnea - Bring CPAP mask and tubing day of surgery. We will provide you with a CPAP machine on the day of your surgery.                   You may not have any metal on your body including  jewelry, and body piercing  Do not wear  lotions, powders, cologne, or deodorant  Men may shave face and neck.  Contacts, Hearing Aids, dentures or bridgework may not be worn into surgery. DENTURES WILL BE REMOVED PRIOR TO SURGERY PLEASE DO NOT APPLY "Poly grip" OR ADHESIVES!!!   Patients discharged on the day of surgery will not be allowed to drive home.  Someone NEEDS to stay with you for the first 24 hours after anesthesia.  Do not bring your home medications to the hospital. The Pharmacy will dispense medications listed on your medication list to you during your admission in the Hospital.  Please read over the following fact sheets you were given: IF YOU HAVE QUESTIONS ABOUT YOUR PRE-OP INSTRUCTIONS, PLEASE CALL [phone].   [facility] - Preparing for Surgery Before surgery, you can play an important role.  Because skin is not sterile, your skin needs to be as free of germs as possible.  You can reduce the number of germs on your skin by washing with CHG (chlorahexidine gluconate) soap before surgery.  CHG is an antiseptic cleaner which kills germs and bonds with the skin to continue killing germs even after washing. Please DO NOT use if you have an allergy to CHG or antibacterial soaps.  If your skin becomes  reddened/irritated stop using the CHG and inform your nurse when you arrive at Short Stay. Do not shave (including legs and underarms) for at least 48 hours prior to the first CHG shower.  You may shave your face/neck.  Please follow these instructions carefully:  1.  Shower with CHG Soap the night before surgery and the  morning of surgery.  2.  If you choose to wash your hair, wash your hair first as usual with your normal  shampoo.  3.  After you shampoo, rinse your hair and body thoroughly to remove the shampoo.                             4.  Use CHG as you would any other liquid soap.  You can apply chg directly to the skin and wash.  Gently with a scrungie or clean washcloth.  5.  Apply the CHG Soap to your body ONLY FROM THE NECK DOWN.   Do not use on face/ open                           Wound or open sores. Avoid contact with eyes, ears mouth and genitals (private parts).                       Wash face,  Genitals (private parts) with your normal soap.             6.  Wash thoroughly, paying special attention to the area where your  surgery  will be performed.  7.  Thoroughly rinse your body with warm water from the neck down.  8.  DO NOT shower/wash with your normal soap after using and rinsing off the CHG Soap.            9.  Pat yourself dry with a clean towel.            10.  Wear clean pajamas.            11.  Place clean sheets on your bed the night of your first shower and do not  sleep with pets.  ON THE DAY OF SURGERY : Do not apply any lotions/deodorants the morning of surgery.  Please wear clean clothes to the hospital/surgery center.    FAILURE TO FOLLOW THESE INSTRUCTIONS MAY RESULT IN THE CANCELLATION OF YOUR SURGERY  PATIENT SIGNATURE_________________________________  NURSE SIGNATURE__________________________________  ________________________________________________________________________

## 2024-01-10 NOTE — Progress Notes (Signed)
 COVID Vaccine received:  []  No [x]  Yes Date of any COVID positive Test in last 90 days:  none  PCP - Fidel Levy, MD Cardiologist - saw Dr. Verdis Prime     Chest x-ray - 04-13-2023  2v  Epic EKG -  04-13-2023  Epic Stress Test - 04-14-2023  Epic ECHO - 04-14-2023  Epic Cardiac Cath - 10-26-2014  LHC / CORS by Dr. Verdis Prime  PCR screen: []  Ordered & Completed []   No Order but Needs PROFEND     [x]   N/A for this surgery  Surgery Plan:  [x]  Ambulatory   []  Outpatient in bed  []  Admit Anesthesia:    [x]  General  []  Spinal  []   Choice []   MAC  Bowel Prep - [x]  No  []   Yes ______  Pacemaker / ICD device [x]  No []  Yes   Spinal Cord Stimulator:[x]  No []  Yes       History of Sleep Apnea? []  No [x]  Yes   CPAP used?- []  No [x]  Yes    Does the patient monitor blood sugar?   [x]  N/A   []  No []  Yes  Patient has: [x]  NO Hx DM   []  Pre-DM   []  DM1  []   DM2 Last A1c was:  5.3 normal on 04-14-23      Blood Thinner / Instructions:  none Aspirin Instructions:  none  ERAS Protocol Ordered: [x]  No  []  Yes Patient is to be NPO after: midnight prior  Dental hx: []  Dentures:  []  N/A      [x]  Bridge or Partial: Upper partial                  []  Loose or Damaged teeth:   Comments: Patient's BP was elevated x 3 at PST; I ordered an EKG to be done.  His last EKG was 03-2023 and is in EPIC.   I called Zona and left VM re: ?cardiac clearance on the patient. Zona left a message that patient was originally scheduled for a penile implant and she has clearance for that surgery. But, surgery had to be changed to a TURBT at the last minute, and that surgery is only 30 minutes.   Activity level: Patient is able to climb a flight of stairs without difficulty; [x]  No CP  [x]  No SOB  Patient can perform ADLs without assistance.   Anesthesia review: HTN, GERD, gout, CAD,  (LHC 2015)  CKD, OSA-CPAP  Patient denies shortness of breath, fever, cough and chest pain at PAT appointment.  Patient verbalized  understanding and agreement to the Pre-Surgical Instructions that were given to them at this PAT appointment. Patient was also educated of the need to review these PAT instructions again prior to his surgery.I reviewed the appropriate phone numbers to call if they have any and questions or concerns.

## 2024-01-12 ENCOUNTER — Encounter (HOSPITAL_COMMUNITY)
Admission: RE | Admit: 2024-01-12 | Discharge: 2024-01-12 | Disposition: A | Discharge status: Home / Self Care | Source: Ambulatory Visit | Attending: Urology | Admitting: Urology

## 2024-01-12 ENCOUNTER — Other Ambulatory Visit: Payer: Self-pay

## 2024-01-12 ENCOUNTER — Encounter (HOSPITAL_COMMUNITY): Payer: Self-pay

## 2024-01-12 VITALS — BP 157/92 | HR 69 | Temp 98.6°F | Resp 20 | Ht 70.0 in | Wt 264.0 lb

## 2024-01-12 DIAGNOSIS — D494 Neoplasm of unspecified behavior of bladder: Secondary | ICD-10-CM | POA: Insufficient documentation

## 2024-01-12 DIAGNOSIS — I2510 Atherosclerotic heart disease of native coronary artery without angina pectoris: Secondary | ICD-10-CM

## 2024-01-12 DIAGNOSIS — Z8546 Personal history of malignant neoplasm of prostate: Secondary | ICD-10-CM | POA: Insufficient documentation

## 2024-01-12 DIAGNOSIS — Z87891 Personal history of nicotine dependence: Secondary | ICD-10-CM | POA: Insufficient documentation

## 2024-01-12 DIAGNOSIS — I10 Essential (primary) hypertension: Secondary | ICD-10-CM | POA: Insufficient documentation

## 2024-01-12 DIAGNOSIS — K219 Gastro-esophageal reflux disease without esophagitis: Secondary | ICD-10-CM | POA: Diagnosis not present

## 2024-01-12 DIAGNOSIS — Z01818 Encounter for other preprocedural examination: Secondary | ICD-10-CM | POA: Insufficient documentation

## 2024-01-12 DIAGNOSIS — G4730 Sleep apnea, unspecified: Secondary | ICD-10-CM | POA: Insufficient documentation

## 2024-01-12 DIAGNOSIS — C674 Malignant neoplasm of posterior wall of bladder: Secondary | ICD-10-CM | POA: Diagnosis not present

## 2024-01-12 DIAGNOSIS — K449 Diaphragmatic hernia without obstruction or gangrene: Secondary | ICD-10-CM | POA: Diagnosis not present

## 2024-01-12 HISTORY — DX: Unspecified osteoarthritis, unspecified site: M19.90

## 2024-01-12 HISTORY — DX: Myoneural disorder, unspecified: G70.9

## 2024-01-12 LAB — BASIC METABOLIC PANEL
Anion gap: 8 (ref 5–15)
BUN: 13 mg/dL (ref 8–23)
CO2: 20 mmol/L — ABNORMAL LOW (ref 22–32)
Calcium: 9 mg/dL (ref 8.9–10.3)
Chloride: 111 mmol/L (ref 98–111)
Creatinine, Ser: 1.11 mg/dL (ref 0.61–1.24)
GFR, Estimated: >60 mL/min (ref >60)
Glucose, Bld: 148 mg/dL — ABNORMAL HIGH (ref 70–99)
Potassium: 3.7 mmol/L (ref 3.5–5.1)
Sodium: 139 mmol/L (ref 135–145)

## 2024-01-12 LAB — CBC
HCT: 47.7 % (ref 39.0–52.0)
Hemoglobin: 16 g/dL (ref 13.0–17.0)
MCH: 29.1 pg (ref 26.0–34.0)
MCHC: 33.5 g/dL (ref 30.0–36.0)
MCV: 86.9 fL (ref 80.0–100.0)
Platelets: 185 10*3/uL (ref 150–400)
RBC: 5.49 MIL/uL (ref 4.22–5.81)
RDW: 13 % (ref 11.5–15.5)
WBC: 4.7 10*3/uL (ref 4.0–10.5)
nRBC: 0 % (ref 0.0–0.2)

## 2024-01-13 NOTE — Progress Notes (Signed)
 Anesthesia Chart Review   Case: [id number] Date/Time: 01/14/24 0915   Procedure: TURBT (TRANSURETHRAL RESECTION OF BLADDER TUMOR) - 30 MINUTES NEEDED FOR CASE   Anesthesia type: General   Pre-op diagnosis: BLADDER TUMOR   Location[facility]   Surgeons: Despina Arias, MD       DISCUSSION:63 y.o. former smoker with h/o HTN, sleep apnea with CPAP, prostate cancer, bladder tumor scheduled for above procedure 01/14/2024 with Dr. Traci Sermon.   10/26/2014 which demonstrated a 30% mid LAD stenosis.   Stress test 04/14/2023 los risk study.   VS: BP (!) 157/92 Comment: right arm sitting  Pulse 69   Temp 37 C (Oral)   Resp 20   Ht 5\' 10"  (1.778 m)   Wt 119.7 kg   SpO2 98%   BMI 37.88 kg/m   PROVIDERS: Benita Stabile, MD is PCP    LABS: Labs reviewed: Acceptable for surgery. (all labs ordered are listed, but only abnormal results are displayed)  Labs Reviewed  BASIC METABOLIC PANEL - Abnormal; Notable for the following components:      Result Value   CO2 20 (*)    Glucose, Bld 148 (*)    All other components within normal limits  CBC     IMAGES:   EKG:   CV: Myocardial Perfusion 04/14/2023   Lexiscan stress shows no EKG changes to suggest ischemia   Myoview scan shows normal perfusion  No ischemia or scar   LVEF on gating calculated at 54%   Overall low risk study  Echo 04/14/2023 1. Left ventricular ejection fraction, by estimation, is 65 to 70%. The  left ventricle has normal function. The left ventricle has no regional  wall motion abnormalities. Left ventricular diastolic parameters were  normal.   2. Right ventricular systolic function is normal. The right ventricular  size is normal.   3. Left atrial size was mildly dilated.   4. The mitral valve is normal in structure. Trivial mitral valve  regurgitation.   5. The aortic valve is tricuspid. Aortic valve regurgitation is not  visualized. Aortic valve sclerosis is present, with no  evidence of aortic  valve stenosis.   6. The inferior vena cava is normal in size with greater than 50%  respiratory variability, suggesting right atrial pressure of 3 mmHg.  Past Medical History:  Diagnosis Date   Arthritis    Cancer [facility])    prostate, currently watching for any changes   Cholelithiasis    Dyspnea 2015   Dysrhythmia    GERD (gastroesophageal reflux disease)    History of hiatal hernia    History of kidney stones    Hyperlipidemia    Hypertension    Kidney stones    Neuromuscular disorder (HCC)    cervical radiculopathy down both arms,   PSA elevation    Sleep apnea    uses CPAP    Past Surgical History:  Procedure Laterality Date   CARDIAC CATHETERIZATION     COLONOSCOPY WITH PROPOFOL N/A 02/06/2022   Procedure: COLONOSCOPY WITH PROPOFOL;  Surgeon: Dolores Frame, MD;  Location: [facility];  Service: Gastroenterology;  Laterality: N/A;  10:00   CYSTOSCOPY W/ URETERAL STENT REMOVAL Right 02/24/2020   Procedure: CYSTOSCOPY WITH STENT REMOVAL;  Surgeon: Marcine Matar, MD;  Location[facility];  Service: Urology;  Laterality: Right;  1 HR   CYSTOSCOPY WITH RETROGRADE PYELOGRAM, URETEROSCOPY AND STENT PLACEMENT Right 02/24/2020   Procedure: CYSTOSCOPY WITH RETROGRADE PYELOGRAM, URETEROSCOPY AND  STENT PLACEMENT;  Surgeon: Marcine Matar, MD;  Location: [facility];  Service: Urology;  Laterality: Right;   CYSTOSCOPY WITH STENT PLACEMENT Right 02/07/2020   Procedure: CYSTOSCOPY RIGHT RETROGRADE PYLEOGRAM WITH RIGHT STENT PLACEMENT;  Surgeon: Noel Christmas, MD;  [facility];  Service: Urology;  Laterality: Right;   HOLMIUM LASER APPLICATION Right 02/24/2020   Procedure: HOLMIUM LASER APPLICATION;  Surgeon: Marcine Matar, MD;  [facility];  Service: Urology;  Laterality: Right;   LEFT HEART CATHETERIZATION WITH CORONARY ANGIOGRAM N/A 10/26/2014   Procedure: LEFT HEART CATHETERIZATION WITH CORONARY ANGIOGRAM;  Surgeon: Lesleigh Noe, MD;   Location: [facility] CATH LAB;  Service: Cardiovascular;  Laterality: N/A;   LITHOTRIPSY     MASS EXCISION Left 01/09/2018   Procedure: EXCISION SUBCUTANEOUS MASS, CHEST WALL;  Surgeon: Franky Macho, MD;  Location: [facility];  Service: General;  Laterality: Left;   NASAL SINUS SURGERY     PELVIC LYMPH NODE DISSECTION Bilateral 09/11/2018   Procedure: PELVIC LYMPH NODE DISSECTION;  Surgeon: Crist Fat, MD;  [facility];  Service: Urology;  Laterality: Bilateral;   POLYPECTOMY  02/06/2022   Procedure: POLYPECTOMY;  Surgeon: Dolores Frame, MD;  [facility];  Service: Gastroenterology;;   ROBOT ASSISTED LAPAROSCOPIC RADICAL PROSTATECTOMY N/A 09/11/2018   Procedure: XI ROBOTIC ASSISTED LAPAROSCOPIC RADICAL PROSTATECTOMY;  Surgeon: Crist Fat, MD;  Location[facility];  Service: Urology;  Laterality: N/A;    MEDICATIONS:  albuterol (VENTOLIN HFA) 108 (90 Base) MCG/ACT inhaler   allopurinol (ZYLOPRIM) 100 MG tablet   Artificial Tear Solution (SOOTHE XP OP)   atorvastatin (LIPITOR) 40 MG tablet   colchicine 0.6 MG tablet   ibuprofen (IBU) 400 MG tablet   metoprolol succinate (TOPROL-XL) 25 MG 24 hr tablet   Multiple Vitamin (MULTIVITAMIN WITH MINERALS) TABS tablet   olmesartan (BENICAR) 40 MG tablet   pantoprazole (PROTONIX) 40 MG tablet   pregabalin (LYRICA) 75 MG capsule   tiZANidine (ZANAFLEX) 4 MG tablet   VASCEPA 1 g capsule   No current facility-administered medications for this encounter.     Jodell Cipro Ward, PA-C [facility] [phone]

## 2024-01-13 NOTE — Anesthesia Preprocedure Evaluation (Signed)
 Anesthesia Evaluation  Patient identified by MRN, date of birth, ID band Patient awake    Reviewed: Allergy & Precautions, NPO status , Patient's Chart, lab work & pertinent test results, reviewed documented beta blocker date and time   Airway Mallampati: III  TM Distance: >3 FB Neck ROM: Full    Dental  (+) Dental Advisory Given, Partial Upper,    Pulmonary sleep apnea and Continuous Positive Airway Pressure Ventilation , former smoker   Pulmonary exam normal breath sounds clear to auscultation       Cardiovascular hypertension, Pt. on home beta blockers and Pt. on medications Normal cardiovascular exam Rhythm:Regular Rate:Normal     Neuro/Psych  Neuromuscular disease  negative psych ROS   GI/Hepatic Neg liver ROS, hiatal hernia,GERD  Medicated,,  Endo/Other  Obesity   Renal/GU Renal InsufficiencyRenal disease   Bladder cancer     Musculoskeletal  (+) Arthritis ,    Abdominal   Peds  Hematology negative hematology ROS (+)   Anesthesia Other Findings   Reproductive/Obstetrics                             Anesthesia Physical Anesthesia Plan  ASA: 3  Anesthesia Plan: General   Post-op Pain Management: Tylenol PO (pre-op)*   Induction: Intravenous  PONV Risk Score and Plan: 3 and Midazolam, Dexamethasone and Ondansetron  Airway Management Planned: Oral ETT  Additional Equipment:   Intra-op Plan:   Post-operative Plan: Extubation in OR  Informed Consent: I have reviewed the patients History and Physical, chart, labs and discussed the procedure including the risks, benefits and alternatives for the proposed anesthesia with the patient or authorized representative who has indicated his/her understanding and acceptance.     Dental advisory given  Plan Discussed with: CRNA  Anesthesia Plan Comments:         Anesthesia Quick Evaluation

## 2024-01-14 ENCOUNTER — Encounter (HOSPITAL_BASED_OUTPATIENT_CLINIC_OR_DEPARTMENT_OTHER): Payer: Self-pay

## 2024-01-14 ENCOUNTER — Ambulatory Visit (HOSPITAL_BASED_OUTPATIENT_CLINIC_OR_DEPARTMENT_OTHER): Admit: 2024-01-14 | Payer: BC Managed Care – PPO | Admitting: Urology

## 2024-01-14 ENCOUNTER — Ambulatory Visit (HOSPITAL_COMMUNITY)
Admission: RE | Admit: 2024-01-14 | Discharge: 2024-01-14 | Disposition: A | Discharge status: Home / Self Care | Attending: Urology | Admitting: Urology

## 2024-01-14 ENCOUNTER — Encounter (HOSPITAL_COMMUNITY): Payer: Self-pay | Admitting: Urology

## 2024-01-14 ENCOUNTER — Ambulatory Visit (HOSPITAL_COMMUNITY): Payer: Self-pay | Admitting: Certified Registered"

## 2024-01-14 ENCOUNTER — Encounter (HOSPITAL_COMMUNITY)
Admission: RE | Disposition: A | Discharge status: Home / Self Care | Payer: Self-pay | Source: Home / Self Care | Attending: Urology

## 2024-01-14 ENCOUNTER — Ambulatory Visit: Admit: 2024-01-14 | Admitting: Urology

## 2024-01-14 DIAGNOSIS — C674 Malignant neoplasm of posterior wall of bladder: Secondary | ICD-10-CM | POA: Insufficient documentation

## 2024-01-14 DIAGNOSIS — K219 Gastro-esophageal reflux disease without esophagitis: Secondary | ICD-10-CM | POA: Insufficient documentation

## 2024-01-14 DIAGNOSIS — Z87891 Personal history of nicotine dependence: Secondary | ICD-10-CM | POA: Insufficient documentation

## 2024-01-14 DIAGNOSIS — I10 Essential (primary) hypertension: Secondary | ICD-10-CM | POA: Insufficient documentation

## 2024-01-14 DIAGNOSIS — G4730 Sleep apnea, unspecified: Secondary | ICD-10-CM | POA: Insufficient documentation

## 2024-01-14 DIAGNOSIS — I129 Hypertensive chronic kidney disease with stage 1 through stage 4 chronic kidney disease, or unspecified chronic kidney disease: Secondary | ICD-10-CM | POA: Diagnosis not present

## 2024-01-14 DIAGNOSIS — K449 Diaphragmatic hernia without obstruction or gangrene: Secondary | ICD-10-CM | POA: Diagnosis not present

## 2024-01-14 DIAGNOSIS — N184 Chronic kidney disease, stage 4 (severe): Secondary | ICD-10-CM | POA: Diagnosis not present

## 2024-01-14 DIAGNOSIS — D090 Carcinoma in situ of bladder: Secondary | ICD-10-CM | POA: Diagnosis not present

## 2024-01-14 DIAGNOSIS — D494 Neoplasm of unspecified behavior of bladder: Secondary | ICD-10-CM | POA: Diagnosis not present

## 2024-01-14 HISTORY — PX: TRANSURETHRAL RESECTION OF BLADDER TUMOR: SHX2575

## 2024-01-14 SURGERY — INSERTION, PENILE PROSTHESIS
Anesthesia: General

## 2024-01-14 SURGERY — TURBT (TRANSURETHRAL RESECTION OF BLADDER TUMOR)
Anesthesia: General

## 2024-01-14 MED ORDER — MIDAZOLAM HCL 2 MG/2ML IJ SOLN
INTRAMUSCULAR | Status: DC | PRN
  Administered 2024-01-14: 2 mg via INTRAVENOUS

## 2024-01-14 MED ORDER — ROCURONIUM BROMIDE 10 MG/ML (PF) SYRINGE
PREFILLED_SYRINGE | INTRAVENOUS | Status: DC | PRN
  Administered 2024-01-14: 60 mg via INTRAVENOUS

## 2024-01-14 MED ORDER — FENTANYL CITRATE (PF) 100 MCG/2ML IJ SOLN
INTRAMUSCULAR | Status: DC | PRN
  Administered 2024-01-14: 100 ug via INTRAVENOUS

## 2024-01-14 MED ORDER — DEXAMETHASONE SODIUM PHOSPHATE 10 MG/ML IJ SOLN
INTRAMUSCULAR | Status: DC | PRN
  Administered 2024-01-14: 4 mg via INTRAVENOUS

## 2024-01-14 MED ORDER — HYOSCYAMINE SULFATE 0.125 MG SL SUBL: 0.1250 mg | SUBLINGUAL_TABLET | Freq: Four times a day (QID) | SUBLINGUAL | 0 refills | Status: DC | PRN

## 2024-01-14 MED ORDER — ONDANSETRON HCL 4 MG/2ML IJ SOLN
INTRAMUSCULAR | Status: DC | PRN
Start: 2024-01-14 — End: 2024-01-14
  Administered 2024-01-14: 4 mg via INTRAVENOUS

## 2024-01-14 MED ORDER — CHLORHEXIDINE GLUCONATE 0.12 % MT SOLN
15.0000 mL | Freq: Once | OROMUCOSAL | Status: AC
  Administered 2024-01-14: 15 mL via OROMUCOSAL

## 2024-01-14 MED ORDER — SUGAMMADEX SODIUM 200 MG/2ML IV SOLN
INTRAVENOUS | Status: DC | PRN
  Administered 2024-01-14: 240 mg via INTRAVENOUS

## 2024-01-14 MED ORDER — CIPROFLOXACIN IN D5W 400 MG/200ML IV SOLN
400.0000 mg | INTRAVENOUS | Status: AC
  Administered 2024-01-14: 400 mg via INTRAVENOUS
  Filled 2024-01-14: qty 200

## 2024-01-14 MED ORDER — LACTATED RINGERS IV SOLN: INTRAVENOUS | Status: DC

## 2024-01-14 MED ORDER — LIDOCAINE 2% (20 MG/ML) 5 ML SYRINGE: INTRAMUSCULAR | Status: DC | PRN

## 2024-01-14 MED ORDER — SUGAMMADEX SODIUM 200 MG/2ML IV SOLN
INTRAVENOUS | Status: AC
  Filled 2024-01-14: qty 4

## 2024-01-14 MED ORDER — PROPOFOL 10 MG/ML IV BOLUS
INTRAVENOUS | Status: AC
  Filled 2024-01-14: qty 20

## 2024-01-14 MED ORDER — ACETAMINOPHEN 500 MG PO TABS
1000.0000 mg | ORAL_TABLET | Freq: Once | ORAL | Status: AC
  Administered 2024-01-14: 1000 mg via ORAL
  Filled 2024-01-14: qty 2

## 2024-01-14 MED ORDER — STERILE WATER FOR IRRIGATION IR SOLN
Status: DC | PRN
  Administered 2024-01-14: 3000 mL

## 2024-01-14 MED ORDER — DEXAMETHASONE SODIUM PHOSPHATE 10 MG/ML IJ SOLN
INTRAMUSCULAR | Status: AC
  Filled 2024-01-14: qty 1

## 2024-01-14 MED ORDER — FENTANYL CITRATE (PF) 100 MCG/2ML IJ SOLN
INTRAMUSCULAR | Status: AC
  Filled 2024-01-14: qty 2

## 2024-01-14 MED ORDER — ORAL CARE MOUTH RINSE: 15.0000 mL | Freq: Once | OROMUCOSAL | Status: AC

## 2024-01-14 MED ORDER — TRAMADOL HCL 50 MG PO TABS: 50.0000 mg | ORAL_TABLET | Freq: Four times a day (QID) | ORAL | 0 refills | Status: DC | PRN

## 2024-01-14 MED ORDER — LIDOCAINE HCL (PF) 2 % IJ SOLN
INTRAMUSCULAR | Status: DC | PRN
  Administered 2024-01-14: 100 mg via INTRADERMAL

## 2024-01-14 MED ORDER — MIDAZOLAM HCL 2 MG/2ML IJ SOLN
INTRAMUSCULAR | Status: AC
  Filled 2024-01-14: qty 2

## 2024-01-14 MED ORDER — PROPOFOL 10 MG/ML IV BOLUS
INTRAVENOUS | Status: DC | PRN
  Administered 2024-01-14: 170 mg via INTRAVENOUS

## 2024-01-14 MED ORDER — ROCURONIUM BROMIDE 10 MG/ML (PF) SYRINGE
PREFILLED_SYRINGE | INTRAVENOUS | Status: AC
  Filled 2024-01-14: qty 10

## 2024-01-14 MED ORDER — CEPHALEXIN 500 MG PO CAPS: 500.0000 mg | ORAL_CAPSULE | Freq: Two times a day (BID) | ORAL | 0 refills | Status: DC

## 2024-01-14 SURGICAL SUPPLY — 14 items
BAG URINE DRAIN 2000ML AR STRL (UROLOGICAL SUPPLIES) IMPLANT
BAG URO CATCHER STRL LF (MISCELLANEOUS) ×1 IMPLANT
DRAPE FOOT SWITCH (DRAPES) ×1 IMPLANT
ELECT REM PT RETURN 15FT ADLT (MISCELLANEOUS) ×1 IMPLANT
GLOVE SS BIOGEL STRL SZ 7 (GLOVE) ×1 IMPLANT
GOWN STRL REUS W/ TWL XL LVL3 (GOWN DISPOSABLE) ×1 IMPLANT
KIT TURNOVER KIT A (KITS) IMPLANT
LOOP CUT BIPOLAR 24F LRG (ELECTROSURGICAL) IMPLANT
MANIFOLD NEPTUNE II (INSTRUMENTS) ×1 IMPLANT
PACK CYSTO (CUSTOM PROCEDURE TRAY) ×1 IMPLANT
SYR TOOMEY IRRIG 70ML (MISCELLANEOUS) IMPLANT
SYRINGE TOOMEY IRRIG 70ML (MISCELLANEOUS) IMPLANT
TUBING CONNECTING 10 (TUBING) ×1 IMPLANT
TUBING UROLOGY SET (TUBING) ×1 IMPLANT

## 2024-01-14 NOTE — Anesthesia Postprocedure Evaluation (Signed)
 Anesthesia Post Note  Patient: BYRAN BILOTTI  Procedure(s) Performed: TURBT (TRANSURETHRAL RESECTION OF BLADDER TUMOR)     Patient location during evaluation: PACU Anesthesia Type: General Level of consciousness: awake and alert Pain management: pain level controlled Vital Signs Assessment: post-procedure vital signs reviewed and stable Respiratory status: spontaneous breathing, nonlabored ventilation and respiratory function stable Cardiovascular status: blood pressure returned to baseline and stable Postop Assessment: no apparent nausea or vomiting Anesthetic complications: no   No notable events documented.  Last Vitals:  Vitals:   01/14/24 1015 01/14/24 1030  BP: (!) 137/93 (!) 146/94  Pulse: 70 69  Resp: 18 19  Temp:    SpO2: 95% 93%    Last Pain:  Vitals:   01/14/24 1030  TempSrc:   PainSc: 0-No pain                 Collene Schlichter

## 2024-01-14 NOTE — Op Note (Signed)
 Preoperative diagnosis: Bladder tumor (0.5 cm)  Postoperative diagnosis:  Bladder tumor (0.5 cm)  Procedure:  Cystoscopy Transurethral resection of bladder tumor (0.5 cm)  Surgeon: Irine Seal M.D.  Anesthesia: General  Complications: None  Intraoperative findings:  Bladder tumor: 0.5 cm, posterior wall  EBL: Minimal  Specimens: Bladder tumor  Disposition of specimens: Pathology  Indication: Anthony Anderson is a patient who was found to have a bladder tumor. After reviewing the management options for treatment, he elected to proceed with the above surgical procedure(s). We have discussed the potential benefits and risks of the procedure, side effects of the proposed treatment, the likelihood of the patient achieving the goals of the procedure, and any potential problems that might occur during the procedure or recuperation. Informed consent has been obtained.  Description of procedure:  The patient was taken to the operating room and general anesthesia was induced.  The patient was placed in the dorsal lithotomy position, prepped and draped in the usual sterile fashion, and preoperative antibiotics were administered. A preoperative time-out was performed.   Cystourethroscopy was performed.  The patient's urethra was examined and was normal; prostate and veru surgically absent  The bladder was then systematically examined in its entirety.   The bladder tumor was 0.5cm.  It was located posterior and appeared papillary with a discrete stalk. I used cold cup forceps to resect it entirely. The bugee was used to fulgurate the resection bed.   The bladder was then emptied and the procedure ended.  The patient appeared to tolerate the procedure well and without complications.  The patient was able to be awakened and transferred to the recovery unit in satisfactory condition.    Despina Arias MD

## 2024-01-14 NOTE — Anesthesia Procedure Notes (Signed)
 Procedure Name: Intubation Date/Time: 01/14/2024 9:27 AM  Performed by: Sindy Guadeloupe, CRNAPre-anesthesia Checklist: Patient identified, Emergency Drugs available, Suction available, Patient being monitored and Timeout performed Patient Re-evaluated:Patient Re-evaluated prior to induction Oxygen Delivery Method: Circle system utilized Preoxygenation: Pre-oxygenation with 100% oxygen Induction Type: IV induction Ventilation: Mask ventilation without difficulty Laryngoscope Size: Mac and 4 Grade View: Grade I Tube type: Oral Number of attempts: 1 Airway Equipment and Method: Stylet Placement Confirmation: ETT inserted through vocal cords under direct vision, positive ETCO2 and breath sounds checked- equal and bilateral Secured at: 23 cm Tube secured with: Tape Dental Injury: Teeth and Oropharynx as per pre-operative assessment

## 2024-01-14 NOTE — H&P (Signed)
 H&P  History of Present Illness: Anthony Anderson is a 63 y.o. year old M who presented today for resection of bladder tumor  No acute complaints  Past Medical History:  Diagnosis Date   Arthritis    Cancer (HCC)    prostate, currently watching for any changes   Cholelithiasis    Dyspnea 2015   Dysrhythmia    GERD (gastroesophageal reflux disease)    History of hiatal hernia    History of kidney stones    Hyperlipidemia    Hypertension    Kidney stones    Neuromuscular disorder (HCC)    cervical radiculopathy down both arms,   PSA elevation    Sleep apnea    uses CPAP    Past Surgical History:  Procedure Laterality Date   CARDIAC CATHETERIZATION     COLONOSCOPY WITH PROPOFOL N/A 02/06/2022   Procedure: COLONOSCOPY WITH PROPOFOL;  Surgeon: Dolores Frame, MD;  Location: [facility];  Service: Gastroenterology;  Laterality: N/A;  10:00   CYSTOSCOPY W/ URETERAL STENT REMOVAL Right 02/24/2020   Procedure: CYSTOSCOPY WITH STENT REMOVAL;  Surgeon: Marcine Matar, MD;  Location: [facility];  Service: Urology;  Laterality: Right;  1 HR   CYSTOSCOPY WITH RETROGRADE PYELOGRAM, URETEROSCOPY AND STENT PLACEMENT Right 02/24/2020   Procedure: CYSTOSCOPY WITH RETROGRADE PYELOGRAM, URETEROSCOPY AND STENT PLACEMENT;  Surgeon: Marcine Matar, MD;  Location: [facility];  Service: Urology;  Laterality: Right;   CYSTOSCOPY WITH STENT PLACEMENT Right 02/07/2020   Procedure: CYSTOSCOPY RIGHT RETROGRADE PYLEOGRAM WITH RIGHT STENT PLACEMENT;  Surgeon: Noel Christmas, MD;  Location: [facility];  Service: Urology;  Laterality: Right;   HOLMIUM LASER APPLICATION Right 02/24/2020   Procedure: HOLMIUM LASER APPLICATION;  Surgeon: Marcine Matar, MD;  Location: [facility];  Service: Urology;  Laterality: Right;   LEFT HEART CATHETERIZATION WITH CORONARY ANGIOGRAM N/A 10/26/2014   Procedure: LEFT HEART CATHETERIZATION WITH CORONARY ANGIOGRAM;  Surgeon: Lesleigh Noe, MD;  Location: [facility];   Service: Cardiovascular;  Laterality: N/A;   LITHOTRIPSY     MASS EXCISION Left 01/09/2018   Procedure: EXCISION SUBCUTANEOUS MASS, CHEST WALL;  Surgeon: Franky Macho, MD;  Location: [facility];  Service: General;  Laterality: Left;   NASAL SINUS SURGERY     PELVIC LYMPH NODE DISSECTION Bilateral 09/11/2018   Procedure: PELVIC LYMPH NODE DISSECTION;  Surgeon: Crist Fat, MD;  Location: [facility];  Service: Urology;  Laterality: Bilateral;   POLYPECTOMY  02/06/2022   Procedure: POLYPECTOMY;  Surgeon: Dolores Frame, MD;  Location: [facility];  Service: Gastroenterology;;   ROBOT ASSISTED LAPAROSCOPIC RADICAL PROSTATECTOMY N/A 09/11/2018   Procedure: XI ROBOTIC ASSISTED LAPAROSCOPIC RADICAL PROSTATECTOMY;  Surgeon: Crist Fat, MD;  Location: [facility];  Service: Urology;  Laterality: N/A;    Home Medications:  Current Meds  Medication Sig   allopurinol (ZYLOPRIM) 100 MG tablet Take 100 mg by mouth daily.   Artificial Tear Solution (SOOTHE XP OP) Place 1 drop into both eyes at bedtime as needed (for dry eyes).   atorvastatin (LIPITOR) 40 MG tablet Take 40 mg by mouth daily.   colchicine 0.6 MG tablet Take 0.6 mg by mouth 2 (two) times daily as needed (gout).   ibuprofen (IBU) 400 MG tablet Take 400 mg by mouth in the morning. Additional 400 mg if needed for the pain   metoprolol succinate (TOPROL-XL) 25 MG 24 hr tablet Take 25 mg by mouth at bedtime.    Multiple Vitamin (MULTIVITAMIN WITH MINERALS) TABS  tablet Take 1 tablet by mouth daily.   olmesartan (BENICAR) 40 MG tablet Take 40 mg by mouth daily.   pantoprazole (PROTONIX) 40 MG tablet Take 40 mg by mouth daily.   pregabalin (LYRICA) 75 MG capsule TAKE ONE CAPSULE BY MOUTH TWICE DAILY (Patient taking differently: Take 75 mg by mouth in the morning.)   tiZANidine (ZANAFLEX) 4 MG tablet Take 4 mg by mouth at bedtime.   VASCEPA 1 g capsule Take 2 g by mouth in the morning.    Allergies: No Known Allergies  Family  History  Problem Relation Age of Onset   CAD Mother    CAD Father    Prostate cancer Father     Social History:  reports that he quit smoking about 41 years ago. His smoking use included cigarettes. He started smoking about 51 years ago. He has a 10 pack-year smoking history. He has been exposed to tobacco smoke. He has never used smokeless tobacco. He reports that he does not drink alcohol and does not use drugs.  ROS: A complete review of systems was performed.  All systems are negative except for pertinent findings as noted.  Physical Exam:  Vital signs in last 24 hours: Temp:  [98 F (36.7 C)] 98 F (36.7 C) (03/19 0745) Pulse Rate:  [62] 62 (03/19 0745) Resp:  [19] 19 (03/19 0745) BP: (148-164)/(97-101) 148/97 (03/19 0751) SpO2:  [96 %] 96 % (03/19 0745) Weight:  [119.7 kg] 119.7 kg (03/19 0807) Constitutional:  Alert and oriented, No acute distress Cardiovascular: Regular rate and rhythm Respiratory: Normal respiratory effort, Lungs clear bilaterally GI: Abdomen is soft, nontender, nondistended, no abdominal masses Lymphatic: No lymphadenopathy Neurologic: Grossly intact, no focal deficits Psychiatric: Normal mood and affect   Laboratory Data:  Recent Labs    01/12/24 1332  WBC 4.7  HGB 16.0  HCT 47.7  PLT 185    Recent Labs    01/12/24 1332  NA 139  K 3.7  CL 111  GLUCOSE 148*  BUN 13  CALCIUM 9.0  CREATININE 1.11     No results found for this or any previous visit (from the past 24 hours). No results found for this or any previous visit (from the past 240 hours).  Renal Function: Recent Labs    01/12/24 1332  CREATININE 1.11   Estimated Creatinine Clearance: 88.3 mL/min (by C-G formula based on SCr of 1.11 mg/dL).  Radiologic Imaging: No results found.  Assessment:  Anthony Anderson is a 63 y.o. year old M with a bladder tumor   Plan:  --to OR as planned for cysto with resection of tumor. Procedure and risks reviewed, including but not  limited to hematuria, infection, sepsis, damage to GU tract, failure to complete procedure, need for future procedures, need for catheter  Irine Seal, MD 01/14/2024, 8:25 AM  [facility] Pager: [phone]

## 2024-01-14 NOTE — Transfer of Care (Signed)
 Immediate Anesthesia Transfer of Care Note  Patient: MAK BONNY  Procedure(s) Performed: TURBT (TRANSURETHRAL RESECTION OF BLADDER TUMOR)  Patient Location: PACU  Anesthesia Type:General  Level of Consciousness: awake, alert , and patient cooperative  Airway & Oxygen Therapy: Patient Spontanous Breathing and Patient connected to face mask oxygen  Post-op Assessment: Report given to RN and Post -op Vital signs reviewed and stable  Post vital signs: Reviewed and stable  Last Vitals:  Vitals Value Taken Time  BP 150/91 01/14/24 1004  Temp    Pulse 71 01/14/24 1007  Resp 20 01/14/24 1007  SpO2 100 % 01/14/24 1007  Vitals shown include unfiled device data.  Last Pain:  Vitals:   01/14/24 0807  TempSrc:   PainSc: 0-No pain         Complications: No notable events documented.

## 2024-01-15 ENCOUNTER — Encounter (HOSPITAL_COMMUNITY): Payer: Self-pay | Admitting: Urology

## 2024-01-15 LAB — SURGICAL PATHOLOGY

## 2024-01-29 DIAGNOSIS — Z8546 Personal history of malignant neoplasm of prostate: Secondary | ICD-10-CM | POA: Diagnosis not present

## 2024-01-29 DIAGNOSIS — N5231 Erectile dysfunction following radical prostatectomy: Secondary | ICD-10-CM | POA: Diagnosis not present

## 2024-01-29 DIAGNOSIS — C674 Malignant neoplasm of posterior wall of bladder: Secondary | ICD-10-CM | POA: Diagnosis not present

## 2024-02-05 DIAGNOSIS — C674 Malignant neoplasm of posterior wall of bladder: Secondary | ICD-10-CM | POA: Diagnosis not present

## 2024-02-05 DIAGNOSIS — Z8546 Personal history of malignant neoplasm of prostate: Secondary | ICD-10-CM | POA: Diagnosis not present

## 2024-02-16 DIAGNOSIS — I10 Essential (primary) hypertension: Secondary | ICD-10-CM | POA: Diagnosis not present

## 2024-02-23 DIAGNOSIS — Z79899 Other long term (current) drug therapy: Secondary | ICD-10-CM | POA: Diagnosis not present

## 2024-02-23 DIAGNOSIS — I10 Essential (primary) hypertension: Secondary | ICD-10-CM | POA: Diagnosis not present

## 2024-02-23 DIAGNOSIS — Z87891 Personal history of nicotine dependence: Secondary | ICD-10-CM | POA: Diagnosis not present

## 2024-02-27 DIAGNOSIS — C674 Malignant neoplasm of posterior wall of bladder: Secondary | ICD-10-CM | POA: Diagnosis not present

## 2024-03-05 DIAGNOSIS — C674 Malignant neoplasm of posterior wall of bladder: Secondary | ICD-10-CM | POA: Diagnosis not present

## 2024-03-12 DIAGNOSIS — C674 Malignant neoplasm of posterior wall of bladder: Secondary | ICD-10-CM | POA: Diagnosis not present

## 2024-03-15 ENCOUNTER — Ambulatory Visit: Payer: BC Managed Care – PPO | Admitting: Orthopedic Surgery

## 2024-03-15 DIAGNOSIS — M5412 Radiculopathy, cervical region: Secondary | ICD-10-CM | POA: Diagnosis not present

## 2024-03-15 NOTE — Progress Notes (Addendum)
 Orthopedic Spine Surgery Office Note   Assessment: Patient is a 63 y.o. male with neck pain that radiates into the left shoulder. Has foraminal stenosis at C3/4     Plan: -Patient has tried PT, tylenol , aleve , prednisone , lyrica , tizanidine, cervical ESI -Patient was recently diagnosed with cancer and is undergoing treatment so we will hold off any spine surgery -He has noticed some relief with tizanidine and Lyrica  so he can continue with those -He is currently undergoing treatment and wants to follow-up in October once he has hopefully completed all of his cancer related treatment -Patient should return to office in 5 months, x-rays at next visit: AP/lateral/flex/ex cervical     Patient expressed understanding of the plan and all questions were answered to the patient's satisfaction.    ___________________________________________________________________________     History:   Patient is a 63 y.o. male who presents today for follow up on his cervical spine.  Since our last visit, patient got a cancer diagnosis.  He said it was a bladder cancer and he has been getting treatment for this.  He said he still undergoing treatment at this time.  He is still having neck pain that radiates into his left shoulder.  He feels like going into the shoulder, scapula, and lateral arm to the level of the mid humerus.  He has some pain radiating into the right upper extremity along the lateral arm and dorsal forearm but is not nearly as significant as the left arm.  He feels the pain on a daily basis.  He has been using the tizanidine and Lyrica  to help decrease the pain.  The pain has gotten worse though and these medications are not as effective as they had been.   Treatments tried:  PT, tylenol , aleve , prednisone , lyrica , tizanidine, cervical ESI     Physical Exam:   General: no acute distress, appears stated age Neurologic: alert, answering questions appropriately, following commands Respiratory:  unlabored breathing on room air, symmetric chest rise Psychiatric: appropriate affect, normal cadence to speech     MSK (spine):   -Strength exam                                                   Left                  Right Grip strength                5/5                  5/5 Interosseus                  5/5                  5/5 Wrist extension            5/5                  5/5 Wrist flexion                 5/5                  5/5 Elbow flexion                5/5  5/5 Deltoid                          5/5                  5/5   -Sensory exam                           Sensation intact to light touch in C5-T1 nerve distributions of bilateral upper extremities   -Brachioradialis DTR: 2/4 on the left, 2/4 on the right -Biceps DTR: 2/4 on the left, 2/4 on the right   -Spurling: negative bilaterally -Hoffman sign: negative bilaterally -Clonus: no beats bilaterally -Interosseous wasting: none seen -Grip and release test: negative -Romberg: negative -Gait: normal   Left shoulder exam: no pain through range of motion Right shoulder exam: no pain through range of motion   Imaging: XRs of the cervical spine from 08/25/2023 were previously independently reviewed and interpreted, showing C3/4 disc height loss with anterior osteophyte formation. C5/6 disc height loss as well. No fracture or dislocation seen. No evidence of instability on flexion/extension.    MRI of the cervical spine from 04/24/2023 was previously independently reviewed and interpreted, showing bilateral foraminal stenosis at C3/4. Right sided foraminal stenosis at C5/6. No other stenosis seen. No T2 cord signal change seen.      Patient name: Anthony Anderson Patient MRN: [id number] Date of visit: 03/15/24

## 2024-03-19 DIAGNOSIS — C674 Malignant neoplasm of posterior wall of bladder: Secondary | ICD-10-CM | POA: Diagnosis not present

## 2024-03-26 DIAGNOSIS — C674 Malignant neoplasm of posterior wall of bladder: Secondary | ICD-10-CM | POA: Diagnosis not present

## 2024-04-02 DIAGNOSIS — C674 Malignant neoplasm of posterior wall of bladder: Secondary | ICD-10-CM | POA: Diagnosis not present

## 2024-04-15 DIAGNOSIS — G4733 Obstructive sleep apnea (adult) (pediatric): Secondary | ICD-10-CM | POA: Diagnosis not present

## 2024-05-15 DIAGNOSIS — G4733 Obstructive sleep apnea (adult) (pediatric): Secondary | ICD-10-CM | POA: Diagnosis not present

## 2024-06-08 DIAGNOSIS — R8289 Other abnormal findings on cytological and histological examination of urine: Secondary | ICD-10-CM | POA: Diagnosis not present

## 2024-06-08 DIAGNOSIS — N5231 Erectile dysfunction following radical prostatectomy: Secondary | ICD-10-CM | POA: Diagnosis not present

## 2024-06-08 DIAGNOSIS — C674 Malignant neoplasm of posterior wall of bladder: Secondary | ICD-10-CM | POA: Diagnosis not present

## 2024-06-08 DIAGNOSIS — Z8546 Personal history of malignant neoplasm of prostate: Secondary | ICD-10-CM | POA: Diagnosis not present

## 2024-06-15 DIAGNOSIS — G4733 Obstructive sleep apnea (adult) (pediatric): Secondary | ICD-10-CM | POA: Diagnosis not present

## 2024-06-17 DIAGNOSIS — N393 Stress incontinence (female) (male): Secondary | ICD-10-CM | POA: Diagnosis not present

## 2024-06-17 DIAGNOSIS — N5231 Erectile dysfunction following radical prostatectomy: Secondary | ICD-10-CM | POA: Diagnosis not present

## 2024-06-17 DIAGNOSIS — Z8546 Personal history of malignant neoplasm of prostate: Secondary | ICD-10-CM | POA: Diagnosis not present

## 2024-06-24 DIAGNOSIS — U071 COVID-19: Secondary | ICD-10-CM | POA: Diagnosis not present

## 2024-06-24 DIAGNOSIS — R059 Cough, unspecified: Secondary | ICD-10-CM | POA: Diagnosis not present

## 2024-07-19 DIAGNOSIS — G4733 Obstructive sleep apnea (adult) (pediatric): Secondary | ICD-10-CM | POA: Diagnosis not present

## 2024-07-21 DIAGNOSIS — I10 Essential (primary) hypertension: Secondary | ICD-10-CM | POA: Diagnosis not present

## 2024-07-21 DIAGNOSIS — Z8546 Personal history of malignant neoplasm of prostate: Secondary | ICD-10-CM | POA: Diagnosis not present

## 2024-07-21 DIAGNOSIS — M109 Gout, unspecified: Secondary | ICD-10-CM | POA: Diagnosis not present

## 2024-07-21 DIAGNOSIS — R0789 Other chest pain: Secondary | ICD-10-CM | POA: Diagnosis not present

## 2024-07-22 ENCOUNTER — Other Ambulatory Visit: Payer: Self-pay | Admitting: Urology

## 2024-07-27 DIAGNOSIS — M519 Unspecified thoracic, thoracolumbar and lumbosacral intervertebral disc disorder: Secondary | ICD-10-CM | POA: Diagnosis not present

## 2024-07-27 DIAGNOSIS — M109 Gout, unspecified: Secondary | ICD-10-CM | POA: Diagnosis not present

## 2024-07-27 DIAGNOSIS — M50322 Other cervical disc degeneration at C5-C6 level: Secondary | ICD-10-CM | POA: Diagnosis not present

## 2024-07-27 DIAGNOSIS — I10 Essential (primary) hypertension: Secondary | ICD-10-CM | POA: Diagnosis not present

## 2024-08-16 ENCOUNTER — Ambulatory Visit: Admitting: Orthopedic Surgery

## 2024-08-16 ENCOUNTER — Other Ambulatory Visit (INDEPENDENT_AMBULATORY_CARE_PROVIDER_SITE_OTHER): Payer: Self-pay

## 2024-08-16 DIAGNOSIS — M5412 Radiculopathy, cervical region: Secondary | ICD-10-CM

## 2024-08-16 NOTE — Progress Notes (Addendum)
 Orthopedic Spine Surgery Office Note   Assessment: Patient is a 63 y.o. male with neck pain that radiates into the left shoulder. Has foraminal stenosis at C3/4 causing radiculopathy     Plan: -Patient has tried PT, tylenol , aleve , prednisone , lyrica , tizanidine, cervical ESI -Continue with tizanidine and Lyrica  for now for over a year without relief and his prior MRI is over a year old, so ordered a new MRI -If he is not doing any better at next visit, we will discuss a C3/4 ACDF as a treatment option -Patient should return to office in 1 month, x-rays at next visit: none     Patient expressed understanding of the plan and all questions were answered to the patient's satisfaction.    ___________________________________________________________________________     History:   Patient is a 63 y.o. male who presents today for follow up on his cervical spine.  Patient has now essentially completed all of his treatment for bladder cancer.  He has a reconstructive procedure coming up in November but after that he is done with treatment at this point.  He continues to have neck pain that radiates into his left shoulder.  He feels it going into the lateral aspect of the shoulder to the level of the mid humerus.  He is not having any pain more distal to that.  He is not noting any pain in the right upper extremity.  He has been using Lyrica  and tizanidine to control the pain.  The pain remains consistent and felt on a daily basis.  He notes the pain with activity and at rest.   Treatments tried:  PT, tylenol , aleve , prednisone , lyrica , tizanidine, cervical ESI     Physical Exam:   General: no acute distress, appears stated age Neurologic: alert, answering questions appropriately, following commands Respiratory: unlabored breathing on room air, symmetric chest rise Psychiatric: appropriate affect, normal cadence to speech     MSK (spine):   -Strength exam                                                    Left                  Right Grip strength                5/5                  5/5 Interosseus                  5/5                  5/5 Wrist extension            5/5                  5/5 Wrist flexion                 5/5                  5/5 Elbow flexion                5/5                  5/5 Deltoid  5/5                  5/5   -Sensory exam                           Sensation intact to light touch in C5-T1 nerve distributions of bilateral upper extremities   -Brachioradialis DTR: 2/4 on the left, 2/4 on the right -Biceps DTR: 2/4 on the left, 2/4 on the right   -Spurling: negative bilaterally -Hoffman sign: negative bilaterally -Clonus: no beats bilaterally -Interosseous wasting: none seen -Grip and release test: negative -Romberg: negative -Gait: normal   Left shoulder exam: no pain through range of motion Right shoulder exam: no pain through range of motion   Imaging: XRs of the cervical spine from 08/16/2024 were independently reviewed and interpreted, showing disc height loss with anterior osteophyte formation at C3/4.  There is more mild disc height loss and anterior osteophyte formation seen at C5/6.  No fracture or dislocation noted.  No evidence of instability on flexion/extension views.   MRI of the cervical spine from 04/24/2023 was previously independently reviewed and interpreted, showing bilateral foraminal stenosis at C3/4. Right sided foraminal stenosis at C5/6. No other stenosis seen. No T2 cord signal change seen.      Patient name: Anthony Anderson Patient MRN: [id number] Date of visit: 08/16/24

## 2024-08-18 DIAGNOSIS — G4733 Obstructive sleep apnea (adult) (pediatric): Secondary | ICD-10-CM | POA: Diagnosis not present

## 2024-08-23 ENCOUNTER — Ambulatory Visit (HOSPITAL_COMMUNITY)
Admission: RE | Admit: 2024-08-23 | Discharge: 2024-08-23 | Disposition: A | Discharge status: Home / Self Care | Source: Ambulatory Visit | Attending: Orthopedic Surgery | Admitting: Orthopedic Surgery

## 2024-08-23 DIAGNOSIS — M5412 Radiculopathy, cervical region: Secondary | ICD-10-CM | POA: Insufficient documentation

## 2024-08-23 DIAGNOSIS — M4722 Other spondylosis with radiculopathy, cervical region: Secondary | ICD-10-CM | POA: Diagnosis not present

## 2024-08-23 DIAGNOSIS — M4802 Spinal stenosis, cervical region: Secondary | ICD-10-CM | POA: Diagnosis not present

## 2024-08-23 DIAGNOSIS — M5011 Cervical disc disorder with radiculopathy,  high cervical region: Secondary | ICD-10-CM | POA: Diagnosis not present

## 2024-08-26 DIAGNOSIS — D225 Melanocytic nevi of trunk: Secondary | ICD-10-CM | POA: Diagnosis not present

## 2024-08-26 DIAGNOSIS — I872 Venous insufficiency (chronic) (peripheral): Secondary | ICD-10-CM | POA: Diagnosis not present

## 2024-08-26 DIAGNOSIS — Z1283 Encounter for screening for malignant neoplasm of skin: Secondary | ICD-10-CM | POA: Diagnosis not present

## 2024-08-26 DIAGNOSIS — D2371 Other benign neoplasm of skin of right lower limb, including hip: Secondary | ICD-10-CM | POA: Diagnosis not present

## 2024-08-26 DIAGNOSIS — D485 Neoplasm of uncertain behavior of skin: Secondary | ICD-10-CM | POA: Diagnosis not present

## 2024-08-30 ENCOUNTER — Encounter: Payer: Self-pay | Admitting: Radiology

## 2024-09-06 DIAGNOSIS — C61 Malignant neoplasm of prostate: Secondary | ICD-10-CM | POA: Diagnosis not present

## 2024-09-06 DIAGNOSIS — C678 Malignant neoplasm of overlapping sites of bladder: Secondary | ICD-10-CM | POA: Diagnosis not present

## 2024-09-06 DIAGNOSIS — N393 Stress incontinence (female) (male): Secondary | ICD-10-CM | POA: Diagnosis not present

## 2024-09-06 DIAGNOSIS — N5231 Erectile dysfunction following radical prostatectomy: Secondary | ICD-10-CM | POA: Diagnosis not present

## 2024-09-08 ENCOUNTER — Encounter (HOSPITAL_BASED_OUTPATIENT_CLINIC_OR_DEPARTMENT_OTHER): Payer: Self-pay | Admitting: Urology

## 2024-09-08 ENCOUNTER — Other Ambulatory Visit: Payer: Self-pay

## 2024-09-14 MED ORDER — VANCOMYCIN HCL 1500 MG/300ML IV SOLN
1500.0000 mg | INTRAVENOUS | Status: AC
  Administered 2024-09-15 (×2): 1500 mg via INTRAVENOUS
  Filled 2024-09-14: qty 300

## 2024-09-14 MED ORDER — GENTAMICIN SULFATE 40 MG/ML IJ SOLN
5.0000 mg/kg | INTRAVENOUS | Status: AC
  Administered 2024-09-15: 600 mg via INTRAVENOUS
  Filled 2024-09-14: qty 15

## 2024-09-14 MED ORDER — FLUCONAZOLE IN SODIUM CHLORIDE 200-0.9 MG/100ML-% IV SOLN
200.0000 mg | INTRAVENOUS | Status: DC
  Administered 2024-09-15: 400 mg via INTRAVENOUS
  Filled 2024-09-14: qty 100

## 2024-09-15 ENCOUNTER — Encounter (HOSPITAL_BASED_OUTPATIENT_CLINIC_OR_DEPARTMENT_OTHER)
Admission: RE | Disposition: A | Discharge status: Home / Self Care | Payer: Self-pay | Source: Home / Self Care | Attending: Urology

## 2024-09-15 ENCOUNTER — Ambulatory Visit (HOSPITAL_BASED_OUTPATIENT_CLINIC_OR_DEPARTMENT_OTHER)
Admission: RE | Admit: 2024-09-15 | Discharge: 2024-09-15 | Disposition: A | Discharge status: Home / Self Care | Attending: Urology | Admitting: Urology

## 2024-09-15 ENCOUNTER — Other Ambulatory Visit: Payer: Self-pay

## 2024-09-15 ENCOUNTER — Encounter (HOSPITAL_BASED_OUTPATIENT_CLINIC_OR_DEPARTMENT_OTHER): Payer: Self-pay | Admitting: Urology

## 2024-09-15 ENCOUNTER — Ambulatory Visit (HOSPITAL_BASED_OUTPATIENT_CLINIC_OR_DEPARTMENT_OTHER): Admitting: Anesthesiology

## 2024-09-15 DIAGNOSIS — N529 Male erectile dysfunction, unspecified: Secondary | ICD-10-CM | POA: Insufficient documentation

## 2024-09-15 DIAGNOSIS — I10 Essential (primary) hypertension: Secondary | ICD-10-CM | POA: Diagnosis not present

## 2024-09-15 DIAGNOSIS — G4730 Sleep apnea, unspecified: Secondary | ICD-10-CM | POA: Diagnosis not present

## 2024-09-15 DIAGNOSIS — Z6838 Body mass index (BMI) 38.0-38.9, adult: Secondary | ICD-10-CM | POA: Insufficient documentation

## 2024-09-15 DIAGNOSIS — Z01818 Encounter for other preprocedural examination: Secondary | ICD-10-CM

## 2024-09-15 DIAGNOSIS — N393 Stress incontinence (female) (male): Secondary | ICD-10-CM | POA: Diagnosis not present

## 2024-09-15 DIAGNOSIS — Z87891 Personal history of nicotine dependence: Secondary | ICD-10-CM | POA: Insufficient documentation

## 2024-09-15 DIAGNOSIS — K219 Gastro-esophageal reflux disease without esophagitis: Secondary | ICD-10-CM | POA: Insufficient documentation

## 2024-09-15 DIAGNOSIS — E669 Obesity, unspecified: Secondary | ICD-10-CM | POA: Diagnosis not present

## 2024-09-15 DIAGNOSIS — M1990 Unspecified osteoarthritis, unspecified site: Secondary | ICD-10-CM | POA: Diagnosis not present

## 2024-09-15 DIAGNOSIS — Z8551 Personal history of malignant neoplasm of bladder: Secondary | ICD-10-CM | POA: Diagnosis not present

## 2024-09-15 HISTORY — PX: URETHRAL SLING: SHX2621

## 2024-09-15 HISTORY — PX: PENILE PROSTHESIS IMPLANT: SHX240

## 2024-09-15 SURGERY — INSERTION, PENILE PROSTHESIS
Anesthesia: General | Site: Urethra

## 2024-09-15 MED ORDER — OXYCODONE HCL 5 MG PO TABS
ORAL_TABLET | ORAL | Status: AC
  Filled 2024-09-15: qty 1

## 2024-09-15 MED ORDER — TRANEXAMIC ACID-NACL 1000-0.7 MG/100ML-% IV SOLN
INTRAVENOUS | Status: AC
  Filled 2024-09-15: qty 100

## 2024-09-15 MED ORDER — LIDOCAINE HCL (PF) 1 % IJ SOLN
INTRAMUSCULAR | Status: AC
  Filled 2024-09-15: qty 90

## 2024-09-15 MED ORDER — IRRISEPT - 450ML BOTTLE WITH 0.05% CHG IN STERILE WATER, USP 99.95% OPTIME
TOPICAL | Status: DC | PRN
  Administered 2024-09-15: 900 mL
  Administered 2024-09-15: 450 mL

## 2024-09-15 MED ORDER — EPHEDRINE SULFATE (PRESSORS) 25 MG/5ML IV SOSY
PREFILLED_SYRINGE | INTRAVENOUS | Status: DC | PRN
  Administered 2024-09-15: 5 mg via INTRAVENOUS

## 2024-09-15 MED ORDER — PROPOFOL 500 MG/50ML IV EMUL
INTRAVENOUS | Status: AC
  Filled 2024-09-15: qty 50

## 2024-09-15 MED ORDER — MUPIROCIN 2 % EX OINT
1.0000 | TOPICAL_OINTMENT | Freq: Once | CUTANEOUS | Status: AC
  Administered 2024-09-15: 1 via NASAL

## 2024-09-15 MED ORDER — FENTANYL CITRATE (PF) 100 MCG/2ML IJ SOLN
INTRAMUSCULAR | Status: AC
  Filled 2024-09-15: qty 2

## 2024-09-15 MED ORDER — ACETAMINOPHEN 10 MG/ML IV SOLN
INTRAVENOUS | Status: DC | PRN
  Administered 2024-09-15: 1000 mg via INTRAVENOUS

## 2024-09-15 MED ORDER — ONDANSETRON HCL 4 MG/2ML IJ SOLN
INTRAMUSCULAR | Status: DC | PRN
  Administered 2024-09-15: 4 mg via INTRAVENOUS

## 2024-09-15 MED ORDER — ACETAMINOPHEN 500 MG PO TABS: 1000.0000 mg | ORAL_TABLET | Freq: Three times a day (TID) | ORAL | 0 refills | Status: AC

## 2024-09-15 MED ORDER — TRANEXAMIC ACID-NACL 1000-0.7 MG/100ML-% IV SOLN
INTRAVENOUS | Status: DC | PRN
  Administered 2024-09-15: 1000 mg via INTRAVENOUS

## 2024-09-15 MED ORDER — LACTATED RINGERS IV SOLN: INTRAVENOUS | Status: DC

## 2024-09-15 MED ORDER — MUPIROCIN 2 % EX OINT
TOPICAL_OINTMENT | CUTANEOUS | Status: AC
Start: 2024-09-15 — End: 2024-09-15
  Filled 2024-09-15: qty 22

## 2024-09-15 MED ORDER — OXYCODONE HCL 5 MG PO TABS: 5.0000 mg | ORAL_TABLET | Freq: Three times a day (TID) | ORAL | 0 refills | Status: AC | PRN

## 2024-09-15 MED ORDER — STERILE WATER FOR IRRIGATION IR SOLN
Status: DC | PRN
  Administered 2024-09-15: 1000 mL

## 2024-09-15 MED ORDER — MIDAZOLAM HCL (PF) 2 MG/2ML IJ SOLN
INTRAMUSCULAR | Status: DC | PRN
  Administered 2024-09-15: 2 mg via INTRAVENOUS

## 2024-09-15 MED ORDER — SULFAMETHOXAZOLE-TRIMETHOPRIM 800-160 MG PO TABS: 1.0000 | ORAL_TABLET | Freq: Two times a day (BID) | ORAL | 0 refills | Status: AC

## 2024-09-15 MED ORDER — SUGAMMADEX SODIUM 200 MG/2ML IV SOLN
INTRAVENOUS | Status: DC | PRN
  Administered 2024-09-15: 240 mg via INTRAVENOUS

## 2024-09-15 MED ORDER — LIDOCAINE HCL (CARDIAC) PF 100 MG/5ML IV SOSY
PREFILLED_SYRINGE | INTRAVENOUS | Status: DC | PRN
  Administered 2024-09-15: 100 mg via INTRAVENOUS

## 2024-09-15 MED ORDER — CELECOXIB 200 MG PO CAPS: 200.0000 mg | ORAL_CAPSULE | Freq: Two times a day (BID) | ORAL | 1 refills | Status: AC | PRN

## 2024-09-15 MED ORDER — FENTANYL CITRATE (PF) 100 MCG/2ML IJ SOLN
25.0000 ug | INTRAMUSCULAR | Status: DC | PRN
  Administered 2024-09-15: 50 ug via INTRAVENOUS

## 2024-09-15 MED ORDER — PROPOFOL 10 MG/ML IV BOLUS
INTRAVENOUS | Status: DC | PRN
  Administered 2024-09-15: 30 mg via INTRAVENOUS
  Administered 2024-09-15: 180 mg via INTRAVENOUS

## 2024-09-15 MED ORDER — BUPIVACAINE HCL (PF) 0.25 % IJ SOLN
INTRAMUSCULAR | Status: AC
Start: 2024-09-15 — End: 2024-09-15
  Filled 2024-09-15: qty 60

## 2024-09-15 MED ORDER — ACETAMINOPHEN 10 MG/ML IV SOLN: 1000.0000 mg | Freq: Once | INTRAVENOUS | Status: DC | PRN

## 2024-09-15 MED ORDER — FENTANYL CITRATE (PF) 100 MCG/2ML IJ SOLN
INTRAMUSCULAR | Status: DC | PRN
  Administered 2024-09-15: 100 ug via INTRAVENOUS
  Administered 2024-09-15 (×2): 50 ug via INTRAVENOUS

## 2024-09-15 MED ORDER — ACETAMINOPHEN 10 MG/ML IV SOLN
INTRAVENOUS | Status: AC
Start: 2024-09-15 — End: 2024-09-15
  Filled 2024-09-15: qty 100

## 2024-09-15 MED ORDER — OXYCODONE HCL 5 MG PO TABS
5.0000 mg | ORAL_TABLET | Freq: Once | ORAL | Status: AC | PRN
  Administered 2024-09-15: 5 mg via ORAL

## 2024-09-15 MED ORDER — LIDOCAINE HCL (PF) 1 % IJ SOLN
INTRAMUSCULAR | Status: DC | PRN
  Administered 2024-09-15: 20 mL

## 2024-09-15 MED ORDER — DEXMEDETOMIDINE HCL IN NACL 80 MCG/20ML IV SOLN
INTRAVENOUS | Status: AC
  Filled 2024-09-15: qty 20

## 2024-09-15 MED ORDER — TRANEXAMIC ACID 1000 MG/10ML IV SOLN
INTRAVENOUS | Status: AC
  Filled 2024-09-15: qty 10

## 2024-09-15 MED ORDER — DROPERIDOL 2.5 MG/ML IJ SOLN: 0.6250 mg | Freq: Once | INTRAMUSCULAR | Status: DC | PRN

## 2024-09-15 MED ORDER — OXYCODONE HCL 5 MG/5ML PO SOLN: 5.0000 mg | Freq: Once | ORAL | Status: AC | PRN

## 2024-09-15 MED ORDER — ROCURONIUM BROMIDE 100 MG/10ML IV SOLN
INTRAVENOUS | Status: DC | PRN
  Administered 2024-09-15: 70 mg via INTRAVENOUS
  Administered 2024-09-15 (×3): 10 mg via INTRAVENOUS
  Administered 2024-09-15: 20 mg via INTRAVENOUS
  Administered 2024-09-15: 10 mg via INTRAVENOUS

## 2024-09-15 MED ORDER — CHLORHEXIDINE GLUCONATE 4 % EX SOLN: Freq: Once | CUTANEOUS | Status: DC

## 2024-09-15 MED ORDER — DEXAMETHASONE SODIUM PHOSPHATE 4 MG/ML IJ SOLN
INTRAMUSCULAR | Status: DC | PRN
  Administered 2024-09-15: 5 mg via INTRAVENOUS

## 2024-09-15 MED ORDER — MIDAZOLAM HCL 2 MG/2ML IJ SOLN
INTRAMUSCULAR | Status: AC
  Filled 2024-09-15: qty 2

## 2024-09-15 MED ORDER — ROCURONIUM BROMIDE 10 MG/ML (PF) SYRINGE
PREFILLED_SYRINGE | INTRAVENOUS | Status: AC
  Filled 2024-09-15: qty 20

## 2024-09-15 MED ORDER — PHENYLEPHRINE HCL-NACL 20-0.9 MG/250ML-% IV SOLN
INTRAVENOUS | Status: DC | PRN
  Administered 2024-09-15: 40 ug/min via INTRAVENOUS

## 2024-09-15 MED ORDER — SODIUM CHLORIDE 0.9 % IR SOLN
Status: DC | PRN
  Administered 2024-09-15 (×2): 250 mL

## 2024-09-15 MED ORDER — BUPIVACAINE HCL (PF) 0.5 % IJ SOLN
INTRAMUSCULAR | Status: AC
  Filled 2024-09-15: qty 30

## 2024-09-15 MED ORDER — PHENYLEPHRINE HCL (PRESSORS) 10 MG/ML IV SOLN
INTRAVENOUS | Status: DC | PRN
  Administered 2024-09-15 (×4): 80 ug via INTRAVENOUS

## 2024-09-15 SURGICAL SUPPLY — 63 items
BAG DECANTER FOR FLEXI CONT (MISCELLANEOUS) IMPLANT
BLADE SURG 15 STRL LF DISP TIS (BLADE) ×2 IMPLANT
BNDG GAUZE DERMACEA FLUFF 4 (GAUZE/BANDAGES/DRESSINGS) ×2 IMPLANT
BRIEF MESH DISP 2XL (UNDERPADS AND DIAPERS) ×2 IMPLANT
BRIEF MESH DISP LRG (UNDERPADS AND DIAPERS) ×2 IMPLANT
CATH COUDE 5CC RIBBED (CATHETERS) ×2 IMPLANT
CHLORAPREP W/TINT 26 (MISCELLANEOUS) ×4 IMPLANT
COVER BACK TABLE 60X90IN (DRAPES) ×2 IMPLANT
COVER MAYO STAND STRL (DRAPES) ×4 IMPLANT
COVER SURGICAL LIGHT HANDLE (MISCELLANEOUS) ×2 IMPLANT
DERMABOND ADVANCED .7 DNX12 (GAUZE/BANDAGES/DRESSINGS) ×4 IMPLANT
DRAIN CHANNEL 10F 3/8 F FF (DRAIN) ×2 IMPLANT
DRAPE INCISE IOBAN 66X45 STRL (DRAPES) ×2 IMPLANT
DRAPE LAPAROTOMY 100X72 PEDS (DRAPES) ×2 IMPLANT
DRAPE UTILITY XL STRL (DRAPES) ×2 IMPLANT
DRSG TEGADERM 4X4.75 (GAUZE/BANDAGES/DRESSINGS) ×2 IMPLANT
EVACUATOR SILICONE 100CC (DRAIN) ×2 IMPLANT
GAUZE 4X4 16PLY ~~LOC~~+RFID DBL (SPONGE) ×4 IMPLANT
GAUZE SPONGE 4X4 12PLY STRL (GAUZE/BANDAGES/DRESSINGS) ×2 IMPLANT
GLOVE BIO SURGEON STRL SZ7 (GLOVE) ×2 IMPLANT
GLOVE BIOGEL M 7.0 STRL (GLOVE) ×2 IMPLANT
GLOVE BIOGEL PI IND STRL 7.0 (GLOVE) ×2 IMPLANT
GLOVE SURG SYN 7.0 PF PI (GLOVE) IMPLANT
GOWN STRL REUS W/ TWL XL LVL3 (GOWN DISPOSABLE) ×2 IMPLANT
GOWN STRL REUS W/TWL LRG LVL3 (GOWN DISPOSABLE) ×2 IMPLANT
GOWN STRL SURGICAL XL XLNG (GOWN DISPOSABLE) IMPLANT
IMPL SNAPCONE RTE CX 2.0 (Urological Implant) IMPLANT
IV 0.9% NACL 250 ML (IV SOLUTION) IMPLANT
KIT ACCESSORY AMS 700 PUMP (Urological Implant) IMPLANT
KIT BASIN OR (CUSTOM PROCEDURE TRAY) ×2 IMPLANT
KIT TURNOVER KIT A (KITS) ×2 IMPLANT
LAVAGE JET IRRISEPT WOUND (IRRIGATION / IRRIGATOR) ×4 IMPLANT
LUBRICANT JELLY K Y 4OZ (MISCELLANEOUS) ×2 IMPLANT
NDL HYPO 22X1.5 SAFETY MO (MISCELLANEOUS) ×2 IMPLANT
NDL SPNL 22GX3.5 QUINCKE BK (NEEDLE) ×2 IMPLANT
NEEDLE HYPO 22X1.5 SAFETY MO (MISCELLANEOUS) ×2 IMPLANT
NEEDLE SPNL 22GX3.5 QUINCKE BK (NEEDLE) ×2 IMPLANT
PACK BASIN DAY SURGERY FS (CUSTOM PROCEDURE TRAY) ×2 IMPLANT
PENCIL SMOKE EVACUATOR (MISCELLANEOUS) ×2 IMPLANT
PLUG CATH AND CAP STRL 200 (CATHETERS) ×2 IMPLANT
PROSTHESIS PENIL AMS 700 CX 24 (Urological Implant) IMPLANT
RESERVOIR FLAT IZ 100ML (Miscellaneous) IMPLANT
RETRACTOR DEEP SCROTAL PENILE (MISCELLANEOUS) IMPLANT
SET COLLECT BLD 21X.75 12 PB G (NEEDLE) ×2 IMPLANT
SET IRRIG Y-TYPE CYSTO (SET/KITS/TRAYS/PACK) ×2 IMPLANT
SLEEVE SCD COMPRESS KNEE MED (STOCKING) ×2 IMPLANT
SLING ADVANCE MALE SYSTEM (Mesh General) IMPLANT
SOLN STERILE WATER BTL 1000 ML (IV SOLUTION) ×2 IMPLANT
SPIKE FLUID TRANSFER (MISCELLANEOUS) IMPLANT
SUT ETHILON 3 0 PS 1 (SUTURE) ×2 IMPLANT
SUT MNCRL AB 4-0 PS2 18 (SUTURE) ×2 IMPLANT
SUT VIC AB 2-0 UR6 27 (SUTURE) ×8 IMPLANT
SUT VIC AB 3-0 SH 27X BRD (SUTURE) ×4 IMPLANT
SUT VIC AB 3-0 SH 27XBRD (SUTURE) ×8 IMPLANT
SYR 10ML LL (SYRINGE) ×6 IMPLANT
SYR 20ML LL LF (SYRINGE) ×2 IMPLANT
SYR 50ML LL SCALE MARK (SYRINGE) ×6 IMPLANT
SYR BULB IRRIG 60ML STRL (SYRINGE) ×2 IMPLANT
SYR CONTROL 10ML LL (SYRINGE) ×2 IMPLANT
TOWEL GREEN STERILE FF (TOWEL DISPOSABLE) ×4 IMPLANT
TRAY DSU PREP LF (CUSTOM PROCEDURE TRAY) ×2 IMPLANT
TUBE CONNECTING 20X1/4 (TUBING) ×2 IMPLANT
YANKAUER SUCT BULB TIP NO VENT (SUCTIONS) ×2 IMPLANT

## 2024-09-15 NOTE — Anesthesia Postprocedure Evaluation (Signed)
 Anesthesia Post Note  Patient: Alm DELENA Sick  Procedure(s) Performed: INSERTION, PENILE PROSTHESIS (Penis) CREATION, URETHRAL SLING, MALE (Urethra)     Patient location during evaluation: PACU Anesthesia Type: General Level of consciousness: awake and alert Pain management: pain level controlled Vital Signs Assessment: post-procedure vital signs reviewed and stable Respiratory status: spontaneous breathing, nonlabored ventilation, respiratory function stable and patient connected to nasal cannula oxygen Cardiovascular status: blood pressure returned to baseline and stable Postop Assessment: no apparent nausea or vomiting Anesthetic complications: no   No notable events documented.  Last Vitals:  Vitals:   09/15/24 1238 09/15/24 1307  BP: 117/77 119/73  Pulse: 67 72  Resp: 15 20  Temp:    SpO2: 93% 95%    Last Pain:  Vitals:   09/15/24 1223  TempSrc:   PainSc: 4                  Rome Ade

## 2024-09-15 NOTE — Transfer of Care (Signed)
 Immediate Anesthesia Transfer of Care Note  Patient: Anthony Anderson  Procedure(s) Performed: INSERTION, PENILE PROSTHESIS (Penis) CREATION, URETHRAL SLING, MALE (Urethra)  Patient Location: PACU  Anesthesia Type:General  Level of Consciousness: awake and patient cooperative  Airway & Oxygen Therapy: Patient Spontanous Breathing and Patient connected to nasal cannula oxygen  Post-op Assessment: Report given to RN and Post -op Vital signs reviewed and stable  Post vital signs: Reviewed and stable  Last Vitals:  Vitals Value Taken Time  BP 136/92 09/15/24 11:51  Temp 36.3 C 09/15/24 11:51  Pulse 76 09/15/24 11:52  Resp 19 09/15/24 11:52  SpO2 95 % 09/15/24 11:52  Vitals shown include unfiled device data.  Last Pain:  Vitals:   09/15/24 0715  TempSrc: Temporal  PainSc: 0-No pain      Patients Stated Pain Goal: 4 (09/15/24 0715)  Complications: No notable events documented.

## 2024-09-15 NOTE — Op Note (Signed)
 PATIENT:  Anthony Anderson  PRE-OPERATIVE DIAGNOSIS:  Organic erectile dysfunction  POST-OPERATIVE DIAGNOSIS:  Same  PROCEDURE:   3 piece inflatable penile prosthesis (BS/AMS) Insertion of mini-jupette male sling (advance XP used and cut to size) Injection of pharmacoagent into penis  SURGEON:  Herlene Foot MD  ASST: Maurilio Agar, MD  INDICATION: He has had long-standing organic erectile dysfunction and refractory to other modes of treatment. He has elected to proceed with prosthesis implantation.  ANESTHESIA:  General  EBL:  50 cc  Device: 3 piece AMS CX 700: 99 cc reservoir, 24 cm cylinders and 2 cm rear-tip extenders on right and left sides  LOCAL MEDICATIONS USED:   penile block done with 10 cc lidocaine /marcaine  mixture 10 cc injected into corpora directly via butterfly needle  SPECIMEN: None  Description of procedure: The patient was taken to the major operating room, placed on the table and administered general anesthesia in the supine position. His genitalia was then prepped with chlorhexidine  x 2. He was draped in the usual sterile fashion, and I used Ioban on the field. An official timeout was then performed.  A dorsal penile block was performed. A butterfly needle was then used to inject normal saline into the penis to give an artificial erection. There was no clinically significant curvature or deformity. I then injected 10 cc of lidocaine /marcaine  into the penis.   A 14 French coude catheter was then placed in the bladder and the bladder was drained and the catheter was plugged. A midline penoscrotal incision was then made and the dissection was carried down to the corpora and urethra. The lonestar retractor was positioned so as to have excellent exposure. 2-0 Vicryl sutures were then placed proximally in each corpus cavernosum to serve as stay sutures. An incision was then made in the corpus cavernosum first on the left-hand side with the bovie. Tamra were used to gently  dilate the opening. I then dilated the corpus cavernosum with the a 12 Fr brooks dilator distally and proximally. Field goal post tests were performed and there was no evidence of perforations or crossover. I then irrigated the corpus cavernosum with antibiotic solution and measured the distance proximally and distally from the stay suture and was found to be 14 and 11 cm, respectively.I then turned my attention to the contralateral corpus cavernosum and placed my stay sutures, made my corporotomy and dilated the corpus cavernosum in an identical fashion. This was measured and also was found to be 14 cm proximally and 11 distally. It was irrigated with anastomotic solution as was the scrotum. I then chose an 24 cm cylinder set with 2 cm rear-tip extenders  and these were prepped while I prepared the site for reservoir placement.  I then digitally probed into the Left external inguinal ring. My finger was used to poke through the posterior wall of the ring. I used my finger to poke through the conjoint tendon. I ensured I was in the appropriate space, and used a sponge stick to dissect cephalad and to clear room for the reservoir. I irrigated the space with anastomotic solution and then placed the reservoir in this location. I then filled the reservoir with 99 cc of sterile saline, and checked to confirm proper position. There was minimal backpressure with the reservoir max-filled.  Attention was redirected to the corporotomies where the cylinders were then placed by first fixing the suture to the distal aspect of the right cylinder to a straight needle. This was then loaded on the  Furlow inserter and passed through the corporotomy and distally. I then advanced the straight needle with the Furlow inserter out through the glans and this was grasped with a hemostat and pulled through the glans and the suture was secured with a hemostat. I then performed an identical maneuver on the contralateral side. After this  was performed I irrigated both corpus cavernosum; there was no evidence of urethral perforation. I inserted the distal portion of the cylinder through the corporotomies and pulled this to the end of the corpora with the suture. The proximal aspect with the rear-tip extender was then passed through the corporotomy and into the seated position on each side. I then connected reservoir tubing to a syringe filled with sterile saline and inflated the device. I noted a good straight erection with both cylinders equidistant under the glans and no buckling of the cylinders. I therefore deflated the device and closed the corporotomies with used my previously placed stay sutures.   I measured the distance between the two corporotomies and it was 3.5-4 cm. The mesh from an advance xp sling was cut to size. Once we ensured a proper fit, the tails from the stay sutures were used to secure it in place, spanning from corporotomy to corporotomy. We test inflated the implant and it seemed to be tensioned appropriately.  I then grasped the scrotal skin in the midline with a babcock, and used a hemostat to dissect down to the dependent-most portion of the scrotum. The nasal speculum was inserted into this space, and facilitated placement of the pump. The cylinder was then connected to the pump after excising the excess tubing with appropriate shodded hemostats in place and then I used the supplied connectors to make the connection. I then again cycled the device with the pump and it cycled properly. I deflated the device and pumped it up about three quarters of the way to aid with hemostasis. I irrigated the wound one last time with antibiotic irrigation and then closed the deep scrotal tissue over the tubing and pump with running 3-0 monocryl suture. I placed a 10 Fr blake drain over the corporotomies. A second layer was then closed over this first layer with running 3- 0 monocryl, and running skin suture w/ 4-0 monocryl  performed. Incision dressed with dermabond.  A mummy wrap was applied. The catheter was removed, and drain connected to suction bulb and the patient was awakened and taken recovery room in stable and satisfactory condition. He tolerated the procedure well and there were no intraoperative complications. Needle sponge and instrument counts were correct at the end of the operation.

## 2024-09-15 NOTE — H&P (Signed)
 H&P  History of Present Illness: Anthony Anderson is a 63 y.o. year old M who presents today for insertion of an inflatable penile prosthesis with Mini-jupette sling  No acute complaints  Past Medical History:  Diagnosis Date   Arthritis    Cancer (HCC)    prostate, currently watching for any changes   Cholelithiasis    Dyspnea 2015   Dysrhythmia    GERD (gastroesophageal reflux disease)    History of hiatal hernia    History of kidney stones    Hyperlipidemia    Hypertension    Kidney stones    Neuromuscular disorder (HCC)    cervical radiculopathy down both arms,   PSA elevation    Sleep apnea    uses CPAP    Past Surgical History:  Procedure Laterality Date   CARDIAC CATHETERIZATION     COLONOSCOPY WITH PROPOFOL  N/A 02/06/2022   Procedure: COLONOSCOPY WITH PROPOFOL ;  Surgeon: Eartha Angelia Sieving, MD;  Location: [facility];  Service: Gastroenterology;  Laterality: N/A;  10:00   CYSTOSCOPY W/ URETERAL STENT REMOVAL Right 02/24/2020   Procedure: CYSTOSCOPY WITH STENT REMOVAL;  Surgeon: Matilda Senior, MD;  Location: [facility];  Service: Urology;  Laterality: Right;  1 HR   CYSTOSCOPY WITH RETROGRADE PYELOGRAM, URETEROSCOPY AND STENT PLACEMENT Right 02/24/2020   Procedure: CYSTOSCOPY WITH RETROGRADE PYELOGRAM, URETEROSCOPY AND STENT PLACEMENT;  Surgeon: Matilda Senior, MD;  Location: [facility];  Service: Urology;  Laterality: Right;   CYSTOSCOPY WITH STENT PLACEMENT Right 02/07/2020   Procedure: CYSTOSCOPY RIGHT RETROGRADE PYLEOGRAM WITH RIGHT STENT PLACEMENT;  Surgeon: Elisabeth Valli BIRCH, MD;  Location: [facility];  Service: Urology;  Laterality: Right;   HOLMIUM LASER APPLICATION Right 02/24/2020   Procedure: HOLMIUM LASER APPLICATION;  Surgeon: Matilda Senior, MD;  Location: [facility];  Service: Urology;  Laterality: Right;   LEFT HEART CATHETERIZATION WITH CORONARY ANGIOGRAM N/A 10/26/2014   Procedure: LEFT HEART CATHETERIZATION WITH CORONARY ANGIOGRAM;  Surgeon: Victory LELON Claudene DOUGLAS, MD;  Location: [facility];  Service: Cardiovascular;  Laterality: N/A;   LITHOTRIPSY     MASS EXCISION Left 01/09/2018   Procedure: EXCISION SUBCUTANEOUS MASS, CHEST WALL;  Surgeon: Mavis Anes, MD;  Location: [facility];  Service: General;  Laterality: Left;   NASAL SINUS SURGERY     PELVIC LYMPH NODE DISSECTION Bilateral 09/11/2018   Procedure: PELVIC LYMPH NODE DISSECTION;  Surgeon: Cam Morene LELON, MD;  Location: [facility];  Service: Urology;  Laterality: Bilateral;   POLYPECTOMY  02/06/2022   Procedure: POLYPECTOMY;  Surgeon: Eartha Angelia Sieving, MD;  Location: [facility];  Service: Gastroenterology;;   ROBOT ASSISTED LAPAROSCOPIC RADICAL PROSTATECTOMY N/A 09/11/2018   Procedure: XI ROBOTIC ASSISTED LAPAROSCOPIC RADICAL PROSTATECTOMY;  Surgeon: Cam Morene LELON, MD;  Location: [facility];  Service: Urology;  Laterality: N/A;   TRANSURETHRAL RESECTION OF BLADDER TUMOR N/A 01/14/2024   Procedure: TURBT (TRANSURETHRAL RESECTION OF BLADDER TUMOR);  Surgeon: Lovie Arlyss CROME, MD;  Location: [facility];  Service: Urology;  Laterality: N/A;  30 MINUTES NEEDED FOR CASE    Home Medications:  Current Meds  Medication Sig   allopurinol  (ZYLOPRIM ) 100 MG tablet Take 100 mg by mouth daily.   Artificial Tear Solution (SOOTHE XP OP) Place 1 drop into both eyes at bedtime as needed (for dry eyes).   atorvastatin  (LIPITOR) 40 MG tablet Take 40 mg by mouth daily.   colchicine 0.6 MG tablet Take 0.6 mg by mouth 2 (two) times daily as needed (gout).   ibuprofen (IBU)  400 MG tablet Take 400 mg by mouth in the morning. Additional 400 mg if needed for the pain   metoprolol  succinate (TOPROL -XL) 25 MG 24 hr tablet Take 25 mg by mouth at bedtime.    Multiple Vitamin (MULTIVITAMIN WITH MINERALS) TABS tablet Take 1 tablet by mouth daily.   olmesartan (BENICAR) 40 MG tablet Take 40 mg by mouth daily.   pantoprazole  (PROTONIX ) 40 MG tablet Take 40 mg by mouth daily.   pregabalin  (LYRICA ) 75 MG capsule  TAKE ONE CAPSULE BY MOUTH TWICE DAILY (Patient taking differently: Take 75 mg by mouth in the morning.)   tiZANidine (ZANAFLEX) 4 MG tablet Take 4 mg by mouth at bedtime.   VASCEPA 1 g capsule Take 2 g by mouth in the morning.    Allergies: No Known Allergies  Family History  Problem Relation Age of Onset   CAD Mother    CAD Father    Prostate cancer Father     Social History:  reports that he quit smoking about 42 years ago. His smoking use included cigarettes. He started smoking about 52 years ago. He has a 10 pack-year smoking history. He has been exposed to tobacco smoke. He has never used smokeless tobacco. He reports that he does not drink alcohol  and does not use drugs.  ROS: A complete review of systems was performed.  All systems are negative except for pertinent findings as noted.  Physical Exam:  Vital signs in last 24 hours: Temp:  [98.4 F (36.9 C)] 98.4 F (36.9 C) (11/19 0715) Pulse Rate:  [66] 66 (11/19 0715) Resp:  [17] 17 (11/19 0715) BP: (145)/(82) 145/82 (11/19 0715) SpO2:  [100 %] 100 % (11/19 0715) Weight:  [120.4 kg] 120.4 kg (11/19 0715) Constitutional:  Alert and oriented, No acute distress Cardiovascular: Regular rate and rhythm Respiratory: Normal respiratory effort, Lungs clear bilaterally GI: Abdomen is soft, nontender, nondistended, no abdominal masses Lymphatic: No lymphadenopathy Neurologic: Grossly intact, no focal deficits Psychiatric: Normal mood and affect   Laboratory Data:  No results for input(s): WBC, HGB, HCT, PLT in the last 72 hours.  No results for input(s): NA, K, CL, GLUCOSE, BUN, CALCIUM , CREATININE in the last 72 hours.  Invalid input(s): CO3   No results found for this or any previous visit (from the past 24 hours). No results found for this or any previous visit (from the past 240 hours).  Renal Function: No results for input(s): CREATININE in the last 168 hours. CrCl cannot be calculated  (Patient's most recent lab result is older than the maximum 21 days allowed.).  Radiologic Imaging: No results found.  Assessment:  Anthony Anderson is a 63 y.o. year old M with ED refractory to other medical treatments, SUI  Plan:  To OR as planned for IPP with mini jupette sling. Procedure and risks reviewed, including but not limited to bleeding, infection, implant infection, implant malfunction, implant malplacement, erosion, damage to adjacent structures, pain, urinary retention, persistent SUI, pain. All questions answered   Herlene Foot, MD 09/15/2024, 8:50 AM  [facility] Pager: [phone]

## 2024-09-15 NOTE — Anesthesia Procedure Notes (Signed)
 Procedure Name: Intubation Date/Time: 09/15/2024 9:09 AM  Performed by: Donnell Berwyn SQUIBB, CRNAPre-anesthesia Checklist: Patient identified, Emergency Drugs available, Suction available, Patient being monitored and Timeout performed Patient Re-evaluated:Patient Re-evaluated prior to induction Oxygen Delivery Method: Circle system utilized Preoxygenation: Pre-oxygenation with 100% oxygen Induction Type: IV induction Ventilation: Mask ventilation without difficulty Laryngoscope Size: Mac and 4 Grade View: Grade I Tube type: Oral Tube size: 7.5 mm Number of attempts: 1 Airway Equipment and Method: Stylet Placement Confirmation: ETT inserted through vocal cords under direct vision, positive ETCO2 and breath sounds checked- equal and bilateral Secured at: 23 cm Tube secured with: Tape Dental Injury: Teeth and Oropharynx as per pre-operative assessment

## 2024-09-15 NOTE — Anesthesia Preprocedure Evaluation (Signed)
 Anesthesia Evaluation  Patient identified by MRN, date of birth, ID band Patient awake    Reviewed: Allergy & Precautions, NPO status , Patient's Chart, lab work & pertinent test results, reviewed documented beta blocker date and time   Airway Mallampati: III  TM Distance: >3 FB Neck ROM: Full    Dental  (+) Dental Advisory Given, Partial Upper,    Pulmonary sleep apnea and Continuous Positive Airway Pressure Ventilation , former smoker   Pulmonary exam normal breath sounds clear to auscultation       Cardiovascular hypertension, Pt. on home beta blockers and Pt. on medications Normal cardiovascular exam Rhythm:Regular Rate:Normal     Neuro/Psych  Neuromuscular disease  negative psych ROS   GI/Hepatic Neg liver ROS, hiatal hernia,GERD  Medicated,,  Endo/Other  Obesity   Renal/GU Renal InsufficiencyRenal disease   Bladder cancer     Musculoskeletal  (+) Arthritis ,    Abdominal  (+) + obese  Peds  Hematology negative hematology ROS (+)   Anesthesia Other Findings Past Medical History: No date: Arthritis No date: Cancer [facility])     Comment:  prostate, currently watching for any changes No date: Cholelithiasis 2015: Dyspnea No date: Dysrhythmia No date: GERD (gastroesophageal reflux disease) No date: History of hiatal hernia No date: History of kidney stones No date: Hyperlipidemia No date: Hypertension No date: Kidney stones No date: Neuromuscular disorder (HCC)     Comment:  cervical radiculopathy down both arms, No date: PSA elevation No date: Sleep apnea     Comment:  uses CPAP   Reproductive/Obstetrics                              Anesthesia Physical Anesthesia Plan  ASA: 3  Anesthesia Plan: General   Post-op Pain Management: Ofirmev  IV (intra-op)*   Induction: Intravenous  PONV Risk Score and Plan: 3 and Midazolam , Dexamethasone  and Ondansetron   Airway Management  Planned: Oral ETT  Additional Equipment: None  Intra-op Plan:   Post-operative Plan: Extubation in OR  Informed Consent: I have reviewed the patients History and Physical, chart, labs and discussed the procedure including the risks, benefits and alternatives for the proposed anesthesia with the patient or authorized representative who has indicated his/her understanding and acceptance.     Dental advisory given  Plan Discussed with: CRNA  Anesthesia Plan Comments: (Discussed risks of anesthesia with patient, including PONV, sore throat, lip/dental/eye damage. Rare risks discussed as well, such as cardiorespiratory and neurological sequelae, and allergic reactions. Discussed the role of CRNA in patient's perioperative care. Patient understands.)         Anesthesia Quick Evaluation

## 2024-09-15 NOTE — Discharge Instructions (Addendum)
 Penile prosthesis postoperative instructions  Wound:  In most cases your incision will have absorbable sutures that will dissolve within the first 10-20 days. Some will fall out even earlier. Expect some redness as the sutures dissolved but this should occur only around the sutures. If there is generalized redness, especially with increasing pain or swelling, let us  know. The scrotum and penis will very likely get black and blue as the blood in the tissues spread. Sometimes the whole scrotum will turn colors. The black and blue is followed by a yellow and brown color. In time, all the discoloration will go away. In some cases some firm swelling in the area of the testicle and pump may persist for up to 4-6 weeks after the surgery and is considered normal in most cases.  Drain:   You may be discharged home with a drain in place. If so, you will be taught how to empty it and should keep track of the output. Additionally, you should call the office to arrange for an appointment to have it removed after a few days.   Diet:  You may return to your normal diet within 24 hours following your surgery. You may note some mild nausea and possibly vomiting the first 6-8 hours following surgery. This is usually due to the side effects of anesthesia, and will disappear quite soon. I would suggest clear liquids and a very light meal the first evening following your surgery.  Activity:  Your physical activity should be restricted the first 48 hours. During that time you should remain relatively inactive, moving about only when necessary. During the first 3 weeks following surgery you should avoid lifting any heavy objects (anything greater than 15 pounds), and avoid strenuous exercise. If you work, ask us  specifically about your restrictions, both for work and home. We will write a note to your employer if needed.  Avoid using your penis until your follow up visit with Dr Lovie, which will typically be around  3-4 weeks following the surgery. Most people are able to start cycling their device after that appointment, and can have intercourse soon thereafter.   You should plan to wear a tight pair of jockey shorts or an athletic supporter for the first 4-5 days, even to sleep. This will keep the scrotum immobilized to some degree and keep the swelling down.The position of your penis will determine what is most comfortable but I strongly urge you to keep the penis in the up position (toward your head). You should continue to tuck up your penis when possible for the first 3 months following surgery.  Ice packs should be placed on and off over the scrotum for the first 48 hours. Frozen peas or corn in a ZipLock bag can be frozen, used and re-frozen. Fifteen minutes on and 15 minutes off is a reasonable schedule. The ice is a good pain reliever and keeps the swelling down.  Hygiene:  You may shower 48 hours after your surgery. Tub bathing should be restricted until the wound is completely healed, typically around 2-3 weeks.  Medication:  You will be sent home with some type of pain medication. In many cases you will be sent home with a strong anti-inflammatory medication (Celebrex, Meloxicam) and a narcotic pain pill (hydrocodone  or oxycodone ). You can also supplement these medications with tylenol  (acetaminophen ). If the pain medication you are sent home with does not control the pain, please notify the office Problems you should report to us :  Fever of 101.0 degrees  Fahrenheit or greater. Moderate or severe swelling under the skin incision or involving the scrotum. Drug reaction such as hives, a rash, nausea or vomiting.   Post Anesthesia Home Care Instructions  Activity: Get plenty of rest for the remainder of the day. A responsible individual must stay with you for 24 hours following the procedure.  For the next 24 hours, DO NOT: -Drive a car -Advertising copywriter -Drink alcoholic  beverages -Take any medication unless instructed by your physician -Make any legal decisions or sign important papers.  Meals: Start with liquid foods such as gelatin or soup. Progress to regular foods as tolerated. Avoid greasy, spicy, heavy foods. If nausea and/or vomiting occur, drink only clear liquids until the nausea and/or vomiting subsides. Call your physician if vomiting continues.  Special Instructions/Symptoms: Your throat may feel dry or sore from the anesthesia or the breathing tube placed in your throat during surgery. If this causes discomfort, gargle with warm salt water . The discomfort should disappear within 24 hours.  About my Jackson-Pratt Bulb Drain  What is a Jackson-Pratt bulb? A Jackson-Pratt is a soft, round device used to collect drainage. It is connected to a long, thin drainage catheter, which is held in place by one or two small stiches near your surgical incision site. When the bulb is squeezed, it forms a vacuum, forcing the drainage to empty into the bulb.  Emptying the Jackson-Pratt bulb- To empty the bulb: 1. Release the plug on the top of the bulb. 2. Pour the bulb's contents into a measuring container which your nurse will provide. 3. Record the time emptied and amount of drainage. Empty the drain(s) as often as your     doctor or nurse recommends.  Date                  Time                    Amount (Drain 1)                 Amount (Drain  2)  _____________________________________________________________________  _____________________________________________________________________  _____________________________________________________________________  _____________________________________________________________________  _____________________________________________________________________  _____________________________________________________________________  _____________________________________________________________________  _____________________________________________________________________  Squeezing the Jackson-Pratt Bulb- To squeeze the bulb: 1. Make sure the plug at the top of the bulb is open. 2. Squeeze the bulb tightly in your fist. You will hear air squeezing from the bulb. 3. Replace the plug while the bulb is squeezed. 4. Use a safety pin to attach the bulb to your clothing. This will keep the catheter from     pulling at the bulb insertion site.  When to call your doctor- Call your doctor if: Drain site becomes red, swollen or hot. You have a fever greater than 101 degrees F. There is oozing at the drain site. Drain falls out (apply a guaze bandage over the drain hole and secure it with tape). Drainage increases daily not related to activity patterns. (You will usually have more drainage when you are active than when you are resting.) Drainage has a bad odor.  Next dose of tylenol  will be after 4:30pm

## 2024-09-16 ENCOUNTER — Encounter (HOSPITAL_BASED_OUTPATIENT_CLINIC_OR_DEPARTMENT_OTHER): Payer: Self-pay | Admitting: Urology

## 2024-09-18 DIAGNOSIS — G4733 Obstructive sleep apnea (adult) (pediatric): Secondary | ICD-10-CM | POA: Diagnosis not present

## 2024-09-20 ENCOUNTER — Ambulatory Visit: Admitting: Orthopedic Surgery

## 2024-09-27 DIAGNOSIS — D485 Neoplasm of uncertain behavior of skin: Secondary | ICD-10-CM | POA: Diagnosis not present

## 2024-09-27 DIAGNOSIS — L988 Other specified disorders of the skin and subcutaneous tissue: Secondary | ICD-10-CM | POA: Diagnosis not present

## 2024-10-11 ENCOUNTER — Other Ambulatory Visit: Payer: Self-pay | Admitting: Orthopedic Surgery

## 2024-10-11 DIAGNOSIS — N393 Stress incontinence (female) (male): Secondary | ICD-10-CM | POA: Diagnosis not present

## 2024-10-11 DIAGNOSIS — N5201 Erectile dysfunction due to arterial insufficiency: Secondary | ICD-10-CM | POA: Diagnosis not present

## 2024-11-01 ENCOUNTER — Encounter: Payer: Self-pay | Admitting: Orthopedic Surgery

## 2024-11-01 ENCOUNTER — Ambulatory Visit (INDEPENDENT_AMBULATORY_CARE_PROVIDER_SITE_OTHER): Admitting: Orthopedic Surgery

## 2024-11-01 VITALS — BP 137/86 | HR 64 | Ht 70.0 in | Wt 265.4 lb

## 2024-11-01 DIAGNOSIS — M5412 Radiculopathy, cervical region: Secondary | ICD-10-CM

## 2024-11-01 NOTE — Progress Notes (Signed)
 Orthopedic Spine Surgery Office Note   Assessment: Patient is a 64 y.o. male with neck pain that radiates into the left shoulder/lateral arm. Has foraminal stenosis at C3/4 causing radiculopathy     Plan: -Patient has tried PT, tylenol , aleve , prednisone , lyrica , tizanidine, cervical ESI -Patient wanted to hold off on ACDF for changing his treatment as he is still undergoing immunotherapy.  I did explain that his symptoms are present for longer periods of time, the outcomes are not as good with surgical decompression but there still is improvement -Patient should return to office in 3 months, x-rays at next visit: none     Patient expressed understanding of the plan and all questions were answered to the patient's satisfaction.    ___________________________________________________________________________     History:   Patient is a 64 y.o. male who presents today for follow up on his cervical spine.  Patient continues to have neck and left upper extremity pain.  Feels that over the lateral aspect of the shoulder and arm to about the distal one third of the humerus.  No pain radiating past that point.  No pain in the right upper extremity.  He has been using the Lyrica  and tizanidine which has been helpful.  He is still undergoing further immunotherapy and is due to have a repeat scope to evaluate his bladder cancer in about 2 months.  Treatments tried:  PT, tylenol , aleve , prednisone , lyrica , tizanidine, cervical ESI     Physical Exam:   General: no acute distress, appears stated age Neurologic: alert, answering questions appropriately, following commands Respiratory: unlabored breathing on room air, symmetric chest rise Psychiatric: appropriate affect, normal cadence to speech     MSK (spine):   -Strength exam                                                   Left                  Right Grip strength                5/5                  5/5 Interosseus                  5/5                   5/5 Wrist extension            5/5                  5/5 Wrist flexion                 5/5                  5/5 Elbow flexion                5/5                  5/5 Deltoid                          5/5                  5/5   -Sensory exam  Sensation intact to light touch in C5-T1 nerve distributions of bilateral upper extremities   Left shoulder exam: no pain through range of motion Right shoulder exam: no pain through range of motion   Imaging: XRs of the cervical spine from 08/16/2024 were independently reviewed and interpreted, showing disc height loss with anterior osteophyte formation at C3/4.  There is more mild disc height loss and anterior osteophyte formation seen at C5/6.  No fracture or dislocation noted.  No evidence of instability on flexion/extension views.  MRI of the cervical spine from 08/23/2024 was independently reviewed and interpreted, showing bilateral foraminal stenosis at C3/4.  Right-sided foraminal stenosis seen at C5/6.  No other significant stenosis seen.  No T2 cord signal change seen.       Patient name: Anthony Anderson Patient MRN: [id number] Date of visit: 11/01/2024

## 2025-01-31 ENCOUNTER — Ambulatory Visit: Admitting: Orthopedic Surgery
